# Patient Record
Sex: Female | Born: 1985 | Race: Black or African American | Hispanic: No | Smoking: Never smoker
Health system: Southern US, Community
[De-identification: ages and names within clinical notes are randomized; demographics above are authoritative.]

## PROBLEM LIST (undated history)

## (undated) ENCOUNTER — Ambulatory Visit: Admission: EM

## (undated) ENCOUNTER — Inpatient Hospital Stay (HOSPITAL_COMMUNITY): Payer: Self-pay

## (undated) DIAGNOSIS — K567 Ileus, unspecified: Secondary | ICD-10-CM

## (undated) DIAGNOSIS — N390 Urinary tract infection, site not specified: Secondary | ICD-10-CM

## (undated) DIAGNOSIS — A749 Chlamydial infection, unspecified: Secondary | ICD-10-CM

## (undated) DIAGNOSIS — Z3490 Encounter for supervision of normal pregnancy, unspecified, unspecified trimester: Secondary | ICD-10-CM

## (undated) HISTORY — PX: ABDOMINAL SURGERY: SHX537

---

## 2006-07-19 ENCOUNTER — Emergency Department (HOSPITAL_COMMUNITY): Admission: EM | Admit: 2006-07-19 | Discharge: 2006-07-19 | Payer: Self-pay | Admitting: Family Medicine

## 2008-07-02 ENCOUNTER — Emergency Department (HOSPITAL_COMMUNITY): Admission: EM | Admit: 2008-07-02 | Discharge: 2008-07-02 | Payer: Self-pay | Admitting: Family Medicine

## 2009-01-12 ENCOUNTER — Emergency Department (HOSPITAL_COMMUNITY): Admission: EM | Admit: 2009-01-12 | Discharge: 2009-01-12 | Payer: Self-pay | Admitting: Family Medicine

## 2009-04-02 ENCOUNTER — Inpatient Hospital Stay (HOSPITAL_COMMUNITY): Admission: AD | Admit: 2009-04-02 | Discharge: 2009-04-04 | Payer: Self-pay | Admitting: Obstetrics

## 2009-04-02 ENCOUNTER — Encounter (INDEPENDENT_AMBULATORY_CARE_PROVIDER_SITE_OTHER): Payer: Self-pay | Admitting: Obstetrics

## 2010-08-16 ENCOUNTER — Emergency Department (HOSPITAL_COMMUNITY): Admission: EM | Admit: 2010-08-16 | Discharge: 2010-08-16 | Payer: Self-pay | Admitting: Emergency Medicine

## 2010-12-28 LAB — CBC
HCT: 31.9 % — ABNORMAL LOW (ref 36.0–46.0)
HCT: 36 % (ref 36.0–46.0)
Hemoglobin: 12.4 g/dL (ref 12.0–15.0)
MCHC: 33.8 g/dL (ref 30.0–36.0)
MCHC: 34.6 g/dL (ref 30.0–36.0)
MCV: 96.2 fL (ref 78.0–100.0)
MCV: 97.4 fL (ref 78.0–100.0)
Platelets: 203 10*3/uL (ref 150–400)
RDW: 13.4 % (ref 11.5–15.5)
RDW: 13.6 % (ref 11.5–15.5)
WBC: 12.6 10*3/uL — ABNORMAL HIGH (ref 4.0–10.5)

## 2011-06-22 LAB — GC/CHLAMYDIA PROBE AMP, GENITAL
Chlamydia, DNA Probe: NEGATIVE
GC Probe Amp, Genital: NEGATIVE

## 2011-06-22 LAB — POCT URINALYSIS DIP (DEVICE)
Glucose, UA: NEGATIVE
Glucose, UA: NEGATIVE
Hgb urine dipstick: NEGATIVE
Specific Gravity, Urine: 1.015
Specific Gravity, Urine: 1.015
Urobilinogen, UA: 1
Urobilinogen, UA: 1

## 2011-06-22 LAB — WET PREP, GENITAL: Trich, Wet Prep: NONE SEEN

## 2011-10-12 ENCOUNTER — Emergency Department (INDEPENDENT_AMBULATORY_CARE_PROVIDER_SITE_OTHER): Payer: Self-pay

## 2011-10-12 ENCOUNTER — Encounter (HOSPITAL_COMMUNITY): Payer: Self-pay | Admitting: *Deleted

## 2011-10-12 ENCOUNTER — Emergency Department (INDEPENDENT_AMBULATORY_CARE_PROVIDER_SITE_OTHER)
Admission: EM | Admit: 2011-10-12 | Discharge: 2011-10-12 | Disposition: A | Discharge status: Home / Self Care | Payer: Self-pay | Source: Home / Self Care | Attending: Emergency Medicine | Admitting: Emergency Medicine

## 2011-10-12 DIAGNOSIS — K5900 Constipation, unspecified: Secondary | ICD-10-CM

## 2011-10-12 DIAGNOSIS — R109 Unspecified abdominal pain: Secondary | ICD-10-CM

## 2011-10-12 HISTORY — DX: Ileus, unspecified: K56.7

## 2011-10-12 LAB — POCT URINALYSIS DIP (DEVICE)
Nitrite: NEGATIVE
Protein, ur: NEGATIVE mg/dL
Urobilinogen, UA: 1 mg/dL (ref 0.0–1.0)

## 2011-10-12 LAB — POCT PREGNANCY, URINE: Preg Test, Ur: NEGATIVE

## 2011-10-12 MED ORDER — GI COCKTAIL ~~LOC~~
30.0000 mL | Freq: Once | ORAL | Status: AC
  Administered 2011-10-12: 30 mL via ORAL

## 2011-10-12 MED ORDER — LACTULOSE 10 GM/15ML PO SOLN: 10.0000 g | Freq: Three times a day (TID) | ORAL | Status: AC

## 2011-10-12 MED ORDER — GI COCKTAIL ~~LOC~~
ORAL | Status: AC
  Filled 2011-10-12: qty 30

## 2011-10-12 NOTE — ED Provider Notes (Signed)
History     CSN: [id number]  Arrival date & time 10/12/11  1131   First MD Initiated Contact with Patient 10/12/11 1158      Chief Complaint  Patient presents with  . Chest Pain    (Consider location/radiation/quality/duration/timing/severity/associated sxs/prior treatment) HPI Comments: For 3 days feel "pressure and dullness " (poinst to LUQ area and L rib cage) NO COUGH AND NO sob, no fevers, no vomiting".   Have felt constipated, TRIED LAXATIVES YESTErDAY DID A BIT, STILL FEELS LIKE I NEED TO GO"  Patient is a 26 y.o. female presenting with chest pain. The history is provided by the patient.  Chest Pain The chest pain began 2 days ago. Chest pain occurs constantly. The chest pain is resolved. The pain is associated with coughing and lifting. At its most intense, the pain is at 7/10. The severity of the pain is moderate. The quality of the pain is described as aching and dull. The pain does not radiate. Chest pain is worsened by certain positions and deep breathing. Primary symptoms include abdominal pain. Pertinent negatives for primary symptoms include no fever, no fatigue, no shortness of breath, no cough, no nausea, no vomiting and no dizziness. Treatments tried: laxative.     Past Medical History  Diagnosis Date  . Ileus     Past Surgical History  Procedure Date  . Abdominal surgery     History reviewed. No pertinent family history.  History  Substance Use Topics  . Smoking status: Never Smoker   . Smokeless tobacco: Not on file  . Alcohol Use: No    OB History    Grav Para Term Preterm Abortions TAB SAB Ect Mult Living                  Review of Systems  Constitutional: Negative for fever and fatigue.  Respiratory: Negative for cough and shortness of breath.   Cardiovascular: Positive for chest pain.  Gastrointestinal: Positive for abdominal pain. Negative for nausea and vomiting.  Neurological: Negative for dizziness.    Allergies  Review of  patient's allergies indicates no known allergies.  Home Medications   Current Outpatient Rx  Name Route Sig Dispense Refill  . LACTULOSE 10 GM/15ML PO SOLN Oral Take 15 mLs (10 g total) by mouth 3 (three) times daily. 120 mL 0    BP 107/67  Pulse 86  Temp(Src) 98.3 F (36.8 C) (Oral)  Resp 16  SpO2 100%  LMP 09/22/2011  Physical Exam  Nursing note and vitals reviewed. Constitutional: She appears well-developed and well-nourished. No distress. She is not intubated.  HENT:  Head: Atraumatic.  Eyes: Conjunctivae are normal. No scleral icterus.  Pulmonary/Chest: Breath sounds normal. Accessory muscle usage present. Not tachypneic and not bradypneic. She is not intubated. No respiratory distress. She has no decreased breath sounds. She has no wheezes. She has no rhonchi. She has no rales.  Abdominal: Soft. Bowel sounds are normal. She exhibits no distension and no mass. There is tenderness. There is no rebound and no guarding.  Skin: No erythema.    ED Course  Procedures (including critical care time)  Labs Reviewed  POCT URINALYSIS DIP (DEVICE) - Abnormal; Notable for the following:    Hgb urine dipstick TRACE (*)    Leukocytes, UA LARGE (*) Biochemical Testing Only. Please order routine urinalysis from main lab if confirmatory testing is needed.   All other components within normal limits  POCT PREGNANCY, URINE  POCT PREGNANCY, URINE  POCT URINALYSIS DIPSTICK  URINE CULTURE   Dg Chest 2 View  10/12/2011  *RADIOLOGY REPORT*  Clinical Data: Pain  CHEST - 2 VIEW  Comparison: None  Findings: The heart size and mediastinal contours are within normal limits.  Both lungs are clear.  The visualized skeletal structures are unremarkable.  IMPRESSION: Negative exam.  Original Report Authenticated By: Rosealee Albee, M.D.   Dg Abd 1 View  10/12/2011  *RADIOLOGY REPORT*  Clinical Data: Abdominal pain  ABDOMEN - 1 VIEW  Comparison: None.  Findings: Moderate stool in the ascending colon.   No disproportionate dilatation of small bowel.  Phleboliths project over the left side of the pelvis.  Mild levoscoliosis with the apex at L3-4.  No obvious free intraperitoneal gas. 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  IMPRESSION: Nonobstructive bowel gas pattern.  Original Report Authenticated By: Donavan Burnet, M.D.     1. Abdominal pain   2. Constipation       MDM  Left sided abdominal discomfort- Denies urinary symptoms admits to feeling constipated for a few days tried over the counter laxative (can't remember ?) afebrile- soft abdomen, radiologically moderate stool burden-        Jimmie Molly, MD 10/12/11 1515

## 2011-10-12 NOTE — ED Notes (Signed)
Pt states no improvement from GI cocktail

## 2011-10-12 NOTE — ED Notes (Signed)
Pt reports 3 days  Of constant "soreness" of the lower sternum and lower anterior ribs.  She denies recent injury or  URI/coughing.   Massage helps the pain

## 2011-10-15 ENCOUNTER — Emergency Department (INDEPENDENT_AMBULATORY_CARE_PROVIDER_SITE_OTHER)
Admission: EM | Admit: 2011-10-15 | Discharge: 2011-10-15 | Disposition: A | Discharge status: Home / Self Care | Payer: Self-pay | Source: Home / Self Care | Attending: Emergency Medicine | Admitting: Emergency Medicine

## 2011-10-15 ENCOUNTER — Emergency Department (INDEPENDENT_AMBULATORY_CARE_PROVIDER_SITE_OTHER): Payer: Self-pay

## 2011-10-15 ENCOUNTER — Encounter (HOSPITAL_COMMUNITY): Payer: Self-pay

## 2011-10-15 DIAGNOSIS — R071 Chest pain on breathing: Secondary | ICD-10-CM

## 2011-10-15 DIAGNOSIS — R0789 Other chest pain: Secondary | ICD-10-CM

## 2011-10-15 MED ORDER — NAPROXEN 500 MG PO TABS: 500.0000 mg | ORAL_TABLET | Freq: Two times a day (BID) | ORAL | Status: DC

## 2011-10-15 NOTE — ED Notes (Signed)
Patient states she had a hard time sleeping last night, was tossing and turning. One time when she woke, she noted she was having pain in her chest w breathing. Denies any type of trauma to her chest, denies any pain or injury to her legs or calves. C/o only of pain in her chest w breathing; pain not increased w direct pressure to chest or  ROM upper extremities; NAD at present

## 2011-10-15 NOTE — ED Provider Notes (Signed)
History     CSN: [id number]  Arrival date & time 10/15/11  1516   First MD Initiated Contact with Patient 10/15/11 1532      Chief Complaint  Patient presents with  . Muscle Pain    (Consider location/radiation/quality/duration/timing/severity/associated sxs/prior treatment) HPI Comments: Patient with sharp, nonradiating, upper right-sided chest pain worse with deep inspiration. States this started last night at rest. Pain does not get worse with exertion, or position. No shortness of breath with exertion. No coughing, wheezing, nausea, vomiting, recent or remote history of trauma to the chest, rash. No recent change in physical activity. Patient said she took 400 mg ibuprofen last night with moderate relief. Patient is a right-handed female. Patient's recently seen in urgent care Center for chest/abdominal pain, however patient states that that has resolved. No recent h/o URI. No history of CAD, pneumothorax, PE, DVT.  Patient is a 26 y.o. female presenting with chest pain. The history is provided by the patient. No language interpreter was used.  Chest Pain The chest pain began 12 - 24 hours ago. The pain is associated with breathing. The quality of the pain is described as sharp. The pain does not radiate. Chest pain is worsened by certain positions and deep breathing. Pertinent negatives for primary symptoms include no fever, no fatigue, no syncope, no cough, no wheezing, no abdominal pain, no nausea, no vomiting and no dizziness.  Pertinent negatives for associated symptoms include no near-syncope. She tried NSAIDs for the symptoms.     Past Medical History  Diagnosis Date  . Ileus     Past Surgical History  Procedure Date  . Abdominal surgery     History reviewed. No pertinent family history.  History  Substance Use Topics  . Smoking status: Never Smoker   . Smokeless tobacco: Not on file  . Alcohol Use: No    OB History    Grav Para Term Preterm Abortions TAB SAB  Ect Mult Living                  Review of Systems  Constitutional: Negative for fever and fatigue.  Respiratory: Negative for cough and wheezing.   Cardiovascular: Positive for chest pain. Negative for syncope and near-syncope.  Gastrointestinal: Negative for nausea, vomiting and abdominal pain.  Musculoskeletal: Negative for back pain.  Skin: Negative for rash.  Neurological: Negative for dizziness.    Allergies  Review of patient's allergies indicates no known allergies.  Home Medications   Current Outpatient Rx  Name Route Sig Dispense Refill  . LACTULOSE 10 GM/15ML PO SOLN Oral Take 15 mLs (10 g total) by mouth 3 (three) times daily. 120 mL 0  . NAPROXEN 500 MG PO TABS Oral Take 1 tablet (500 mg total) by mouth 2 (two) times daily. 20 tablet 0    BP 115/73  Pulse 83  Temp(Src) 97.8 F (36.6 C) (Oral)  Resp 18  SpO2 100%  LMP 09/22/2011  Physical Exam  Nursing note and vitals reviewed. Constitutional: She is oriented to person, place, and time. She appears well-developed and well-nourished.  HENT:  Head: Normocephalic and atraumatic.  Eyes: Conjunctivae and EOM are normal.  Neck: Normal range of motion.  Cardiovascular: Normal rate, regular rhythm, normal heart sounds and intact distal pulses.   No murmur heard. Pulmonary/Chest: Effort normal and breath sounds normal. No respiratory distress. She has no wheezes. She has no rales. She exhibits no tenderness.       No chest wall tenderness. Pain with torso  rotation, lateral bending.  Abdominal: Soft. Bowel sounds are normal. She exhibits no distension and no mass. There is no tenderness. There is no rebound and no guarding.       Poor inspiratory effort secondary to pain  Musculoskeletal: Normal range of motion. She exhibits no edema and no tenderness.  Neurological: She is alert and oriented to person, place, and time.  Skin: Skin is warm and dry. No rash noted.  Psychiatric: She has a normal mood and affect. Her  behavior is normal. Judgment and thought content normal.    ED Course  Procedures (including critical care time)  Labs Reviewed - No data to display Dg Chest 2 View  10/15/2011  *RADIOLOGY REPORT*  Clinical Data: Right-sided chest pain for 24 hours.  Smokes and uses  drugs daily for 11 years.  CHEST - 2 VIEW  Comparison: 10/12/2011  Findings: A minimal pectus excavatum deformity. Midline trachea. Normal heart size and mediastinal contours. No pleural effusion or pneumothorax.  Clear lungs.  IMPRESSION: Normal chest.  Original Report Authenticated By: Consuello Bossier, M.D.     1. Right-sided chest wall pain      MDM  Previous chart reviewed.  Patient seen in urgent care 3 days ago for chest pain. Pain as described as achy and dull worse with certain positions and deep breathing. Patient also noted to have abdominal pain. Patient had abdominal x-ray which showed moderate stool burden. Patient thought to have constipation and sent home with stool softeners.  Luiz Blare, MD 10/15/11 [phone]

## 2011-10-20 ENCOUNTER — Emergency Department (HOSPITAL_COMMUNITY)
Admission: EM | Admit: 2011-10-20 | Discharge: 2011-10-20 | Disposition: A | Discharge status: Home / Self Care | Payer: Self-pay | Attending: Emergency Medicine | Admitting: Emergency Medicine

## 2011-10-20 ENCOUNTER — Other Ambulatory Visit: Payer: Self-pay

## 2011-10-20 ENCOUNTER — Emergency Department (HOSPITAL_COMMUNITY): Payer: Self-pay

## 2011-10-20 ENCOUNTER — Encounter (HOSPITAL_COMMUNITY): Payer: Self-pay | Admitting: *Deleted

## 2011-10-20 DIAGNOSIS — A599 Trichomoniasis, unspecified: Secondary | ICD-10-CM | POA: Insufficient documentation

## 2011-10-20 DIAGNOSIS — R0789 Other chest pain: Secondary | ICD-10-CM | POA: Insufficient documentation

## 2011-10-20 DIAGNOSIS — N939 Abnormal uterine and vaginal bleeding, unspecified: Secondary | ICD-10-CM

## 2011-10-20 DIAGNOSIS — N898 Other specified noninflammatory disorders of vagina: Secondary | ICD-10-CM | POA: Insufficient documentation

## 2011-10-20 LAB — POCT I-STAT, CHEM 8
BUN: 11 mg/dL (ref 6–23)
Calcium, Ion: 1.23 mmol/L (ref 1.12–1.32)
Chloride: 103 mEq/L (ref 96–112)
HCT: 36 % (ref 36.0–46.0)
Potassium: 3.7 mEq/L (ref 3.5–5.1)

## 2011-10-20 LAB — URINALYSIS, ROUTINE W REFLEX MICROSCOPIC
Bilirubin Urine: NEGATIVE
Glucose, UA: NEGATIVE mg/dL
Ketones, ur: NEGATIVE mg/dL
Leukocytes, UA: NEGATIVE
Specific Gravity, Urine: 1.027 (ref 1.005–1.030)
pH: 8 (ref 5.0–8.0)

## 2011-10-20 LAB — WET PREP, GENITAL

## 2011-10-20 LAB — POCT PREGNANCY, URINE: Preg Test, Ur: NEGATIVE

## 2011-10-20 MED ORDER — METRONIDAZOLE 500 MG PO TABS
2000.0000 mg | ORAL_TABLET | Freq: Once | ORAL | Status: AC
  Administered 2011-10-20: 2000 mg via ORAL
  Filled 2011-10-20: qty 4

## 2011-10-20 MED ORDER — IOHEXOL 300 MG/ML  SOLN
100.0000 mL | Freq: Once | INTRAMUSCULAR | Status: AC | PRN
  Administered 2011-10-20: 100 mL via INTRAVENOUS

## 2011-10-20 NOTE — ED Notes (Signed)
Multiple complaints. Reports having vaginal bleeding x 9 days with cramping, having constipation, no relief with otc meds. Having mild sob, no resp distress noted at triage, airway intact.

## 2011-10-20 NOTE — ED Provider Notes (Cosign Needed Addendum)
I saw and evaluated the patient, reviewed the resident's note and I agree with the findings and plan. Vag bleeding for 9 d.  Thinks menses does not think pregnant. Also constant left sided cp which is worse with deep breath for approx. 1 week. No cough, fever, + sob. No leg pain or swelling. No long trip, no ocps. No recent surgery. Smokes "weed" not cigarettes. No pmh.  Will check pregnancy test, do pelvic ( resident ) and check ddimer.  I doubt pe.    Nicholes Stairs, MD 10/20/11 1833  ED ECG REPORT   Date: 10/20/2011  EKG Time: 9:55 PM  Rate: 78  Rhythm: normal sinus rhythm,   Axis: nl  Intervals:none  ST&T Change: none  Narrative Interpretation: nsr            Nicholes Stairs, MD 10/20/11 2156

## 2011-10-20 NOTE — ED Provider Notes (Signed)
History     CSN: [id number]  Arrival date & time 10/20/11  1446   First MD Initiated Contact with Patient 10/20/11 1745      Chief Complaint  Patient presents with  . Vaginal Bleeding    (Consider location/radiation/quality/duration/timing/severity/associated sxs/prior treatment) Patient is a 26 y.o. female presenting with vaginal bleeding and chest pain. The history is provided by the patient.  Vaginal Bleeding This is a new problem. Episode onset: about 9 days ago. The problem occurs constantly. The problem has been unchanged. Associated symptoms include chest pain. Pertinent negatives include no abdominal pain, chills, congestion, coughing, diaphoresis, fatigue, fever, headaches, myalgias, nausea, neck pain, numbness, rash, sore throat, vertigo, vomiting or weakness. The symptoms are aggravated by nothing. She has tried nothing for the symptoms.  Chest Pain Episode onset: about 7 days ago. Chest pain occurs intermittently. The chest pain is unchanged. The pain is associated with breathing. The severity of the pain is mild. The quality of the pain is described as sharp. The pain does not radiate. Chest pain is worsened by deep breathing (not exertional). Primary symptoms include shortness of breath (mild with exertion). Pertinent negatives for primary symptoms include no fever, no fatigue, no cough, no wheezing, no palpitations, no abdominal pain, no nausea, no vomiting and no dizziness.  Pertinent negatives for associated symptoms include no claudication, no diaphoresis, no lower extremity edema, no numbness and no weakness.  Pertinent negatives for past medical history include no DVT, no PE and no seizures.     Past Medical History  Diagnosis Date  . Ileus     Past Surgical History  Procedure Date  . Abdominal surgery     History reviewed. No pertinent family history.  History  Substance Use Topics  . Smoking status: Never Smoker   . Smokeless tobacco: Not on file  .  Alcohol Use: No    OB History    Grav Para Term Preterm Abortions TAB SAB Ect Mult Living                  Review of Systems  Constitutional: Negative for fever, chills, diaphoresis, activity change, appetite change and fatigue.  HENT: Negative for congestion, sore throat, rhinorrhea, neck pain and neck stiffness.   Eyes: Negative for photophobia, redness and visual disturbance.  Respiratory: Positive for shortness of breath (mild with exertion). Negative for cough and wheezing.   Cardiovascular: Positive for chest pain. Negative for palpitations, claudication and leg swelling.  Gastrointestinal: Negative for nausea, vomiting, abdominal pain, diarrhea, constipation and blood in stool.  Genitourinary: Positive for vaginal bleeding. Negative for dysuria, urgency, frequency, hematuria, flank pain, vaginal discharge, difficulty urinating, genital sores, vaginal pain and pelvic pain.  Musculoskeletal: Negative for myalgias and back pain.  Skin: Negative for rash and wound.  Neurological: Negative for dizziness, vertigo, seizures, facial asymmetry, speech difficulty, weakness, light-headedness, numbness and headaches.  Psychiatric/Behavioral: Negative for confusion.  All other systems reviewed and are negative.    Allergies  Review of patient's allergies indicates no known allergies.  Home Medications   Current Outpatient Rx  Name Route Sig Dispense Refill  . NAPROXEN 500 MG PO TABS Oral Take 1 tablet (500 mg total) by mouth 2 (two) times daily. 20 tablet 0    BP 107/75  Pulse 77  Temp(Src) 98.1 F (36.7 C) (Oral)  Resp 16  SpO2 100%  LMP 10/11/2011  Physical Exam  Nursing note and vitals reviewed. Constitutional: She is oriented to person, place, and time. She appears  well-developed and well-nourished.  Non-toxic appearance. No distress.  HENT:  Head: Normocephalic and atraumatic.  Mouth/Throat: Oropharynx is clear and moist.  Eyes: Conjunctivae and EOM are normal. Pupils  are equal, round, and reactive to light. No scleral icterus.  Neck: Normal range of motion. Neck supple. No JVD present.  Cardiovascular: Normal rate, regular rhythm, normal heart sounds and intact distal pulses.   No murmur heard. Pulmonary/Chest: Effort normal and breath sounds normal. No respiratory distress. She has no wheezes. She has no rales.  Abdominal: Soft. Bowel sounds are normal. She exhibits no distension. There is no tenderness. There is no rebound and no guarding.  Genitourinary: Uterus normal. Cervix exhibits no motion tenderness and no friability. Right adnexum displays no mass, no tenderness and no fullness. Left adnexum displays no mass, no tenderness and no fullness.       Small amount of bleeding from cervical os. Otherwise normal pelvic exam.   Exam performed with female RN chaperone present.   Musculoskeletal: Normal range of motion.  Neurological: She is alert and oriented to person, place, and time. She has normal strength. No cranial nerve deficit. GCS eye subscore is 4. GCS verbal subscore is 5. GCS motor subscore is 6.  Skin: Skin is warm and dry. No rash noted. She is not diaphoretic.  Psychiatric: She has a normal mood and affect.    ED Course  Procedures (including critical care time)  Labs Reviewed  D-DIMER, QUANTITATIVE - Abnormal; Notable for the following:    D-Dimer, Quant 3.15 (*)    All other components within normal limits  POCT I-STAT, CHEM 8  POCT PREGNANCY, URINE  URINALYSIS, ROUTINE W REFLEX MICROSCOPIC  WET PREP, GENITAL  GC/CHLAMYDIA PROBE AMP, GENITAL   Ct Angio Chest W/cm &/or Wo Cm  10/20/2011  *RADIOLOGY REPORT*  Clinical Data: The shortness of breath.  Chest pain.  Elevated D- dimer.  CT ANGIOGRAPHY CHEST 10/20/2011:  Technique:  Multidetector CT imaging of the chest using the standard protocol during bolus administration of intravenous contrast. Multiplanar reconstructed images including MIPs were obtained and reviewed to evaluate the  vascular anatomy.  Contrast: OMNIPAQUE IOHEXOL 300 MG/ML IV.  Comparison: None.  Findings: Contrast opacification of the pulmonary arteries is moderate.  Respiratory motion blurred many of the images of the bases.  Overall, the study is of moderate to good diagnostic quality.  No filling defects within either main pulmonary artery or their central branches in either lung to suggest pulmonary embolism. Heart size normal.  No pericardial effusion.  No visible coronary artery calcification.  No visible atherosclerosis involving the thoracic or upper abdominal aorta.  Pulmonary parenchyma clear without evidence of airspace consolidation, interstitial disease, or parenchymal nodules or masses.  No pleural effusions.  Central airways patent without significant bronchial wall thickening.  No significant mediastinal, hilar, or axillary lymphadenopathy. Visualized thyroid gland unremarkable.  Visualized upper abdomen unremarkable for the early arterial phase of enhancement.  Bone window images unremarkable.  IMPRESSION:  1.  No evidence of pulmonary embolism. 2.  No acute cardiopulmonary disease.  Original Report Authenticated By: Arnell Sieving, M.D.     1. Vaginal bleeding   2. Trichomonas infection   3. Atypical chest pain       MDM  25yo AAF with PMH significant for intestinal obstruction as an infant but no other medical problems. She presents to the ED for two reasons. Mainly, she has had vaginal bleeding for the last 9 days up to 8 pads/day max but  has not used more than that. Secondly, she has had some URI symptoms about a week and a half ago and now has intermittent sharp chest pain with mild dyspnea. Patient is satting 100% on RA and has normal HR in 70s-80s on monitor during exam. No resp distress or increased WOB. EKG with normal sinus rhythm with slight diffuse ST elevation. Getting UA, UPT, hemoglobin, and d-dimer. Doubt PE but will r/o with dimer. Possible pericarditis.   Dimer  elevated. Pt notified. Ordering CT chest to r/o PE.   Wet prep pos for trich. Given 2g flagyl.   CTA chest neg for PE.   Vaginal bleeding mild. Will d/c with obgyn f/u. Discussed need for HIV and syphilis testing and tx of partner.        Verne Carrow, MD 10/20/11 2337

## 2011-10-20 NOTE — ED Notes (Signed)
Pt to ED for mult complaints; pt reports that for the past week she has been battling constipation, and came to ED; pt reports that they did an xray and was told that she has "stool up to chest" pt was given laxatives and was able to have BM, but was sent home with prescription and did not work; pt reports she went to pharmacy and got dulcolax and miralaxx and has had some relief but has been having "small hard pellets"; pt also reports that she has started to have chest tightness and SOB; pt reports that she is SOB all of the time; pt appears in NAD, and is able to have conversation with full sentences with no difficulties; pt also reports for the past 9 days she has had her period and is having heavy bleeding, dark blood; reports her periods normally lasts 5 days and has heavy bleeding for 2-3 days normally

## 2011-10-21 NOTE — ED Provider Notes (Signed)
I saw and evaluated the patient, reviewed the resident's note and I agree with the findings and plan.  Nicholes Stairs, MD 10/21/11 1517

## 2011-10-22 NOTE — ED Notes (Signed)
+   Gonorrhea + Chlamydia Chart sent to EDP office for review. 

## 2011-10-23 NOTE — ED Notes (Signed)
Pt notified . Rx called in to [facility] [phone]

## 2011-10-23 NOTE — ED Notes (Signed)
Azithromax 1g po x1 Suprax 400mg  po x1

## 2012-02-14 ENCOUNTER — Encounter (HOSPITAL_COMMUNITY): Payer: Self-pay | Admitting: Emergency Medicine

## 2012-02-14 ENCOUNTER — Emergency Department (HOSPITAL_COMMUNITY)
Admission: EM | Admit: 2012-02-14 | Discharge: 2012-02-14 | Disposition: A | Discharge status: Home Health | Payer: Self-pay | Attending: Emergency Medicine | Admitting: Emergency Medicine

## 2012-02-14 DIAGNOSIS — R109 Unspecified abdominal pain: Secondary | ICD-10-CM | POA: Insufficient documentation

## 2012-02-14 DIAGNOSIS — K5900 Constipation, unspecified: Secondary | ICD-10-CM | POA: Insufficient documentation

## 2012-02-14 DIAGNOSIS — R300 Dysuria: Secondary | ICD-10-CM | POA: Insufficient documentation

## 2012-02-14 DIAGNOSIS — M549 Dorsalgia, unspecified: Secondary | ICD-10-CM | POA: Insufficient documentation

## 2012-02-14 LAB — URINALYSIS, ROUTINE W REFLEX MICROSCOPIC
Bilirubin Urine: NEGATIVE
Glucose, UA: NEGATIVE mg/dL
Hgb urine dipstick: NEGATIVE
Ketones, ur: NEGATIVE mg/dL
Leukocytes, UA: NEGATIVE
Protein, ur: NEGATIVE mg/dL
pH: 6.5 (ref 5.0–8.0)

## 2012-02-14 LAB — WET PREP, GENITAL
Trich, Wet Prep: NONE SEEN
Yeast Wet Prep HPF POC: NONE SEEN

## 2012-02-14 MED ORDER — DICYCLOMINE HCL 10 MG PO CAPS
20.0000 mg | ORAL_CAPSULE | Freq: Once | ORAL | Status: AC
  Administered 2012-02-14: 20 mg via ORAL
  Filled 2012-02-14: qty 2

## 2012-02-14 MED ORDER — POLYETHYLENE GLYCOL 3350 17 GM/SCOOP PO POWD: 17.0000 g | Freq: Every day | ORAL | Status: AC

## 2012-02-14 MED ORDER — DICYCLOMINE HCL 20 MG PO TABS: 20.0000 mg | ORAL_TABLET | Freq: Four times a day (QID) | ORAL | Status: DC | PRN

## 2012-02-14 MED ORDER — DOCUSATE SODIUM 100 MG PO CAPS: 100.0000 mg | ORAL_CAPSULE | Freq: Two times a day (BID) | ORAL | Status: AC

## 2012-02-14 NOTE — Discharge Instructions (Signed)

## 2012-02-14 NOTE — ED Provider Notes (Signed)
History     CSN: [id number]  Arrival date & time 02/14/12  1441   First MD Initiated Contact with Patient 02/14/12 1600      Chief Complaint  Patient presents with  . Abdominal Pain  . Back Pain    (Consider location/radiation/quality/duration/timing/severity/associated sxs/prior treatment) HPI Pt reports she has had several hours of intermittent diffuse cramping lower abdominal pain. 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 She reports some recent constipation, but no recent laxative use. She denies any vomiting, no diarrhea, no vaginal bleeding or discharge. She has had some mild dysuria, but no hematuria, pyuria. No fevers. She was seen in the ED about 4 months ago and found to have trichomonas. Treated with PO flagyl in the ED.   Past Medical History  Diagnosis Date  . Ileus     Past Surgical History  Procedure Date  . Abdominal surgery     History reviewed. No pertinent family history.  History  Substance Use Topics  . Smoking status: Never Smoker   . Smokeless tobacco: Not on file  . Alcohol Use: No    OB History    Grav Para Term Preterm Abortions TAB SAB Ect Mult Living                  Review of Systems All other systems reviewed and are negative except as noted in HPI.   Allergies  Review of patient's allergies indicates no known allergies.  Home Medications   Current Outpatient Rx  Name Route Sig Dispense Refill  . ALLEGRA ALLERGY PO Oral Take 1 tablet by mouth daily.    Marland Kitchen NAPHAZOLINE HCL 0.012 % OP SOLN Both Eyes Place 1 drop into both eyes 2 (two) times daily as needed. For dry eyes      BP 119/71  Pulse 84  Temp(Src) 98.4 F (36.9 C) (Oral)  Resp 18  SpO2 100%  Physical Exam  Nursing note and vitals reviewed. Constitutional: She is oriented to person, place, and time. She appears well-developed and well-nourished.  HENT:  Head: Normocephalic and atraumatic.  Eyes: EOM are  normal. Pupils are equal, round, and reactive to light.  Neck: Normal range of motion. Neck supple.  Cardiovascular: Normal rate, normal heart sounds and intact distal pulses.   Pulmonary/Chest: Effort normal and breath sounds normal.  Abdominal: Soft. She exhibits no distension. There is no tenderness. There is no rebound and no guarding.       Bowel sounds hypoactive  Genitourinary: Vagina normal and uterus normal. No vaginal discharge found.       No bleeding, no adnexal tenderness, chaperone present  Musculoskeletal: Normal range of motion. She exhibits no edema and no tenderness.  Neurological: She is alert and oriented to person, place, and time. She has normal strength. No cranial nerve deficit or sensory deficit.  Skin: Skin is warm and dry. No rash noted.  Psychiatric: She has a normal mood and affect.    ED Course  Procedures (including critical care time)  Labs Reviewed  WET PREP, GENITAL - Abnormal; Notable for the following:    Clue Cells Wet Prep HPF POC RARE (*)    WBC, Wet Prep HPF POC RARE (*)    All other components within normal limits  URINALYSIS, ROUTINE W REFLEX MICROSCOPIC  POCT PREGNANCY, URINE  GC/CHLAMYDIA PROBE AMP, GENITAL   No results found.   No diagnosis found.    MDM  PT with nonspecific abdominal pain, abdomen is benign, no recurrence of Trich. Will d/c with miralax, colace and bentyl.         Lyanne Kates B. Bernette Mayers, MD 02/14/12 1712

## 2012-02-14 NOTE — ED Notes (Signed)
Pt c/o lower abd and lower back pain; pt denies UTI sx or abnormal vaginal discharge; pt sts some diarrhea since yesterday

## 2012-02-14 NOTE — ED Notes (Signed)
Pt called requesting work excuse.  Pt states she was seen here today, however records indicate no visit since January, 2013.  Pt made aware.

## 2012-02-15 LAB — GC/CHLAMYDIA PROBE AMP, GENITAL: Chlamydia, DNA Probe: NEGATIVE

## 2012-10-29 ENCOUNTER — Emergency Department (HOSPITAL_COMMUNITY)
Admission: EM | Admit: 2012-10-29 | Discharge: 2012-10-29 | Disposition: A | Discharge status: Home Health | Payer: Self-pay | Attending: Emergency Medicine | Admitting: Emergency Medicine

## 2012-10-29 ENCOUNTER — Encounter (HOSPITAL_COMMUNITY): Payer: Self-pay | Admitting: *Deleted

## 2012-10-29 ENCOUNTER — Emergency Department (HOSPITAL_COMMUNITY): Payer: Self-pay

## 2012-10-29 DIAGNOSIS — Z8719 Personal history of other diseases of the digestive system: Secondary | ICD-10-CM | POA: Insufficient documentation

## 2012-10-29 DIAGNOSIS — O039 Complete or unspecified spontaneous abortion without complication: Secondary | ICD-10-CM | POA: Insufficient documentation

## 2012-10-29 LAB — CBC
MCV: 91.7 fL (ref 78.0–100.0)
Platelets: 283 10*3/uL (ref 150–400)
RDW: 13.1 % (ref 11.5–15.5)
WBC: 5.5 10*3/uL (ref 4.0–10.5)

## 2012-10-29 LAB — WET PREP, GENITAL
Trich, Wet Prep: NONE SEEN
Yeast Wet Prep HPF POC: NONE SEEN

## 2012-10-29 LAB — URINE MICROSCOPIC-ADD ON

## 2012-10-29 LAB — URINALYSIS, ROUTINE W REFLEX MICROSCOPIC
Ketones, ur: 15 mg/dL — AB
Nitrite: NEGATIVE
Protein, ur: 100 mg/dL — AB
Urobilinogen, UA: 1 mg/dL (ref 0.0–1.0)
pH: 6 (ref 5.0–8.0)

## 2012-10-29 MED ORDER — HYDROCODONE-ACETAMINOPHEN 5-325 MG PO TABS: ORAL_TABLET | ORAL | Status: DC

## 2012-10-29 MED ORDER — IBUPROFEN 600 MG PO TABS: 600.0000 mg | ORAL_TABLET | Freq: Four times a day (QID) | ORAL | Status: DC | PRN

## 2012-10-29 NOTE — ED Notes (Signed)
Lab called.  Beta quant machine is down for daily maintenance.  Lab staff states it will be approximately 45 minutes before machine is back up.

## 2012-10-29 NOTE — ED Notes (Signed)
UA obtained and sent. Pt amb to BR without problem. Family member at bedside

## 2012-10-29 NOTE — ED Notes (Signed)
Patient transported to Ultrasound 

## 2012-10-29 NOTE — ED Provider Notes (Signed)
History     CSN: [id number]  Arrival date & time 10/29/12  0458   First MD Initiated Contact with Patient 10/29/12 0602      CC: vaginal bleeding  (Consider location/radiation/quality/duration/timing/severity/associated sxs/prior treatment) HPI Comments: Patient presents with onset of vaginal bleeding, including the passage of clots, approximately one hour prior to arrival. Patient states that she has been changing her pad every 20 minutes. Patient's last menstrual period was in December. She had a positive pregnancy test this morning. She was not aware that she was pregnant. Patient denies abdominal pain or cramping. She has not had any fevers, nausea or vomiting. She's had no diarrhea. No urinary symptoms. Patient had a "surgery on my intestines" before age 89. No other surgeries. No treatments prior to arrival. Onset of symptoms acute. Course is constant. Nothing makes symptoms better worse.  The history is provided by the patient.    Past Medical History  Diagnosis Date  . Ileus     Past Surgical History  Procedure Laterality Date  . Abdominal surgery      No family history on file.  History  Substance Use Topics  . Smoking status: Never Smoker   . Smokeless tobacco: Not on file  . Alcohol Use: No    OB History   Grav Para Term Preterm Abortions TAB SAB Ect Mult Living                  Review of Systems  Constitutional: Negative for fever.  HENT: Negative for sore throat and rhinorrhea.   Eyes: Negative for redness.  Respiratory: Negative for cough.   Cardiovascular: Negative for chest pain.  Gastrointestinal: Negative for nausea, vomiting, abdominal pain and diarrhea.  Genitourinary: Positive for vaginal bleeding. Negative for dysuria and hematuria.  Musculoskeletal: Negative for myalgias.  Skin: Negative for rash.  Neurological: Negative for dizziness, syncope and headaches.    Allergies  Review of patient's allergies indicates no known allergies.  Home  Medications   Current Outpatient Rx  Name  Route  Sig  Dispense  Refill  . HYDROcodone-acetaminophen (NORCO/VICODIN) 5-325 MG per tablet      Take 1-2 tablets every 6 hours as needed for severe pain   8 tablet   0   . ibuprofen (ADVIL,MOTRIN) 600 MG tablet   Oral   Take 1 tablet (600 mg total) by mouth every 6 (six) hours as needed for pain.   20 tablet   0     BP 111/60  Pulse 85  Temp(Src) 98.4 F (36.9 C) (Oral)  Resp 16  SpO2 100%  LMP 09/03/2012  Physical Exam  Nursing note and vitals reviewed. Constitutional: She appears well-developed and well-nourished.  HENT:  Head: Normocephalic and atraumatic.  Eyes: Conjunctivae are normal. Right eye exhibits no discharge. Left eye exhibits no discharge.  Neck: Normal range of motion. Neck supple.  Cardiovascular: Normal rate, regular rhythm and normal heart sounds.   Pulmonary/Chest: Effort normal and breath sounds normal.  Abdominal: Soft. Bowel sounds are normal. There is no tenderness. There is no rebound and no guarding.  Genitourinary: Cervix exhibits discharge (blood). Cervix exhibits no motion tenderness and no friability. Right adnexum displays no mass, no tenderness and no fullness. Left adnexum displays no mass, no tenderness and no fullness. There is bleeding around the vagina. No erythema around the vagina.  Neurological: She is alert.  Skin: Skin is warm and dry.  Psychiatric: She has a normal mood and affect.    ED Course  Procedures (including critical care time)  Labs Reviewed  WET PREP, GENITAL - Abnormal; Notable for the following:    Clue Cells Wet Prep HPF POC FEW (*)    WBC, Wet Prep HPF POC FEW (*)    All other components within normal limits  URINALYSIS, ROUTINE W REFLEX MICROSCOPIC - Abnormal; Notable for the following:    Color, Urine RED (*)    APPearance TURBID (*)    Specific Gravity, Urine 1.031 (*)    Hgb urine dipstick LARGE (*)    Bilirubin Urine SMALL (*)    Ketones, ur 15 (*)     Protein, ur 100 (*)    Leukocytes, UA MODERATE (*)    All other components within normal limits  URINE MICROSCOPIC-ADD ON - Abnormal; Notable for the following:    Squamous Epithelial / LPF FEW (*)    Bacteria, UA MANY (*)    All other components within normal limits  HCG, QUANTITATIVE, PREGNANCY - Abnormal; Notable for the following:    hCG, Beta Chain, Quant, S 2109 (*)    All other components within normal limits  POCT PREGNANCY, URINE - Abnormal; Notable for the following:    Preg Test, Ur POSITIVE (*)    All other components within normal limits  URINE CULTURE  GC/CHLAMYDIA PROBE AMP  CBC  ABO/RH   US Ob Transvaginal  10/29/2012  *RADIOLOGY REPORT*  Clinical Data: Vaginal bleeding  TRANSVAGINAL OB ULTRASOUND  Technique:  Transvaginal ultrasound was performed for evaluation of the gestation as well as the maternal uterus and adnexal regions.  Comparison: None  Findings: There does appear to be an intrauterine gestational sac present containing an embryo within the lower uterine segment.  No cardiac activity is demonstrated.  In view of the location and lack of fetal cardiac activity, this finding is most consistent with impending spontaneous abortion.  By measurements of the crown rump length, the estimated gestational age is 7 weeks 4 days.  A small subchorionic hemorrhage is present. A small cyst is noted on the left ovary of 1.7 cm.  The right ovary is unremarkable.  No free fluid is seen.  IMPRESSION: Intrauterine gestational sac with fetal pole located within the lower uterine segment with no fetal cardiac activity.  Probable impending spontaneous AB.   Original Report Authenticated By: Dwyane Dee, M.D.      1. Miscarriage     6:09 AM Patient seen and examined. Will perform pelvic exam. She is not clinically anemic.    Vital signs reviewed and are as follows: Filed Vitals:   10/29/12 0502  BP: 111/60  Pulse: 85  Temp: 98.4 F (36.9 C)  Resp: 16   Patient d/w Dr. Manus Gunning.    10:31 AM [place] reviewed by myself. C/w impending spontaneous miscarriage. Pt informed.   She will f/u with [facility] clinic. Referral given. Questions answered.   Patient told to return with worsening bleeding, shortness of breath, if he passes out, has severe pain, or she has any other concerns. Will send home with pain medication.  Patient counseled on use of narcotic pain medications. Counseled not to combine these medications with others containing tylenol. Urged not to drink alcohol, drive, or perform any other activities that requires focus while taking these medications. The patient verbalizes understanding and agrees with the plan.    MDM  Impending spontaneous miscarriage confirmed on ultrasound. Patient has moderate bleeding, no signs of anemia. Referrals given. Patient appears well at time of discharge. Patient is Rh+.  Renne Crigler, [place] 10/29/12 1034

## 2012-10-29 NOTE — ED Notes (Signed)
Pelvic cart set up at bedside  

## 2012-10-29 NOTE — ED Provider Notes (Signed)
Medical screening examination/treatment/procedure(s) were performed by non-physician practitioner and as supervising physician I was immediately available for consultation/collaboration.   Glynn Octave, MD 10/29/12 1655

## 2012-10-29 NOTE — ED Notes (Signed)
Pt discharged to home with friend. NAD.  

## 2012-10-29 NOTE — ED Notes (Signed)
Passing blood clots from vaginal area. Started an ~ 1hr. ago

## 2012-10-29 NOTE — ED Notes (Signed)
Family at bedside. Pt resting quietly. Denies pain, states that she noticed the blood clots when she got up to go to bathroom in the middle of the night

## 2012-10-29 NOTE — ED Notes (Signed)
Pelvic exam completed by MD and ED Tech

## 2012-10-29 NOTE — ED Notes (Signed)
Pt returned from ultrasound

## 2012-10-29 NOTE — ED Notes (Signed)
Pt resting quietly. MD at bedside

## 2012-10-30 LAB — URINE CULTURE: Colony Count: NO GROWTH

## 2012-10-31 LAB — GC/CHLAMYDIA PROBE AMP: GC Probe RNA: POSITIVE — AB

## 2012-11-02 ENCOUNTER — Telehealth (HOSPITAL_COMMUNITY): Payer: Self-pay | Admitting: Emergency Medicine

## 2012-11-02 NOTE — ED Notes (Signed)
Positive for gonorrhea and chlamydia- no treatment- chart sent to edp office for review.

## 2012-11-05 ENCOUNTER — Telehealth (HOSPITAL_COMMUNITY): Payer: Self-pay | Admitting: Emergency Medicine

## 2012-12-10 ENCOUNTER — Telehealth (HOSPITAL_COMMUNITY): Payer: Self-pay | Admitting: Emergency Medicine

## 2012-12-10 NOTE — ED Notes (Signed)
No response to letter sent after 30 days. Chart sent to Medical Records. °

## 2013-02-01 ENCOUNTER — Emergency Department (HOSPITAL_COMMUNITY)
Admission: EM | Admit: 2013-02-01 | Discharge: 2013-02-01 | Disposition: A | Discharge status: Home / Self Care | Payer: Self-pay | Attending: Emergency Medicine | Admitting: Emergency Medicine

## 2013-02-01 ENCOUNTER — Emergency Department (HOSPITAL_COMMUNITY): Payer: Self-pay

## 2013-02-01 ENCOUNTER — Encounter (HOSPITAL_COMMUNITY): Payer: Self-pay | Admitting: Emergency Medicine

## 2013-02-01 DIAGNOSIS — X500XXA Overexertion from strenuous movement or load, initial encounter: Secondary | ICD-10-CM | POA: Insufficient documentation

## 2013-02-01 DIAGNOSIS — Y9389 Activity, other specified: Secondary | ICD-10-CM | POA: Insufficient documentation

## 2013-02-01 DIAGNOSIS — S93401A Sprain of unspecified ligament of right ankle, initial encounter: Secondary | ICD-10-CM

## 2013-02-01 DIAGNOSIS — W108XXA Fall (on) (from) other stairs and steps, initial encounter: Secondary | ICD-10-CM | POA: Insufficient documentation

## 2013-02-01 DIAGNOSIS — S93409A Sprain of unspecified ligament of unspecified ankle, initial encounter: Secondary | ICD-10-CM | POA: Insufficient documentation

## 2013-02-01 DIAGNOSIS — Y929 Unspecified place or not applicable: Secondary | ICD-10-CM | POA: Insufficient documentation

## 2013-02-01 MED ORDER — HYDROCODONE-ACETAMINOPHEN 5-325 MG PO TABS
1.0000 | ORAL_TABLET | Freq: Once | ORAL | Status: AC
  Administered 2013-02-01: 1 via ORAL
  Filled 2013-02-01: qty 1

## 2013-02-01 MED ORDER — HYDROCODONE-ACETAMINOPHEN 5-325 MG PO TABS: 1.0000 | ORAL_TABLET | ORAL | Status: DC | PRN

## 2013-02-01 NOTE — ED Provider Notes (Signed)
History     CSN: [id number]  Arrival date & time 02/01/13  4098   First MD Initiated Contact with Patient 02/01/13 [phone]      No chief complaint on file.   (Consider location/radiation/quality/duration/timing/severity/associated sxs/prior treatment) Patient is a 27 y.o. female presenting with ankle pain. The history is provided by the patient.  Ankle Pain Location:  Ankle Time since incident:  1 hour Injury: yes   Mechanism of injury: fall   Fall:    Fall occurred:  Down stairs (She missed a step, fell, twisting her right ankle.)   Impact surface:  [employer] of impact:  Feet   Entrapped after fall: no   Ankle location:  R ankle Pain details:    Quality:  Aching   Radiates to:  Does not radiate   Severity:  Mild   Onset quality:  Sudden   Duration:  1 hour   Progression:  Unchanged Chronicity:  New Dislocation: no   Foreign body present:  No foreign bodies Prior injury to area:  No Relieved by:  Nothing Worsened by:  Bearing weight Ineffective treatments:  None tried Associated symptoms comment:  None    Past Medical History  Diagnosis Date  . Ileus     Past Surgical History  Procedure Laterality Date  . Abdominal surgery      No family history on file.  History  Substance Use Topics  . Smoking status: Never Smoker   . Smokeless tobacco: Not on file  . Alcohol Use: No    OB History   Grav Para Term Preterm Abortions TAB SAB Ect Mult Living                  Review of Systems  All other systems reviewed and are negative.    Allergies  Review of patient's allergies indicates no known allergies.  Home Medications   Current Outpatient Rx  Name  Route  Sig  Dispense  Refill  . HYDROcodone-acetaminophen (NORCO/VICODIN) 5-325 MG per tablet      Take 1-2 tablets every 6 hours as needed for severe pain   8 tablet   0   . ibuprofen (ADVIL,MOTRIN) 600 MG tablet   Oral   Take 1 tablet (600 mg total) by mouth every 6 (six) hours as needed  for pain.   20 tablet   0     There were no vitals taken for this visit.  Physical Exam  Nursing note and vitals reviewed. Constitutional: She is oriented to person, place, and time. She appears well-developed and well-nourished.  In mild distress with pain in right ankle.  HENT:  Head: Normocephalic and atraumatic.  Right Ear: External ear normal.  Left Ear: External ear normal.  Poor dentition.  Eyes: Conjunctivae and EOM are normal. Pupils are equal, round, and reactive to light.  Neck: Normal range of motion. Neck supple.  Cardiovascular: Normal rate, regular rhythm and normal heart sounds.   Pulmonary/Chest: Effort normal and breath sounds normal. She exhibits no tenderness.  Abdominal: Soft. Bowel sounds are normal.  Musculoskeletal:  She has mild swelling over the right lateral malleolus.  There is no palpable deformity.  The skin is intact.  She has intact pulses, sensation and tendon function in the right foot.  Neurological: She is alert and oriented to person, place, and time.  No sensory or motor deficit.  Skin: Skin is warm and dry.  Psychiatric: She has a normal mood and affect. Her behavior is normal.  ED Course  Procedures (including critical care time)  7:47 AM Pt was seen and had physical examination. X-ray of right ankle ordered.  Pt did not want pain medication at present.  8:48 AM Pt has soft tissue swelling but no fracture on x-ray.  Rx hydrocodone and acetaminophen for pain, Crutches and ASO.  1. Sprain of right ankle, initial encounter           Carleene Cooper III, MD 02/01/13 [phone]

## 2013-02-01 NOTE — ED Notes (Signed)
Patient is alert and orientedx4.  Patient was explained discharge instructions and they understood them with no questions.  Patient is being taken home by her friend, Ferd Glassing.

## 2013-02-01 NOTE — ED Notes (Signed)
Patient said she was going down a set of stairs and she tripped and fell down the stairs.  She said she fell down 16 carpeted steps.  Kristen Santos is complaining of ankle pain and pain on her lip.  Her pain is a 10/10 in her ankle.  She advises she can feel her lip swelling but it does not hurt.  She decided to come in to the ED to be seen.

## 2013-08-17 ENCOUNTER — Encounter (HOSPITAL_COMMUNITY): Payer: Self-pay | Admitting: Emergency Medicine

## 2013-08-17 ENCOUNTER — Emergency Department (HOSPITAL_COMMUNITY): Payer: Self-pay

## 2013-08-17 ENCOUNTER — Emergency Department (HOSPITAL_COMMUNITY)
Admission: EM | Admit: 2013-08-17 | Discharge: 2013-08-17 | Disposition: A | Discharge status: Home / Self Care | Payer: Self-pay | Attending: Emergency Medicine | Admitting: Emergency Medicine

## 2013-08-17 DIAGNOSIS — Z8719 Personal history of other diseases of the digestive system: Secondary | ICD-10-CM | POA: Insufficient documentation

## 2013-08-17 DIAGNOSIS — J020 Streptococcal pharyngitis: Secondary | ICD-10-CM

## 2013-08-17 DIAGNOSIS — Z9889 Other specified postprocedural states: Secondary | ICD-10-CM | POA: Insufficient documentation

## 2013-08-17 DIAGNOSIS — Z3202 Encounter for pregnancy test, result negative: Secondary | ICD-10-CM | POA: Insufficient documentation

## 2013-08-17 DIAGNOSIS — J351 Hypertrophy of tonsils: Secondary | ICD-10-CM

## 2013-08-17 DIAGNOSIS — Z792 Long term (current) use of antibiotics: Secondary | ICD-10-CM | POA: Insufficient documentation

## 2013-08-17 LAB — BASIC METABOLIC PANEL
CO2: 28 mEq/L (ref 19–32)
Chloride: 100 mEq/L (ref 96–112)
Creatinine, Ser: 0.73 mg/dL (ref 0.50–1.10)
Potassium: 4.2 mEq/L (ref 3.5–5.1)

## 2013-08-17 LAB — RAPID STREP SCREEN (MED CTR MEBANE ONLY): Streptococcus, Group A Screen (Direct): POSITIVE — AB

## 2013-08-17 LAB — CBC
MCV: 89.8 fL (ref 78.0–100.0)
Platelets: 380 10*3/uL (ref 150–400)
RBC: 4.42 MIL/uL (ref 3.87–5.11)
WBC: 8.8 10*3/uL (ref 4.0–10.5)

## 2013-08-17 LAB — POCT PREGNANCY, URINE: Preg Test, Ur: NEGATIVE

## 2013-08-17 MED ORDER — CLINDAMYCIN HCL 300 MG PO CAPS
450.0000 mg | ORAL_CAPSULE | Freq: Once | ORAL | Status: AC
  Administered 2013-08-17: 13:00:00 450 mg via ORAL
  Filled 2013-08-17 (×2): qty 1

## 2013-08-17 MED ORDER — SODIUM CHLORIDE 0.9 % IV BOLUS (SEPSIS)
1000.0000 mL | Freq: Once | INTRAVENOUS | Status: AC
  Administered 2013-08-17: 1000 mL via INTRAVENOUS

## 2013-08-17 MED ORDER — CLINDAMYCIN HCL 300 MG PO CAPS: 450.0000 mg | ORAL_CAPSULE | Freq: Once | ORAL | Status: DC

## 2013-08-17 MED ORDER — HYDROCODONE-ACETAMINOPHEN 5-325 MG PO TABS: 1.0000 | ORAL_TABLET | Freq: Four times a day (QID) | ORAL | Status: DC | PRN

## 2013-08-17 MED ORDER — ONDANSETRON HCL 4 MG/2ML IJ SOLN
4.0000 mg | Freq: Once | INTRAMUSCULAR | Status: AC
  Administered 2013-08-17: 4 mg via INTRAVENOUS
  Filled 2013-08-17: qty 2

## 2013-08-17 MED ORDER — IOHEXOL 300 MG/ML  SOLN
100.0000 mL | Freq: Once | INTRAMUSCULAR | Status: AC | PRN
  Administered 2013-08-17: 75 mL via INTRAVENOUS

## 2013-08-17 MED ORDER — MORPHINE SULFATE 4 MG/ML IJ SOLN
4.0000 mg | Freq: Once | INTRAMUSCULAR | Status: AC
  Administered 2013-08-17: 4 mg via INTRAVENOUS
  Filled 2013-08-17: qty 1

## 2013-08-17 MED ORDER — CLINDAMYCIN HCL 150 MG PO CAPS: 450.0000 mg | ORAL_CAPSULE | Freq: Three times a day (TID) | ORAL | Status: DC

## 2013-08-17 NOTE — Discharge Instructions (Signed)
Please call and set up an appointment with Dr. Annalee Genta, ENT physician regarding infection and lymphadenopathy-enlarged tonsils Please take medication as prescribed While on pain medications there is to be no drinking alcohol, driving, operating any heavy machinery - if extra please dispose in a proper manner. While on the pain medications please do not take extra Tylenol for this can lead to overdose Strep is highly contagious  Please rest and stay hydrated Please apply warm compressions to the site to aid in reduction of swelling May take a couple of weeks for the swelling to go down, if in a couple of weeks swelling does not go down please report back to the ENT Please continue to monitor symptoms and if symptoms are to worsen or change (fever > 101, chills, neck pain, neck stiffness, worsening pain, chest pain, shortness of breath, difficulty breathing, worsening swelling, inability to open and close the mouth, inability to rotate the head, difficulty swallowing) please report back to the ED    Strep Throat Strep throat is an infection of the throat caused by a bacteria named Streptococcus pyogenes. Your caregiver may call the infection streptococcal "tonsillitis" or "pharyngitis" depending on whether there are signs of inflammation in the tonsils or back of the throat. Strep throat is most common in children aged 5 15 years during the cold months of the year, but it can occur in people of any age during any season. This infection is spread from person to person (contagious) through coughing, sneezing, or other close contact. SYMPTOMS   Fever or chills.  Painful, swollen, red tonsils or throat.  Pain or difficulty when swallowing.  White or yellow spots on the tonsils or throat.  Swollen, tender lymph nodes or "glands" of the neck or under the jaw.  Red rash all over the body (rare). DIAGNOSIS  Many different infections can cause the same symptoms. A test must be done to confirm the  diagnosis so the right treatment can be given. A "rapid strep test" can help your caregiver make the diagnosis in a few minutes. If this test is not available, a light swab of the infected area can be used for a throat culture test. If a throat culture test is done, results are usually available in a day or two. TREATMENT  Strep throat is treated with antibiotic medicine. HOME CARE INSTRUCTIONS   Gargle with 1 tsp of salt in 1 cup of warm water, 3 4 times per day or as needed for comfort.  Family members who also have a sore throat or fever should be tested for strep throat and treated with antibiotics if they have the strep infection.  Make sure everyone in your household washes their hands well.  Do not share food, drinking cups, or personal items that could cause the infection to spread to others.  You may need to eat a soft food diet until your sore throat gets better.  Drink enough water and fluids to keep your urine clear or pale yellow. This will help prevent dehydration.  Get plenty of rest.  Stay home from school, daycare, or work until you have been on antibiotics for 24 hours.  Only take over-the-counter or prescription medicines for pain, discomfort, or fever as directed by your caregiver.  If antibiotics are prescribed, take them as directed. Finish them even if you start to feel better. SEEK MEDICAL CARE IF:   The glands in your neck continue to enlarge.  You develop a rash, cough, or earache.  You cough  up green, yellow-brown, or bloody sputum.  You have pain or discomfort not controlled by medicines.  Your problems seem to be getting worse rather than better. SEEK IMMEDIATE MEDICAL CARE IF:   You develop any new symptoms such as vomiting, severe headache, stiff or painful neck, chest pain, shortness of breath, or trouble swallowing.  You develop severe throat pain, drooling, or changes in your voice.  You develop swelling of the neck, or the skin on the neck  becomes red and tender.  You have a fever.  You develop signs of dehydration, such as fatigue, dry mouth, and decreased urination.  You become increasingly sleepy, or you cannot wake up completely. Document Released: 09/03/2000 Document Revised: 08/23/2012 Document Reviewed: 11/05/2010 [facility] Patient Information 2014 [place], [place]. Viral and Bacterial Pharyngitis Pharyngitis is a sore throat. It is an infection of the back of the throat (pharynx). HOME CARE   Only take medicine as told by your doctor. You may get sick again if you do not take medicine as told.  Drink enough fluids to keep your pee (urine) clear or pale yellow.  Rest.  Rinse your mouth (gargle) with salt water ( teaspoon of salt in 8 ounces of water) every 1 to 2 hours. This will help the pain.  For children over the age of 7, suck on hard candy or sore throat lozenges. GET HELP RIGHT AWAY IF:   There are large, tender lumps in your neck.  You have a rash.  You cough up green, yellow-brown, or bloody mucus.  You have a stiff neck.  There is redness, puffiness (swelling), or very bad pain anywhere on the neck.  You drool or are unable to swallow liquids.  You throw up (vomit) or are not able to keep medicine or liquids down.  You have very bad pain that will not stop with medicine.  You have problems breathing (not from a stuffy nose).  You cannot open your mouth completely.  You or your child has a temperature by mouth above 102 F (38.9 C), not controlled by medicine.  Your baby is older than 3 months with a rectal temperature of 102 F (38.9 C) or higher.  Your baby is 57 months old or younger with a rectal temperature of 100.4 F (38 C) or higher. MAKE SURE YOU:   Understand these instructions.  Will watch this condition.  Will get help right away if you or your child is not doing well or gets worse. Document Released: 02/23/2008 Document Revised: 11/29/2011 Document Reviewed:  10/06/2009 [facility] Patient Information 2014 [place], [place].  RESOURCE GUIDE  Chronic Pain Problems: Contact [facility]  [phone] Patients need to be referred by their primary care doctor.  Insufficient Money for Medicine: Contact United Way:  call [phone]  No Primary Care Doctor: - Call Health Connect  [phone] - can help you locate a primary care doctor that  accepts your insurance, provides certain services, etc. - Physician Referral [phone]  Agencies that provide inexpensive medical care: - [facility]  [phone] - [facility]  [phone] - [facility]  [phone] - Women's Clinic  [phone] - Planned Parenthood  [phone] [facility]  [phone]  Medicaid-accepting [facility] Providers: - [facility]- [address]  [phone], Mon-Fri 9am-7pm, Sat 9am-1pm - [facility]- [address] [place], [address] [place]  [phone] - [facility]- [address], [phone] [facility]- [address]  [phone] - Renaye Rakers- [address] [place], [address], [phone]  Only accepts [place] patients after they have their name  applied to their card  Self Pay (no insurance) in [facility]: - Sickle Cell Patients - [facility]  [address] [place], [phone] - [facility]- [address] [place]  [phone]       [facility] [place]- [address]       -     [facility]- see information above (Speak to [employer] if you do not have insurance)       -  [facility]- [address],  [phone]       -  [facility]- [address], [phone]       -  Dr Julio Sicks-  [address], [place], [phone]       -  Urgent Medical and [facility] - [address], [phone]        -  [facility]- [address] [phone], also [address] [phone]       -     [facility]- [address] [place], [phone], 1st & 3rd Saturday         every month, 10am-1pm  -     [facility] and [facility]   [address] [place], [place].   Phone:  [phone], Fax:  [phone]. Hours of Operation:  9 am - 6 pm, M-F.  -     [facility]   [address], [place]   Phone: [phone], Fax: [phone]. Hours of Operation:  8:30 am - 5:30 pm, M-F.  [facility] [address] [place], [place] [address] [phone]  [facility] [address] [place], [place] [address] [phone]  1) Find a Doctor and Pay Out of Pocket Although you won't have to find out who is covered by your insurance plan, it is a good idea to ask around and get recommendations. You will then need to call the office and see if the doctor you have chosen will accept you as a new patient and what types of options they offer for patients who are self-pay. Some doctors offer discounts or will set up payment plans for their patients who do not have insurance, but you will need to ask so you aren't surprised when you get to your appointment.  2) Contact Your Local Health Department Not all health departments have doctors that can see patients for sick visits, but many do, so it is worth a call to see if yours does. If you don't know where your local health department is, you can check in your phone book. The CDC also has a tool to help you locate your state's health department, and many state websites also have listings of all of their local health departments.  3) Find a Walk-in Clinic If your illness is not likely to be very severe or complicated, you may want to try a walk in clinic. These are popping up all over the country in pharmacies, drugstores, and shopping centers. They're usually staffed by  nurse practitioners or physician assistants that have been trained to treat common illnesses and complaints. They're usually fairly  quick and inexpensive. However, if you have serious medical issues or chronic medical problems, these are probably not your best option  STD Testing - [facility] Department of [facility] [place], STD Clinic, [address] [place], phone [phone] or [phone].  Monday - Friday, call for an appointment. [facility] Department of [employer], STD Clinic, [address], [place], phone [phone] or [phone].  Monday - Friday, call for an appointment.  Abuse/Neglect: [facility] Child Abuse Hotline (479)017-5667 [facility] Child Abuse Hotline [phone] (After Hours)  Emergency Shelter:  [facility] 6410084588  Maternity Homes: - Room at the [place] [facility] [phone] - [facility] 3067241513  MRSA Hotline #:   601 491 8316  Dental Assistance If unable to pay or uninsured, contact:  [facility] to become qualified for the adult dental clinic.  Patients with Medicaid: [facility] [phone] [address], [phone] [address] [phone]  If unable to pay, or uninsured, contact [facility] [phone] in [place], [phone] in [facility]) to become qualified for the adult dental clinic  [facility] [address] [place], [place] [address] [phone] [email or web address]  Other Proofreader Services: - [facility]- [address] [place], [place], [place], [address], [phone], Ext. [phone], 2nd and 4th Thursday of the month at 6:30am.  10 clients each day by appointment, can sometimes see walk-in patients if someone does not show for an appointment. [facility]- [address] [place], [place], [address], [phone] - [facility] [address] [place], [place], [address], [phone] - [facility]- [phone] [facility]- [phone] [facility] Health [phone]       Behavioral Health Resources in the [facility]  Intensive Outpatient Programs: [facility]      [address] [place], [place] [phone] Both a day and evening program       [facility] Out[facility]     [address]        [place], [place] [address] [phone]         ADS: [facility] [address] [place] [phone]  [facility] Mental Health ACCESS LINE: 202-880-4859 or [phone] [address] [place], [place] [address] [email or web address]   Substance Abuse Resources: - [facility] Drug [facility]  (913)085-9523 - [facility] [phone] - [facility] [phone] Floydene Flock ([phone] - Residential & Outpatient Substance Abuse Program  [phone]  Psychological Services: [facility]  [phone] [facility] Services  ([phone] - [facility], [phone] [place]. [address] [place], ACCESS LINE: 9590171623 or [phone], [email or web address]  Mobile Crisis Teams:                                        Therapeutic Alternatives         Mobile Crisis Care Unit [phone]             [facility] [address] Ginette Otto [phone]                                         Interventionist  [facility] DeEsch [address] [place] [phone]  Self-Help/Support Groups: Mental Health Assoc. of [employer] of support groups (619)869-1695 (call for more info)  Narcotics Anonymous (NA) Caring Services [address] [place] - 2 meetings at this location  Residential Treatment Programs:  [facility]      [address]        [place]       [phone]         [facility] [address] [place] [address]78 [place], [place]  [address] [phone]  [facility]  [address] [place], [place] [address] [phone] Admissions: 8am-3pm M-F  [facility]     [address]        [place], [place] [address]       [phone]         [facility] [address] [place], [place] [phone]  [facility] [address] [place], [place] [phone]  [facility] - Intensive Outpatient      [address]     [place], [place]       [phone]         [facility] (Addiction Recovery Care [facility]oc.)     [address] [place], [place] [phone] or [phone]  Residential Treatment Services (RTS), Medicaid [address] [place], [place] [phone]  Fellowship [address]                                               [address] [place] [phone]  [facility] Resources: [phone]               General Therapy                                                Angie Fava, PhD        [address] [place], [place] [address]         [phone]   Insurance  [facility]   [address] [place], [place] [address] [phone]  [facility] [address] [place], [place] [address] [phone] Insurance/Medicaid/sponsorship through [facility] and Families                                              [address]                                        [place], [place] [address]    Therapy/tele-psych/case         [phone]          [facility] [address], [place]  [address]  Adolescent/group home/case management  [phone]                                           Creola Corn PhD       General  therapy       Insurance   [phone]         Dr. Lolly Mustache, Insurance, M-F [phone]  [facility] [place]  United [employer] [facility] Dept. [address]                 [address]         [address] [place] [address]  Blondell Reveal Phone:  [phone]                                  Phone:  [phone]                   Phone:  [phone]  [facility] Mental Health, [phone] - [facility] - [facility]- [phone]       -     [facility] in [place], [address],             [phone], Insurance  [place] Child Abuse Hotline 724-656-6673 or 517-204-1808 (After Hours)

## 2013-08-17 NOTE — ED Notes (Signed)
Pt comfortable with d/c and f/u instructions. Prescriptions x2. 

## 2013-08-17 NOTE — ED Notes (Signed)
Patient drank some ginger ale.  Stated she was fine.

## 2013-08-17 NOTE — ED Notes (Signed)
Pt reports 2 days ago she started to feel a sore the left of her neck, tried taking advil but not much relief. sts it sore progressively got bigger and more painful. Pt think she has a swollen gland. Speaking in full sentences. Denies sob. Nad, skin warm and dry, resp e/u.

## 2013-08-17 NOTE — ED Notes (Signed)
Pt returned from radiology.

## 2013-08-17 NOTE — ED Notes (Signed)
Contacted pharmacy about Clindamycin

## 2013-08-17 NOTE — ED Provider Notes (Signed)
CSN: [id number]     Arrival date & time 08/17/13  0831 History   First MD Initiated Contact with Patient 08/17/13 [phone]     Chief Complaint  Patient presents with  . Abscess   (Consider location/radiation/quality/duration/timing/severity/associated sxs/prior Treatment) The history is provided by the patient. No language interpreter was used.  Kristen Santos is a 27 y/o F with PMHx of ileus with abdominal surgery presenting to the ED with left sided neck pain that has been ongoing for the past 2 days. Patient reported that she noticed swelling to the left side of the neck that has not changed in size over the past couple of days. Patient reported that she has been experiencing a soreness sensation to the site. Reported that there is a soreness with palpation to the lesion on the left side of the neck, reported that the pain worsens with motion and swallowing. Patient reported that the pain stays on the left side of her neck without radiation. Patient reported that she has been using Advil and warm compressions with negative relief. Patient denied difficulty swallowing, chest pain, shortness of breath, difficulty breathing, nausea, vomiting, numbness, tingling, ear pain, drainage, dental pain, jaw pain, fevers, chills.  PCP none  Past Medical History  Diagnosis Date  . Ileus    Past Surgical History  Procedure Laterality Date  . Abdominal surgery     No family history on file. History  Substance Use Topics  . Smoking status: Never Smoker   . Smokeless tobacco: Not on file  . Alcohol Use: No   OB History   Grav Para Term Preterm Abortions TAB SAB Ect Mult Living                 Review of Systems  Constitutional: Negative for fever and chills.  Respiratory: Negative for chest tightness and shortness of breath.   Cardiovascular: Negative for chest pain.  Gastrointestinal: Negative for nausea, vomiting and diarrhea.  Musculoskeletal: Positive for neck pain (left sided neck pain).    Neurological: Negative for dizziness and weakness.  All other systems reviewed and are negative.    Allergies  Review of patient's allergies indicates no known allergies.  Home Medications   Current Outpatient Rx  Name  Route  Sig  Dispense  Refill  . ibuprofen (ADVIL,MOTRIN) 200 MG tablet   Oral   Take 600 mg by mouth once.         . clindamycin (CLEOCIN) 150 MG capsule   Oral   Take 3 capsules (450 mg total) by mouth 3 (three) times daily.   90 capsule   0   . HYDROcodone-acetaminophen (NORCO/VICODIN) 5-325 MG per tablet   Oral   Take 1 tablet by mouth every 6 (six) hours as needed.   5 tablet   0    BP 119/65  Pulse 81  Temp(Src) 97.9 F (36.6 C) (Oral)  Resp 14  SpO2 100%  LMP 08/03/2013 Physical Exam  Nursing note and vitals reviewed. Constitutional: She is oriented to person, place, and time. She appears well-developed and well-nourished. No distress.  HENT:  Head: Normocephalic and atraumatic.  Right Ear: External ear normal.  Left Ear: External ear normal.  Mouth/Throat: Oropharynx is clear and moist. No oropharyngeal exudate.  Negative facial swelling noted Poor dentition noted with numerous teeth missing and remaining teeth in decaying process. Negative gumline swelling, erythema, inflammation, sores, lesions, active drainage or bleeding noted. Negative trismus. Uvula midline, symmetrical elevation.  Negative swelling, erythema, inflammation, exudate, petechiae noted  to the posterior oropharynx and tonsils bilaterally.   Eyes: Conjunctivae and EOM are normal. Pupils are equal, round, and reactive to light. Right eye exhibits no discharge. Left eye exhibits no discharge.  Neck: Neck supple.    Decreased ROM with rotation of the head to left secondary to pain. Swelling localized to the left side of the neck - with pain upon palpation. Negative area of erythema, inflammation, warmth upon palpation. Negative active drainage noted.   Cardiovascular:  Normal rate, regular rhythm and normal heart sounds.  Exam reveals no friction rub.   No murmur heard. Pulses:      Radial pulses are 2+ on the right side, and 2+ on the left side.  Pulmonary/Chest: Effort normal and breath sounds normal. No respiratory distress. She has no wheezes. She has no rales. She exhibits no tenderness.  Musculoskeletal: Normal range of motion.  Lymphadenopathy:    She has cervical adenopathy.  Neurological: She is alert and oriented to person, place, and time. No cranial nerve deficit. She exhibits normal muscle tone. Coordination normal.  Cranial nerves III-XII grossly intact   Skin: Skin is warm and dry. No rash noted. She is not diaphoretic. No erythema.  Psychiatric: She has a normal mood and affect. Her behavior is normal. Thought content normal.    ED Course  Procedures (including critical care time)  11:39 AM This provider discussed labs and imaging results with patient in great detail. Discussed with patient possibility of lymphoma and that she will be referred to ENT for outpatient to follow-up patient understood and agreed. Clindamycin PO given to patient while in ED setting.   1:45 PM This provider re-assessed patient. Patient reported that she is doing well and feels better. Patient is non-toxic appearing. Patient has been able to tolerate fluids PO without difficulty. Discussed plan for discharge with patient. Patient understood and agreed to plan.  Results for orders placed during the hospital encounter of 08/17/13  RAPID STREP SCREEN      Result Value Range   Streptococcus, Group A Screen (Direct) POSITIVE (*) NEGATIVE  CBC      Result Value Range   WBC 8.8  4.0 - 10.5 K/uL   RBC 4.42  3.87 - 5.11 MIL/uL   Hemoglobin 13.7  12.0 - 15.0 g/dL   HCT 91.4  78.2 - 95.6 %   MCV 89.8  78.0 - 100.0 fL   MCH 31.0  26.0 - 34.0 pg   MCHC 34.5  30.0 - 36.0 g/dL   RDW 21.3  08.6 - 57.8 %   Platelets 380  150 - 400 K/uL  BASIC METABOLIC PANEL      Result  Value Range   Sodium 137  135 - 145 mEq/L   Potassium 4.2  3.5 - 5.1 mEq/L   Chloride 100  96 - 112 mEq/L   CO2 28  19 - 32 mEq/L   Glucose, Bld 91  70 - 99 mg/dL   BUN 11  6 - 23 mg/dL   Creatinine, Ser 4.69  0.50 - 1.10 mg/dL   Calcium 9.2  8.4 - 62.9 mg/dL   GFR calc non Af Amer >90  >90 mL/min   GFR calc Af Amer >90  >90 mL/min  POCT PREGNANCY, URINE      Result Value Range   Preg Test, Ur NEGATIVE  NEGATIVE   Ct Soft Tissue Neck W Contrast  08/17/2013   CLINICAL DATA:  Neck pain  EXAM: CT NECK WITH CONTRAST  TECHNIQUE: Multidetector  CT imaging of the neck was performed using the standard protocol following the bolus administration of intravenous contrast.  CONTRAST:  75mL OMNIPAQUE IOHEXOL 300 MG/ML  SOLN  COMPARISON:  None.  FINDINGS: Enlargement of the palatine tonsils bilaterally which are homogeneous in density and without abscess. Significant enlargement of the adenoids tissue. There is hypertrophy of the lingual tonsil.  Bilateral cervical adenopathy. Enlarged jugular nodes and posterior cervical lymph nodes bilaterally. Left level 2 node measures 21 mm. Right level 2 node measures 17 mm. Right upper cervical node measures 18 mm. Multiple additional enlarged nodes are seen bilaterally.  Parotid and submandibular glands are normal bilaterally. Paranasal sinuses are clear. Thyroid is normal. Lung apices are clear.  Extensive dental infection.  IMPRESSION: Marked enlargement of the adenoid and tonsillar lymphoid tissue bilaterally. This is most likely due to infection. No abscess is seen.  Bilateral cervical adenopathy.  Lymphoma is in the differential and biopsy is suggested if these do not resolve with antibiotic treatment.  Extensive dental infection.  .   Electronically Signed   By: Marlan Palau M.D.   On: 08/17/2013 10:47    Labs Review Labs Reviewed  RAPID STREP SCREEN - Abnormal; Notable for the following:    Streptococcus, Group A Screen (Direct) POSITIVE (*)    All other  components within normal limits  CBC  BASIC METABOLIC PANEL  POCT PREGNANCY, URINE   Imaging Review Ct Soft Tissue Neck W Contrast  08/17/2013   CLINICAL DATA:  Neck pain  EXAM: CT NECK WITH CONTRAST  TECHNIQUE: Multidetector CT imaging of the neck was performed using the standard protocol following the bolus administration of intravenous contrast.  CONTRAST:  75mL OMNIPAQUE IOHEXOL 300 MG/ML  SOLN  COMPARISON:  None.  FINDINGS: Enlargement of the palatine tonsils bilaterally which are homogeneous in density and without abscess. Significant enlargement of the adenoids tissue. There is hypertrophy of the lingual tonsil.  Bilateral cervical adenopathy. Enlarged jugular nodes and posterior cervical lymph nodes bilaterally. Left level 2 node measures 21 mm. Right level 2 node measures 17 mm. Right upper cervical node measures 18 mm. Multiple additional enlarged nodes are seen bilaterally.  Parotid and submandibular glands are normal bilaterally. Paranasal sinuses are clear. Thyroid is normal. Lung apices are clear.  Extensive dental infection.  IMPRESSION: Marked enlargement of the adenoid and tonsillar lymphoid tissue bilaterally. This is most likely due to infection. No abscess is seen.  Bilateral cervical adenopathy.  Lymphoma is in the differential and biopsy is suggested if these do not resolve with antibiotic treatment.  Extensive dental infection.  .   Electronically Signed   By: Marlan Palau M.D.   On: 08/17/2013 10:47    EKG Interpretation   None       MDM   1. Strep pharyngitis   2. Tonsillar enlargement     Medications  sodium chloride 0.9 % bolus 1,000 mL (0 mLs Intravenous Stopped 08/17/13 1137)  iohexol (OMNIPAQUE) 300 MG/ML solution 100 mL (75 mLs Intravenous Contrast Given 08/17/13 1027)  morphine 4 MG/ML injection 4 mg (4 mg Intravenous Given 08/17/13 1102)  ondansetron (ZOFRAN) injection 4 mg (4 mg Intravenous Given 08/17/13 1101)  clindamycin (CLEOCIN) capsule 450 mg (450  mg Oral Given 08/17/13 1255)   Filed Vitals:   08/17/13 1130 08/17/13 1221 08/17/13 1230 08/17/13 1328  BP: 112/68 117/72 117/72 119/65  Pulse: 76 70 71 81  Temp:  97.9 F (36.6 C)  97.9 F (36.6 C)  TempSrc:  Oral  Oral  Resp:  14  14  SpO2: 100% 100% 100% 100%    Patient presenting to the ED with left-sided neck swelling that has been ongoing for the past 2 days. Patient reported that the swelling has remained the same, reported no change in size. Reported that she was experiencing a mild sore throat a couple of days ago, which has improved. Alert and oriented. Left sided neck swelling noted - localized to just below the left ear and post-auricular region. Negative erythema, warmth upon palpation, inflammation. Negative trismus. Poor dentition noted - no sign of acute dental infection noted. Discomfort with rotation of the head to the left. Negative meningeal signs. Uvula midline with symmetrical elevation. Full ROM to upper and lower extremities bilaterally. Unremarkable oral exam.  Urine pregnancy negative. CBC negative elevation in WBC. BMP negative findings noted. Rapid strep test positive. CT soft tissue noted enlarged adenoid and tonsillar lymphoid tissue bilaterally, most likely due to infection - negative findings of abscess. Bilateral cervical adenopathy noted.  Doubt peritonsillar abscess. Doubt Ludwig's angina. Doubt abscess to the neck. Doubt mastoiditis. Suspicion to be strep pharyngitis with tonsillar adenopathy. Pain controlled in ED setting and antibiotics started in ED setting. Patient tolerated fluids PO well. Patient stable, afebrile. Negative meningeal signs noted. Patient non-toxic appearing. Discharged patient with antibiotics and pain medications - discussed with patient course, precautions, disposal technique. Discussed with patient to rest and stay hydrated. Referred patient to ENT. Discussed with patient to closely monitor symptoms and if symptoms are to worsen or change  - educated patient on what to watch out for - to report back to the ED - strict return instructions given.  Patient agreed to plan of care, understood, all questions answered.    Raymon Mutton, PA-C 08/18/13 1826

## 2013-08-23 NOTE — ED Provider Notes (Signed)
Medical screening examination/treatment/procedure(s) were performed by non-physician practitioner and as supervising physician I was immediately available for consultation/collaboration.  Judithann Villamar L Caetano Oberhaus, MD 08/23/13 0736 

## 2013-09-10 ENCOUNTER — Inpatient Hospital Stay: Payer: Self-pay | Admitting: Internal Medicine

## 2013-11-05 ENCOUNTER — Encounter (HOSPITAL_COMMUNITY): Payer: Self-pay | Admitting: Emergency Medicine

## 2013-11-05 ENCOUNTER — Emergency Department (HOSPITAL_COMMUNITY)
Admission: EM | Admit: 2013-11-05 | Discharge: 2013-11-05 | Disposition: A | Discharge status: Home / Self Care | Payer: Self-pay | Attending: Emergency Medicine | Admitting: Emergency Medicine

## 2013-11-05 DIAGNOSIS — R221 Localized swelling, mass and lump, neck: Secondary | ICD-10-CM

## 2013-11-05 DIAGNOSIS — Z792 Long term (current) use of antibiotics: Secondary | ICD-10-CM | POA: Insufficient documentation

## 2013-11-05 DIAGNOSIS — K029 Dental caries, unspecified: Secondary | ICD-10-CM | POA: Insufficient documentation

## 2013-11-05 DIAGNOSIS — K089 Disorder of teeth and supporting structures, unspecified: Secondary | ICD-10-CM | POA: Insufficient documentation

## 2013-11-05 DIAGNOSIS — K0889 Other specified disorders of teeth and supporting structures: Secondary | ICD-10-CM

## 2013-11-05 DIAGNOSIS — R220 Localized swelling, mass and lump, head: Secondary | ICD-10-CM | POA: Insufficient documentation

## 2013-11-05 MED ORDER — PENICILLIN V POTASSIUM 500 MG PO TABS: 500.0000 mg | ORAL_TABLET | Freq: Four times a day (QID) | ORAL | Status: DC

## 2013-11-05 MED ORDER — TRAMADOL HCL 50 MG PO TABS
50.0000 mg | ORAL_TABLET | Freq: Once | ORAL | Status: AC
  Administered 2013-11-05: 50 mg via ORAL
  Filled 2013-11-05: qty 1

## 2013-11-05 MED ORDER — PENICILLIN V POTASSIUM 250 MG PO TABS
500.0000 mg | ORAL_TABLET | Freq: Once | ORAL | Status: AC
Start: 2013-11-05 — End: 2013-11-05
  Administered 2013-11-05: 500 mg via ORAL
  Filled 2013-11-05: qty 2

## 2013-11-05 MED ORDER — TRAMADOL HCL 50 MG PO TABS: 50.0000 mg | ORAL_TABLET | Freq: Four times a day (QID) | ORAL | Status: DC | PRN

## 2013-11-05 NOTE — ED Provider Notes (Signed)
CSN: [id number]     Arrival date & time 11/05/13  1033 History  This chart was scribed for non-physician practitioner, Emilia Beck, PA-C working with Gavin Pound. Oletta Lamas, MD by Greggory Stallion, ED scribe. This patient was seen in room TR06C/TR06C and the patient's care was started at 11:31 AM.   Chief Complaint  Patient presents with  . Abscess   The history is provided by the patient. No language interpreter was used.   HPI Comments: Kristen Santos is a 28 y.o. female who presents to the Emergency Department complaining of sudden onset, constant left upper dental pain with associated swelling that started yesterday. She states she has a broken tooth and thinks there is now a dental abscess. Pt has taken Advil with no relief.   Past Medical History  Diagnosis Date  . Ileus    Past Surgical History  Procedure Laterality Date  . Abdominal surgery     No family history on file. History  Substance Use Topics  . Smoking status: Never Smoker   . Smokeless tobacco: Not on file  . Alcohol Use: No   OB History   Grav Para Term Preterm Abortions TAB SAB Ect Mult Living                 Review of Systems  Constitutional: Negative for fever, chills and fatigue.  HENT: Positive for dental problem and facial swelling. Negative for trouble swallowing.   Eyes: Negative for visual disturbance.  Respiratory: Negative for shortness of breath.   Cardiovascular: Negative for chest pain and palpitations.  Gastrointestinal: Negative for nausea, vomiting, abdominal pain and diarrhea.  Genitourinary: Negative for dysuria and difficulty urinating.  Musculoskeletal: Negative for arthralgias and neck pain.  Skin: Negative for color change.  Neurological: Negative for dizziness and weakness.  Psychiatric/Behavioral: Negative for dysphoric mood.  All other systems reviewed and are negative.   Allergies  Review of patient's allergies indicates no known allergies.  Home Medications   Current  Outpatient Rx  Name  Route  Sig  Dispense  Refill  . clindamycin (CLEOCIN) 150 MG capsule   Oral   Take 3 capsules (450 mg total) by mouth 3 (three) times daily.   90 capsule   0   . HYDROcodone-acetaminophen (NORCO/VICODIN) 5-325 MG per tablet   Oral   Take 1 tablet by mouth every 6 (six) hours as needed.   5 tablet   0   . ibuprofen (ADVIL,MOTRIN) 200 MG tablet   Oral   Take 600 mg by mouth once.          BP 110/74  Pulse 80  Temp(Src) 97.8 F (36.6 C) (Oral)  Resp 18  Wt 180 lb (81.647 kg)  SpO2 100%  Physical Exam  Nursing note and vitals reviewed. Constitutional: She is oriented to person, place, and time. She appears well-developed and well-nourished. No distress.  HENT:  Head: Normocephalic and atraumatic.  Mouth/Throat: Oropharynx is clear and moist. No oropharyngeal exudate.  Poor dentition. Multiple decayed and missing teeth. Left maxillary and cheek tenderness to palpation with associated swelling. No drainable abscess identified.   Eyes: Conjunctivae and EOM are normal.  Neck: Neck supple. No tracheal deviation present.  Cardiovascular: Normal rate.   Pulmonary/Chest: Effort normal. No respiratory distress.  Musculoskeletal: Normal range of motion.  Neurological: She is alert and oriented to person, place, and time.  Skin: Skin is warm and dry.  Psychiatric: She has a normal mood and affect. Her behavior is normal.  ED Course  Procedures (including critical care time)  DIAGNOSTIC STUDIES: Oxygen Saturation is 100% on RA, normal by my interpretation.    COORDINATION OF CARE: 11:31 AM-Discussed treatment plan which includes an antibiotic and pain medication with pt at bedside and pt agreed to plan. Advised pt to follow up with a dentist.   Labs Review Labs Reviewed - No data to display Imaging Review No results found.  EKG Interpretation   None       MDM   Final diagnoses:  Pain, dental    11:39 AM Patient likely has dental  infection. Patient will have Veetid and Tramadol for symptoms. Patient will have a resource guide for dentist follow up. Vitals stable and patient afebrile.   I personally performed the services described in this documentation, which was scribed in my presence. The recorded information has been reviewed and is accurate.   Emilia BeckKaitlyn Lauri Till, PA-C 11/05/13 1141

## 2013-11-05 NOTE — ED Notes (Signed)
Left facial swelling and left upper dental pain x  2 days.

## 2013-11-05 NOTE — ED Notes (Signed)
Pt has dental abscess to left upper face

## 2013-11-05 NOTE — Discharge Instructions (Signed)
Take Veetid as directed until gone. Take Tramadol as needed for pain. Follow up with a dentist from the list below. Refer to attached documents for more information.

## 2013-11-14 NOTE — ED Provider Notes (Signed)
Medical screening examination/treatment/procedure(s) were performed by non-physician practitioner and as supervising physician I was immediately available for consultation/collaboration.      Gavin PoundMichael Y. Oletta LamasGhim, MD 11/14/13 [phone]

## 2015-09-16 ENCOUNTER — Encounter (HOSPITAL_COMMUNITY): Payer: Self-pay | Admitting: *Deleted

## 2015-09-16 ENCOUNTER — Emergency Department (HOSPITAL_COMMUNITY)
Admission: EM | Admit: 2015-09-16 | Discharge: 2015-09-16 | Disposition: A | Discharge status: Home / Self Care | Payer: Self-pay | Attending: Emergency Medicine | Admitting: Emergency Medicine

## 2015-09-16 DIAGNOSIS — Z792 Long term (current) use of antibiotics: Secondary | ICD-10-CM | POA: Insufficient documentation

## 2015-09-16 DIAGNOSIS — K002 Abnormalities of size and form of teeth: Secondary | ICD-10-CM | POA: Insufficient documentation

## 2015-09-16 DIAGNOSIS — K0889 Other specified disorders of teeth and supporting structures: Secondary | ICD-10-CM | POA: Insufficient documentation

## 2015-09-16 DIAGNOSIS — K029 Dental caries, unspecified: Secondary | ICD-10-CM | POA: Insufficient documentation

## 2015-09-16 MED ORDER — IBUPROFEN 800 MG PO TABS: 800.0000 mg | ORAL_TABLET | Freq: Three times a day (TID) | ORAL | Status: DC | PRN

## 2015-09-16 MED ORDER — ACETAMINOPHEN-CODEINE #3 300-30 MG PO TABS: 1.0000 | ORAL_TABLET | Freq: Four times a day (QID) | ORAL | Status: DC | PRN

## 2015-09-16 MED ORDER — PENICILLIN V POTASSIUM 500 MG PO TABS: 500.0000 mg | ORAL_TABLET | Freq: Four times a day (QID) | ORAL | Status: DC

## 2015-09-16 NOTE — Discharge Instructions (Signed)
Read the information below.  Use the prescribed medication as directed.  Please discuss all new medications with your pharmacist.  You may return to the Emergency Department at any time for worsening condition or any new symptoms that concern you.  Please call the dentist within 48 hours to schedule a close follow up appointment.  If you develop fevers, swelling in your face, difficulty swallowing or breathing, return to the ER immediately for a recheck.     Dental Caries Dental caries (also called tooth decay) is the most common oral disease. It can occur at any age but is more common in children and young adults.  HOW DENTAL CARIES DEVELOPS  The process of decay begins when bacteria and foods (particularly sugars and starches) combine in your mouth to produce plaque. Plaque is a substance that sticks to the hard, outer surface of a tooth (enamel). The bacteria in plaque produce acids that attack enamel. These acids may also attack the root surface of a tooth (cementum) if it is exposed. Repeated attacks dissolve these surfaces and create holes in the tooth (cavities). If left untreated, the acids destroy the other layers of the tooth.  RISK FACTORS  Frequent sipping of sugary beverages.   Frequent snacking on sugary and starchy foods, especially those that easily get stuck in the teeth.   Poor oral hygiene.   Dry mouth.   Substance abuse such as methamphetamine abuse.   Broken or poor-fitting dental restorations.   Eating disorders.   Gastroesophageal reflux disease (GERD).   Certain radiation treatments to the head and neck. SYMPTOMS In the early stages of dental caries, symptoms are seldom present. Sometimes white, chalky areas may be seen on the enamel or other tooth layers. In later stages, symptoms may include:  Pits and holes on the enamel.  Toothache after sweet, hot, or cold foods or drinks are consumed.  Pain around the tooth.  Swelling around the  tooth. DIAGNOSIS  Most of the time, dental caries is detected during a regular dental checkup. A diagnosis is made after a thorough medical and dental history is taken and the surfaces of your teeth are checked for signs of dental caries. Sometimes special instruments, such as lasers, are used to check for dental caries. Dental X-ray exams may be taken so that areas not visible to the eye (such as between the contact areas of the teeth) can be checked for cavities.  TREATMENT  If dental caries is in its early stages, it may be reversed with a fluoride treatment or an application of a remineralizing agent at the dental office. Thorough brushing and flossing at home is needed to aid these treatments. If it is in its later stages, treatment depends on the location and extent of tooth destruction:   If a small area of the tooth has been destroyed, the destroyed area will be removed and cavities will be filled with a material such as gold, silver amalgam, or composite resin.   If a large area of the tooth has been destroyed, the destroyed area will be removed and a cap (crown) will be fitted over the remaining tooth structure.   If the center part of the tooth (pulp) is affected, a procedure called a root canal will be needed before a filling or crown can be placed.   If most of the tooth has been destroyed, the tooth may need to be pulled (extracted). HOME CARE INSTRUCTIONS You can prevent, stop, or reverse dental caries at home by practicing  good oral hygiene. Good oral hygiene includes:  Thoroughly cleaning your teeth at least twice a day with a toothbrush and dental floss.   Using a fluoride toothpaste. A fluoride mouth rinse may also be used if recommended by your dentist or health care provider.   Restricting the amount of sugary and starchy foods and sugary liquids you consume.   Avoiding frequent snacking on these foods and sipping of these liquids.   Keeping regular visits with a  dentist for checkups and cleanings. PREVENTION   Practice good oral hygiene.  Consider a dental sealant. A dental sealant is a coating material that is applied by your dentist to the pits and grooves of teeth. The sealant prevents food from being trapped in them. It may protect the teeth for several years.  Ask about fluoride supplements if you live in a community without fluorinated water or with water that has a low fluoride content. Use fluoride supplements as directed by your dentist or health care provider.  Allow fluoride varnish applications to teeth if directed by your dentist or health care provider.   This information is not intended to replace advice given to you by your health care provider. Make sure you discuss any questions you have with your health care provider.   Document Released: 05/29/2002 Document Revised: 09/27/2014 Document Reviewed: 09/08/2012 Elsevier Interactive Patient Education 2016 Elsevier Inc.  Dental Pain Dental pain may be caused by many things, including:  Tooth decay (cavities or caries). Cavities expose the nerve of your tooth to air and hot or cold temperatures. This can cause pain or discomfort.  Abscess or infection. A dental abscess is a collection of infected pus from a bacterial infection in the inner part of the tooth (pulp). It usually occurs at the end of the tooth's root.  Injury.  An unknown reason (idiopathic). Your pain may be mild or severe. It may only occur when:  You are chewing.  You are exposed to hot or cold temperature.  You are eating or drinking sugary foods or beverages, such as soda or candy. Your pain may also be constant. HOME CARE INSTRUCTIONS Watch your dental pain for any changes. The following actions may help to lessen any discomfort that you are feeling:  Take medicines only as directed by your dentist.  If you were prescribed an antibiotic medicine, finish all of it even if you start to feel better.  Keep  all follow-up visits as directed by your dentist. This is important.  Do not apply heat to the outside of your face.  Rinse your mouth or gargle with salt water if directed by your dentist. This helps with pain and swelling.  You can make salt water by adding  tsp of salt to 1 cup of warm water.  Apply ice to the painful area of your face:  Put ice in a plastic bag.  Place a towel between your skin and the bag.  Leave the ice on for 20 minutes, 2-3 times per day.  Avoid foods or drinks that cause you pain, such as:  Very hot or very cold foods or drinks.  Sweet or sugary foods or drinks. SEEK MEDICAL CARE IF:  Your pain is not controlled with medicines.  Your symptoms are worse.  You have new symptoms. SEEK IMMEDIATE MEDICAL CARE IF:  You are unable to open your mouth.  You are having trouble breathing or swallowing.  You have a fever.  Your face, neck, or jaw is swollen.  This information is not intended to replace advice given to you by your health care provider. Make sure you discuss any questions you have with your health care provider.   Document Released: 09/06/2005 Document Revised: 01/21/2015 Document Reviewed: 09/02/2014 Elsevier Interactive Patient Education 2016 [employer]    Emergency Department Resource Guide 1) Find a Doctor and Pay Out of Pocket Although you won't have to find out who is covered by your insurance plan, it is a good idea to ask around and get recommendations. You will then need to call the office and see if the doctor you have chosen will accept you as a new patient and what types of options they offer for patients who are self-pay. Some doctors offer discounts or will set up payment plans for their patients who do not have insurance, but you will need to ask so you aren't surprised when you get to your appointment.  2) Contact Your Local Health Department Not all health departments have doctors that can see patients for sick  visits, but many do, so it is worth a call to see if yours does. If you don't know where your local health department is, you can check in your phone book. The CDC also has a tool to help you locate your state's health department, and many state websites also have listings of all of their local health departments.  3) Find a Walk-in Clinic If your illness is not likely to be very severe or complicated, you may want to try a walk in clinic. These are popping up all over the country in pharmacies, drugstores, and shopping centers. They're usually staffed by nurse practitioners or physician assistants that have been trained to treat common illnesses and complaints. They're usually fairly quick and inexpensive. However, if you have serious medical issues or chronic medical problems, these are probably not your best option.  No Primary Care Doctor: - Call Health Connect at  [phone] - they can help you locate a primary care doctor that  accepts your insurance, provides certain services, etc. - Physician Referral Service- [phone]  Chronic Pain Problems: Organization         Address  Phone   Notes  [facility]  [phone] Patients need to be referred by their primary care doctor.   Medication Assistance: Organization         Address  Phone   Notes  [facility] [address] [place], [place] [address] [phone] --Must be a resident of [place] -- Must have NO insurance coverage whatsoever (no Medicaid/ Medicare, etc.) -- The pt. MUST have a primary care doctor that directs their care regularly and follows them in the community   [facility]ist  [phone]   [employer]  [phone]    Agencies that provide inexpensive medical care: Organization         Address  Phone   Notes  [facility]  [phone]   [facility]    [phone]   [facility] [address] [place], [place] [address] [phone]   [facility] [address], [place] [phone]   [facility]    [phone]) [phone]   [facility]    [phone]   [facility]  [address], [place] Phone:  [phone], Fax:  [phone] Hours of Operation:  9 am -  6 pm, M-F.  Also accepts Medicaid/Medicare and self-pay.  [facility]  [address], [place] Phone: [phone], Fax: [phone]. Hours of Operation:  8:30 am - 5:30 pm, M-F.  Also accepts Medicaid and self-pay.  [facility] [place] [address] [place] Phone: [phone]   [facility] [address] Natasha Bence [place], [place] [phone], Ext. [phone] Mondays & Thursdays: 7-9 AM.  First 15 patients are seen on a first come, first serve basis.    Medicaid-accepting [facility] Providers:  Organization         Address  Phone   Notes  [facility] [address], [place] [phone] Also accepts self-pay patients.  [facility] [address] Laurell Josephs [place], [place]  [phone]   [facility] [address], [place] [phone]   [facility] [address] [place] [phone]   Renaye Rakers [address], [place]   [phone] Only accepts [place] patients after they have their name applied to their card.   Self-Pay (no insurance) in [facility]:  Organization         Address  Phone   Notes  Sickle Cell Patients, [facility] [address] [place], [place] [phone]   [facility] [address] [place], [place] [phone]   [facility]  [address],  [phone]   [facility]/Dr. Osei-Bonsu  [address]  [place] or [address], [place] [phone] Phone number for both [place] and [place] locations is the same.  [facility] [address] [place] [phone]   [facility] [address] [place] or [address] [phone]   [facility] [address] [place] [phone], phone; [phone], fax Sees patients 1st and 3rd Saturday of every month.  Must not qualify for public or private insurance (i.e. Medicaid, Medicare, [place] Health Choice, Veterans' Benefits)  Household income should be no more than 200% of the poverty level The clinic cannot treat you if you are pregnant or think you are pregnant  Sexually transmitted diseases are not treated at the clinic.    Dental Care: Organization         Address  Phone  Notes  [facility] of [facility] [address] [place], [place] [phone] Accepts children up to age 74 who are enrolled in [place] or [place] Health Choice; pregnant women with a Medicaid card; and children who have applied for Medicaid or [place] Health Choice, but were declined, whose parents can pay a reduced fee at time of service.  [facility]  [address], [place] [phone] Accepts children up to age 71 who are enrolled in [place] or [place] Health Choice; pregnant women with a Medicaid card; and children who have applied for Medicaid or [place] Health Choice, but were declined, whose parents can pay a reduced fee at time of service.  [facility]  [address] [place], [place] [phone] Patients are seen by appointment only. Walk-ins are not accepted. [facility] will see patients 84 years of age and older.  Monday - Tuesday (8am-5pm) Most Wednesdays (8:30-5pm) $30 per visit, cash only  [facility]  [address]  Dr, [facility] [phone] Patients are seen by appointment only. Walk-ins are not accepted. [facility] will see patients 62 years of age and older. One Wednesday Evening (Monthly: Volunteer Based).  $30 per visit, cash only  [employer]  [phone] for adults; Children under age 81, call Graduate Pediatric Dentistry at [phone]. Children aged 46-14, please call [phone] to request a pediatric application.  Dental services are provided in all areas of dental care including fillings, crowns and bridges, complete and partial dentures, implants, gum treatment, root canals, and extractions. Preventive care is also provided. Treatment is provided to both adults and children. Patients are selected via a lottery and there is often a waiting list.   [facility] [address], [place]  [phone] [email or web address]   [facility] [address] [place], [place] [phone], Ext. [phone] Second and Fourth Thursday of each month, opens at 6:30 AM; Clinic ends at 9 AM.  Patients are seen on a first-come first-served basis, and a limited number are seen during each clinic.   [facility]  [address] Ether Griffins [place], [place] [phone]   Eligibility Requirements You must have lived in [place], [place], or [place] counties for at least the last three months.   You cannot be eligible for state or federal sponsored [place], including [employer], [place], or [employer].   You generally cannot be eligible for healthcare insurance through your employer.    How to apply: Eligibility screenings are held every Tuesday and Wednesday afternoon from 1:00 pm until 4:00 pm. You do not need an appointment for the interview!  [facility] [address], [place], [place] [phone]   [facility] Department  [phone]   [facility] Health Department  [phone]   [facility] Department  [phone]    Behavioral Health Resources in the Community: Intensive Outpatient Programs Organization         Address  Phone  Notes  [facility] [address], [place], [place] [phone]   [facility] [address], [place], [place] [phone]   [facility] [address], [place], [place]  [phone]   [facility] [address],  [place], [place] [phone] or [phone]   Substance Abuse Resources Organization         Address  Phone  Notes  [facility]  [phone]   [facility]  [phone]   [facility]  [phone]   Floydene Flock  [phone]   Residential & Outpatient Substance Abuse Program  [phone]   Psychological Services Organization         Address  Phone  Notes  [facility]  [phone]   [facility]  [phone]   [facility] [address], [place] [phone] or [phone]    Mobile Crisis Teams Organization         Address  Phone  Notes  [facility], Mobile Crisis Care Unit  [phone]   [facility]  [address] [place], [place] [phone]   Doristine Locks [address] [place] [phone]    Self-Help/Support Groups Organization         Address  Phone  Notes  [facility] [place] - variety of support groups  [phone]- I7437963 Call for more information  Narcotics Anonymous (NA), [facility] [address], [employer] [place]  2 meetings at this location   Statistician         Address  Phone  Notes  [facility] [address],    [place]  [phone]   [facility]  [address] [place] [address], [place], [place] [phone]   [facility] [address] [place], [place]  [phone] Admissions: 8am-3pm M-F  [facility] [address],    [place], [place] [phone]   [facility] [address] [place], [place], [place] [phone]   [facility] [address],  [place], [place] [phone]   [facility] - Intensive Outpatient [address] Laurell Josephs [address], [place], [place] [phone]   [facility] ([facility] [address]  [place], [place] [phone] or [phone]   [facility] ([facility]) [address], [place], [place] [phone] Accepts Medicaid  [facility] [address],  [place] [phone] Substance Abuse/Addiction Treatment   [facility] Organization         Address  Phone  Notes  [facility]  [phone]   Angie Fava, PhD [address] Ervin Knack [place], [place]   [phone] or [phone]   [facility]   [address] [place], [place] [phone]   [facility] [address], [place], [place] [phone] Insurance/Medicaid/sponsorship through [facility] and Families [address]                                    [place], [place] [phone] Therapy/tele-psych/case  [facility] [address], [place] [phone]    Dr. Lolly Mustache  [phone]   [facility] [place]  United Way [facility] Dept. 1) [address], [place] 2) [address], [place] 3)  [address], [place] [phone]  [phone]   [facility] Child Abuse Hotline 506-366-0275 or [phone] (After Hours)

## 2015-09-16 NOTE — ED Notes (Signed)
The pt is c/o pain and swelling on the lt side of her face.  She has had a toothache for the past 2-3 days  lmp last month

## 2015-09-16 NOTE — ED Provider Notes (Signed)
CSN: [id number]     Arrival date & time 09/16/15  2129 History  By signing my name below, I, Jarvis Morgan, attest that this documentation has been prepared under the direction and in the presence of Trixie Dredge, PA-C  Electronically Signed: Jarvis Morgan, ED Scribe. 09/16/2015. 10:38 PM.    Chief Complaint  Patient presents with  . Dental Problem    The history is provided by the patient. No language interpreter was used.    HPI Comments: Kristen Santos is a 29 y.o. female with no PMHx who presents to the Emergency Department complaining of gradual onset, constant, 10/10, moderate, left upper dental pain onset 2 days. She reports associated swelling to the left side of her face and drainage from the gum. She states she has a h/o dental abscesses in the past and notes this feels similar. Pt notes she no longer has a tooth to that area due to poor dental hygiene. She has been taking Advil prior to arrival with no significant relief. Pt endorses the pain is exacerbated with applied pressure to the area. She states she does not have access to dental care due to inability to pay for it. Pt's LNMP was 1 month ago. She denies any fever, chills, sore throat, difficulty swallowing, difficulty breathing, trismus or other associated symptoms.  Past Medical History  Diagnosis Date  . Ileus [facility])    Past Surgical History  Procedure Laterality Date  . Abdominal surgery     No family history on file. Social History  Substance Use Topics  . Smoking status: Never Smoker   . Smokeless tobacco: None  . Alcohol Use: No   OB History    No data available     Review of Systems  Constitutional: Negative for fever and chills.  HENT: Positive for dental problem and facial swelling. Negative for sore throat and trouble swallowing.   Respiratory: Negative for shortness of breath.   Musculoskeletal: Negative for myalgias, neck pain and neck stiffness.  Allergic/Immunologic: Negative for  immunocompromised state.  Hematological: Does not bruise/bleed easily.  Psychiatric/Behavioral: Negative for self-injury.      Allergies  Review of patient's allergies indicates no known allergies.  Home Medications   Prior to Admission medications   Medication Sig Start Date End Date Taking? Authorizing Provider  ibuprofen (ADVIL,MOTRIN) 200 MG tablet Take 600 mg by mouth every 8 (eight) hours as needed for moderate pain.     Historical Provider, MD  penicillin v potassium (VEETID) 500 MG tablet Take 1 tablet (500 mg total) by mouth 4 (four) times daily. 11/05/13   Kaitlyn Szekalski, PA-C  traMADol (ULTRAM) 50 MG tablet Take 1 tablet (50 mg total) by mouth every 6 (six) hours as needed. 11/05/13   Emilia Beck, PA-C   Triage Vitals: BP 117/70 mmHg  Pulse 100  Temp(Src) 98.2 F (36.8 C) (Oral)  Resp 18  Ht  (1.676 m)  Wt 198 lb (89.812 kg)  BMI 31.97 kg/m2  SpO2 100%  LMP 08/17/2015  Physical Exam  Constitutional: She appears well-developed and well-nourished. No distress.  HENT:  Head: Normocephalic and atraumatic.  Mouth/Throat: Uvula is midline and oropharynx is clear and moist. Mucous membranes are not dry. No trismus in the jaw. Abnormal dentition. Dental caries present. No uvula swelling. No oropharyngeal exudate, posterior oropharyngeal edema, posterior oropharyngeal erythema or tonsillar abscesses.  Widespread dental decay  Neck: Trachea normal, normal range of motion and phonation normal. Neck supple. No tracheal tenderness present. No rigidity. No tracheal  deviation, no edema, no erythema and normal range of motion present.  Cardiovascular: Normal rate.   Pulmonary/Chest: Effort normal and breath sounds normal. No stridor.  Lymphadenopathy:    She has no cervical adenopathy.  Neurological: She is alert.  Skin: She is not diaphoretic.  Nursing note and vitals reviewed.   ED Course  Procedures (including critical care time)  DIAGNOSTIC STUDIES: Oxygen  Saturation is 100% on RA, normal by my interpretation.    COORDINATION OF CARE: 10:40 PM- Will d/c pt home with abx, pain medication and dental referral.  Pt advised of plan for treatment and pt agrees.      Labs Review Labs Reviewed - No data to display  Imaging Review No results found.    EKG Interpretation None      MDM   Final diagnoses:  Dental decay  Pain, dental    Afebrile, nontoxic patient with new dental pain with severe widespread dental decay.  No obvious abscess.  No concerning findings on exam.  Doubt deep space head or neck infection.  Doubt Ludwig's angina.  D/C home with antibiotic, pain medication and dental follow up.  Resources given.  There is no dentist or oral surgeon on call. Discussed findings, treatment, and follow up  with patient.  Pt given return precautions.  Pt verbalizes understanding and agrees with plan.        I personally performed the services described in this documentation, which was scribed in my presence. The recorded information has been reviewed and is accurate.     Trixie Dredgemily Morayo Leven, PA-C 09/16/15 2304  Loren Raceravid Yelverton, MD 09/20/15 [phone]

## 2015-09-22 ENCOUNTER — Encounter (HOSPITAL_COMMUNITY): Payer: Self-pay

## 2015-09-22 ENCOUNTER — Inpatient Hospital Stay (HOSPITAL_COMMUNITY): Payer: Self-pay

## 2015-09-22 ENCOUNTER — Inpatient Hospital Stay (HOSPITAL_COMMUNITY)
Admission: AD | Admit: 2015-09-22 | Discharge: 2015-09-22 | Disposition: A | Discharge status: Home / Self Care | Payer: Self-pay | Source: Ambulatory Visit | Attending: Family Medicine | Admitting: Family Medicine

## 2015-09-22 DIAGNOSIS — O209 Hemorrhage in early pregnancy, unspecified: Secondary | ICD-10-CM | POA: Insufficient documentation

## 2015-09-22 DIAGNOSIS — O3680X0 Pregnancy with inconclusive fetal viability, not applicable or unspecified: Secondary | ICD-10-CM

## 2015-09-22 DIAGNOSIS — O4691 Antepartum hemorrhage, unspecified, first trimester: Secondary | ICD-10-CM

## 2015-09-22 LAB — URINALYSIS, ROUTINE W REFLEX MICROSCOPIC
BILIRUBIN URINE: NEGATIVE
GLUCOSE, UA: NEGATIVE mg/dL
KETONES UR: NEGATIVE mg/dL
Leukocytes, UA: NEGATIVE
Nitrite: NEGATIVE
PH: 6 (ref 5.0–8.0)
Protein, ur: 30 mg/dL — AB
Specific Gravity, Urine: 1.025 (ref 1.005–1.030)

## 2015-09-22 LAB — CBC
HCT: 37.3 % (ref 36.0–46.0)
Hemoglobin: 12.7 g/dL (ref 12.0–15.0)
MCH: 31.9 pg (ref 26.0–34.0)
MCHC: 34 g/dL (ref 30.0–36.0)
MCV: 93.7 fL (ref 78.0–100.0)
Platelets: 269 10*3/uL (ref 150–400)
RBC: 3.98 MIL/uL (ref 3.87–5.11)
RDW: 13.4 % (ref 11.5–15.5)
WBC: 4.3 10*3/uL (ref 4.0–10.5)

## 2015-09-22 LAB — HCG, QUANTITATIVE, PREGNANCY: hCG, Beta Chain, Quant, S: 126 m[IU]/mL — ABNORMAL HIGH (ref <5)

## 2015-09-22 LAB — POCT PREGNANCY, URINE: Preg Test, Ur: POSITIVE — AB

## 2015-09-22 LAB — URINE MICROSCOPIC-ADD ON

## 2015-09-22 LAB — WET PREP, GENITAL
CLUE CELLS WET PREP: NONE SEEN
Sperm: NONE SEEN
Trich, Wet Prep: NONE SEEN
Yeast Wet Prep HPF POC: NONE SEEN

## 2015-09-22 NOTE — Discharge Instructions (Signed)

## 2015-09-22 NOTE — MAU Note (Addendum)
Patient had two positive UPTs at home last night, recent intercourse, started having vaginal bleeding this morning. [facility] was [facility] for tooth pain and given antibiotics and pain meds.  Patient is concerned about the fact that she has found out she is pregnant and taking these medications.

## 2015-09-22 NOTE — MAU Provider Note (Signed)
History     CSN: [id number]  Arrival date and time: 09/22/15 [id number]   First Provider Initiated Contact with Patient 09/22/15 [phone]      Chief Complaint  Patient presents with  . Vaginal Bleeding   Vaginal Bleeding The patient's primary symptoms include vaginal bleeding. This is a new problem. The current episode started today. The problem occurs intermittently. The problem has been unchanged. The patient is experiencing no pain. She is pregnant. Pertinent negatives include no abdominal pain, chills, constipation, diarrhea, dysuria, fever, frequency, nausea, urgency or vomiting. The vaginal bleeding is typical of menses. She has not been passing clots. She has not been passing tissue. Nothing aggravates the symptoms. She has tried nothing for the symptoms. She is sexually active. It is unknown whether or not her partner has an STD. She uses nothing for contraception. Her menstrual history has been regular (LMP: 08/14/15).    Past Medical History  Diagnosis Date  . Ileus [facility])     Past Surgical History  Procedure Laterality Date  . Abdominal surgery      History reviewed. No pertinent family history.  Social History  Substance Use Topics  . Smoking status: Never Smoker   . Smokeless tobacco: None  . Alcohol Use: No    Allergies: No Known Allergies  Prescriptions prior to admission  Medication Sig Dispense Refill Last Dose  . acetaminophen-codeine (TYLENOL #3) 300-30 MG tablet Take 1-2 tablets by mouth every 6 (six) hours as needed for moderate pain. 15 tablet 0   . ibuprofen (ADVIL,MOTRIN) 800 MG tablet Take 1 tablet (800 mg total) by mouth every 8 (eight) hours as needed for mild pain or moderate pain. 20 tablet 0   . penicillin v potassium (VEETID) 500 MG tablet Take 1 tablet (500 mg total) by mouth 4 (four) times daily. 40 tablet 0   . traMADol (ULTRAM) 50 MG tablet Take 1 tablet (50 mg total) by mouth every 6 (six) hours as needed. 15 tablet 0    Results for orders placed  or performed during the hospital encounter of 09/22/15 (from the past 48 hour(s))  Urinalysis, Routine w reflex microscopic (not at [facility])     Status: Abnormal   Collection Time: 09/22/15  7:25 AM  Result Value Ref Range   Color, Urine YELLOW YELLOW   APPearance CLEAR CLEAR   Specific Gravity, Urine 1.025 1.005 - 1.030   pH 6.0 5.0 - 8.0   Glucose, UA NEGATIVE NEGATIVE mg/dL   Hgb urine dipstick LARGE (A) NEGATIVE   Bilirubin Urine NEGATIVE NEGATIVE   Ketones, ur NEGATIVE NEGATIVE mg/dL   Protein, ur 30 (A) NEGATIVE mg/dL   Nitrite NEGATIVE NEGATIVE   Leukocytes, UA NEGATIVE NEGATIVE  Urine microscopic-add on     Status: Abnormal   Collection Time: 09/22/15  7:25 AM  Result Value Ref Range   Squamous Epithelial / LPF 0-5 (A) NONE SEEN   WBC, UA 0-5 0 - 5 WBC/hpf   RBC / HPF 6-30 0 - 5 RBC/hpf   Bacteria, UA MANY (A) NONE SEEN   Urine-Other MUCOUS PRESENT   Pregnancy, urine POC     Status: Abnormal   Collection Time: 09/22/15  7:32 AM  Result Value Ref Range   Preg Test, Ur POSITIVE (A) NEGATIVE    Comment:        THE SENSITIVITY OF THIS METHODOLOGY IS >24 mIU/mL   hCG, quantitative, pregnancy     Status: Abnormal   Collection Time: 09/22/15  8:05 AM  Result  Value Ref Range   hCG, Beta Chain, Quant, S 126 (H) <5 mIU/mL    Comment:          GEST. AGE      CONC.  (mIU/mL)   <=1 WEEK        5 - 50     2 WEEKS       50 - 500     3 WEEKS       100 - 10,000     4 WEEKS     1,000 - 30,000     5 WEEKS     3,500 - 115,000   6-8 WEEKS     12,000 - 270,000    12 WEEKS     15,000 - 220,000        FEMALE AND NON-PREGNANT FEMALE:     LESS THAN 5 mIU/mL   Wet prep, genital     Status: Abnormal   Collection Time: 09/22/15  8:07 AM  Result Value Ref Range   Yeast Wet Prep HPF POC NONE SEEN NONE SEEN   Trich, Wet Prep NONE SEEN NONE SEEN   Clue Cells Wet Prep HPF POC NONE SEEN NONE SEEN   WBC, Wet Prep HPF POC MANY (A) NONE SEEN    Comment: MANY BACTERIA SEEN   Sperm NONE SEEN    CBC     Status: None   Collection Time: 09/22/15  8:08 AM  Result Value Ref Range   WBC 4.3 4.0 - 10.5 K/uL   RBC 3.98 3.87 - 5.11 MIL/uL   Hemoglobin 12.7 12.0 - 15.0 g/dL   HCT 88.4 16.6 - 06.3 %   MCV 93.7 78.0 - 100.0 fL   MCH 31.9 26.0 - 34.0 pg   MCHC 34.0 30.0 - 36.0 g/dL   RDW 01.6 01.0 - 93.2 %   Platelets 269 150 - 400 K/uL     Review of Systems  Constitutional: Negative for fever and chills.  Gastrointestinal: Negative for nausea, vomiting, abdominal pain, diarrhea and constipation.  Genitourinary: Positive for vaginal bleeding. Negative for dysuria, urgency and frequency.   Physical Exam   Blood pressure 104/77, pulse 70, temperature 98 F (36.7 C), temperature source Oral, last menstrual period 08/14/2015.  Physical Exam  Nursing note and vitals reviewed. Constitutional: She is oriented to person, place, and time. She appears well-developed and well-nourished. No distress.  HENT:  Head: Normocephalic.  Cardiovascular: Normal rate.   Respiratory: Effort normal.  GI: Soft. There is no tenderness.  Genitourinary:  External: no lesion Vagina: small amount of blood seen  Cervix: pink, smooth, no CMT Uterus: NSSC Adnexa: NT   Neurological: She is alert and oriented to person, place, and time.  Skin: Skin is warm and dry.  Psychiatric: She has a normal mood and affect.    MAU Course  Procedures  MDM 0800 Care turned over to Blanche East, NP [place] and labwork pending  O positive blood type   Tawnya Crook 8:00 AM 09/22/2015   Assessment and Plan   A:  1. Pregnancy of unknown anatomic location   2. Vaginal bleeding in pregnancy, first trimester     P:  Discharge home in stable condition Return to the [facility] in 48 hours for quant Return to MAU with worsening pain/bleeding Pelvic rest Ectopic precautions  Bleeding precautions  Duane Lope, NP 09/22/2015 2:47 PM

## 2015-09-23 LAB — HIV ANTIBODY (ROUTINE TESTING W REFLEX): HIV Screen 4th Generation wRfx: NONREACTIVE

## 2015-09-23 LAB — GC/CHLAMYDIA PROBE AMP (~~LOC~~) NOT AT ARMC
CHLAMYDIA, DNA PROBE: NEGATIVE
NEISSERIA GONORRHEA: NEGATIVE

## 2015-09-23 LAB — RPR: RPR: NONREACTIVE

## 2015-09-24 ENCOUNTER — Encounter: Payer: Self-pay | Admitting: Advanced Practice Midwife

## 2015-09-24 ENCOUNTER — Other Ambulatory Visit (INDEPENDENT_AMBULATORY_CARE_PROVIDER_SITE_OTHER): Payer: Self-pay | Admitting: Advanced Practice Midwife

## 2015-09-24 VITALS — BP 121/66 | HR 58 | Temp 98.2°F

## 2015-09-24 DIAGNOSIS — Z30011 Encounter for initial prescription of contraceptive pills: Secondary | ICD-10-CM

## 2015-09-24 DIAGNOSIS — Z3490 Encounter for supervision of normal pregnancy, unspecified, unspecified trimester: Secondary | ICD-10-CM

## 2015-09-24 DIAGNOSIS — O4691 Antepartum hemorrhage, unspecified, first trimester: Secondary | ICD-10-CM

## 2015-09-24 LAB — HCG, QUANTITATIVE, PREGNANCY: HCG, BETA CHAIN, QUANT, S: 17 m[IU]/mL — AB (ref <5)

## 2015-09-24 MED ORDER — NORGESTIMATE-ETH ESTRADIOL 0.25-35 MG-MCG PO TABS: 1.0000 | ORAL_TABLET | Freq: Every day | ORAL | Status: DC

## 2015-09-24 NOTE — Progress Notes (Signed)
History   Chief Complaint:  F/U HCG  Kristen Santos is  30 y.o. G3P1011 Patient's last menstrual period was 08/14/2015.Marland Kitchen Patient is here for follow up of quantitative HCG and ongoing surveillance of pregnancy status. Was seen in MAU 09/21/14 for vaginal bleeding. She is [redacted]w[redacted]d weeks gestation  by LMP.    Since her last visit, the patient is without new complaint.     ROS Abdomin Pain: none Vaginal bleeding: light.   Passage of clots or tissue: None Dizziness: None  Her previous Quantitative HCG values are:  Results for Kristen Santos, Kristen Santos (MRN [id number]) as of 09/24/2015 14:10  Ref. Range 09/22/2015 08:05  HCG, Beta Chain, Quant, S Latest Ref Range: <5 mIU/mL 126 (H)    Physical Exam  BP 121/66 mmHg  Pulse 58  Temp(Src) 98.2 F (36.8 C) (Oral)  LMP 08/14/2015  LMP 08/14/2015 Constitutional: Well-nourished female in no apparent distress. No pallor Neuro: Alert and oriented 4 Cardiovascular: Normal rate Respiratory: Normal effort and rate Abdomen: Soft, nontender Gynecological Exam: examination not indicated  Labs: Results for orders placed or performed in visit on 09/24/15 (from the past 24 hour(s))  hCG, quantitative, pregnancy   Collection Time: 09/24/15  9:10 AM  Result Value Ref Range   hCG, Beta Chain, Quant, S 17 (H) <5 mIU/mL    Ultrasound Studies:   US Ob Comp Less 14 Wks  09/22/2015  CLINICAL DATA:  Positive pregnancy test with vaginal bleeding. EXAM: OBSTETRIC <14 WK [place] AND TRANSVAGINAL OB US TECHNIQUE: Both transabdominal and transvaginal ultrasound examinations were performed for complete evaluation of the gestation as well as the maternal uterus, adnexal regions, and pelvic cul-de-sac. Transvaginal technique was performed to assess early pregnancy. COMPARISON:  None. FINDINGS: Intrauterine gestational sac: Tiny cystic focus identified in the fundal endometrial canal cannot be definitely confirmed to represent gestational sac. Subchorionic hemorrhage:  None  visualized. Maternal uterus/adnexae: 3.8 cm complex cyst in the parenchyma of the left ovary shows no hypervascularity on Doppler evaluation. IMPRESSION: Tiny cystic focus in the fundal endometrial canal may be an early gestational sac but is not a definite finding. Complex cystic focus in the left ovary shows no hypervascularity and may be a corpus luteum cyst or hemorrhagic follicle. No definite ectopic pregnancy visualized at this time. Given the history of a positive pregnancy test, differential considerations include intrauterine gestation too early to visualize, completed abortion, or nonvisualized ectopic pregnancy. Close clinical correlation is recommended with serial beta-hCG and followup ultrasound as warranted. Electronically Signed   By: Kennith Center M.D.   On: 09/22/2015 09:06   US Ob Transvaginal  09/22/2015  CLINICAL DATA:  Positive pregnancy test with vaginal bleeding. EXAM: OBSTETRIC <14 WK [place] AND TRANSVAGINAL OB US TECHNIQUE: Both transabdominal and transvaginal ultrasound examinations were performed for complete evaluation of the gestation as well as the maternal uterus, adnexal regions, and pelvic cul-de-sac. Transvaginal technique was performed to assess early pregnancy. COMPARISON:  None. FINDINGS: Intrauterine gestational sac: Tiny cystic focus identified in the fundal endometrial canal cannot be definitely confirmed to represent gestational sac. Subchorionic hemorrhage:  None visualized. Maternal uterus/adnexae: 3.8 cm complex cyst in the parenchyma of the left ovary shows no hypervascularity on Doppler evaluation. IMPRESSION: Tiny cystic focus in the fundal endometrial canal may be an early gestational sac but is not a definite finding. Complex cystic focus in the left ovary shows no hypervascularity and may be a corpus luteum cyst or hemorrhagic follicle. No definite ectopic pregnancy visualized at this time. Given the history of  a positive pregnancy test, differential considerations  include intrauterine gestation too early to visualize, completed abortion, or nonvisualized ectopic pregnancy. Close clinical correlation is recommended with serial beta-hCG and followup ultrasound as warranted. Electronically Signed   By: Kennith CenterEric  Mansell M.D.   On: 09/22/2015 09:06    MAU course/MDM: Quantitative hCG ordered  Pain and bleeding in early pregnancy with significant drop in Quant, but hemodynamically stable. C/W SAB.   Assessment: 5538w6d weeks gestation w/ drop in Quant C/W SAB.   Plan: Support given. No further quants per Dr. Marice Potterove.  SAB precautions OCPs x 3 cycles, then plans to TTC.   Cont PNVs  [place] Diany [place], [place] 09/24/2015, 2:12 PM  2/3

## 2015-09-24 NOTE — Patient Instructions (Signed)
Miscarriage  A miscarriage is the sudden loss of an unborn baby (fetus) before the 20th week of pregnancy. Most miscarriages happen in the first 3 months of pregnancy. Sometimes, it happens before a woman even knows she is pregnant. A miscarriage is also called a "spontaneous miscarriage" or "early pregnancy loss." Having a miscarriage can be an emotional experience. Talk with your caregiver about any questions you may have about miscarrying, the grieving process, and your future pregnancy plans.  CAUSES    Problems with the fetal chromosomes that make it impossible for the baby to develop normally. Problems with the baby's genes or chromosomes are most often the result of errors that occur, by chance, as the embryo divides and grows. The problems are not inherited from the parents.   Infection of the cervix or uterus.    Hormone problems.    Problems with the cervix, such as having an incompetent cervix. This is when the tissue in the cervix is not strong enough to hold the pregnancy.    Problems with the uterus, such as an abnormally shaped uterus, uterine fibroids, or congenital abnormalities.    Certain medical conditions.    Smoking, drinking alcohol, or taking illegal drugs.    Trauma.   Often, the cause of a miscarriage is unknown.   SYMPTOMS    Vaginal bleeding or spotting, with or without cramps or pain.   Pain or cramping in the abdomen or lower back.   Passing fluid, tissue, or blood clots from the vagina.  DIAGNOSIS   Your caregiver will perform a physical exam. You may also have an ultrasound to confirm the miscarriage. Blood or urine tests may also be ordered.  TREATMENT    Sometimes, treatment is not necessary if you naturally pass all the fetal tissue that was in the uterus. If some of the fetus or placenta remains in the body (incomplete miscarriage), tissue left behind may become infected and must be removed. Usually, a dilation and curettage (D and C) procedure is performed.  During a D and C procedure, the cervix is widened (dilated) and any remaining fetal or placental tissue is gently removed from the uterus.   Antibiotic medicines are prescribed if there is an infection. Other medicines may be given to reduce the size of the uterus (contract) if there is a lot of bleeding.   If you have Rh negative blood and your baby was Rh positive, you will need a Rh immunoglobulin shot. This shot will protect any future baby from having Rh blood problems in future pregnancies.  HOME CARE INSTRUCTIONS    Your caregiver may order bed rest or may allow you to continue light activity. Resume activity as directed by your caregiver.   Have someone help with home and family responsibilities during this time.    Keep track of the number of sanitary pads you use each day and how soaked (saturated) they are. Write down this information.    Do not use tampons. Do not douche or have sexual intercourse until approved by your caregiver.    Only take over-the-counter or prescription medicines for pain or discomfort as directed by your caregiver.    Do not take aspirin. Aspirin can cause bleeding.    Keep all follow-up appointments with your caregiver.    If you or your partner have problems with grieving, talk to your caregiver or seek counseling to help cope with the pregnancy loss. Allow enough time to grieve before trying to get pregnant again.     SEEK IMMEDIATE MEDICAL CARE IF:    You have severe cramps or pain in your back or abdomen.   You have a fever.   You pass large blood clots (walnut-sized or larger) ortissue from your vagina. Save any tissue for your caregiver to inspect.    Your bleeding increases.    You have a thick, bad-smelling vaginal discharge.   You become lightheaded, weak, or you faint.    You have chills.   MAKE SURE YOU:   Understand these instructions.   Will watch your condition.   Will get help right away if you are not doing well or get worse.     This  information is not intended to replace advice given to you by your health care provider. Make sure you discuss any questions you have with your health care provider.     Document Released: 03/02/2001 Document Revised: 01/01/2013 Document Reviewed: 10/26/2011  Elsevier Interactive Patient Education 2016 [employer]

## 2016-04-12 ENCOUNTER — Ambulatory Visit (INDEPENDENT_AMBULATORY_CARE_PROVIDER_SITE_OTHER): Payer: Self-pay

## 2016-04-12 ENCOUNTER — Encounter: Payer: Self-pay | Admitting: Family Medicine

## 2016-04-12 DIAGNOSIS — Z3200 Encounter for pregnancy test, result unknown: Secondary | ICD-10-CM

## 2016-04-12 DIAGNOSIS — Z3201 Encounter for pregnancy test, result positive: Secondary | ICD-10-CM

## 2016-04-12 LAB — POCT PREGNANCY, URINE: Preg Test, Ur: POSITIVE — AB

## 2016-04-12 NOTE — Progress Notes (Signed)
Patient presented for pregnancy test today. Test confirmed patient is pregnant around 6 weeks. Patient has been advised to start taking prenatal vitamins, She plans to start care at our office.

## 2016-04-30 ENCOUNTER — Encounter (HOSPITAL_COMMUNITY): Payer: Self-pay | Admitting: Emergency Medicine

## 2016-04-30 ENCOUNTER — Emergency Department (HOSPITAL_COMMUNITY)
Admission: EM | Admit: 2016-04-30 | Discharge: 2016-04-30 | Disposition: A | Discharge status: Home / Self Care | Payer: Medicaid Other | Attending: Emergency Medicine | Admitting: Emergency Medicine

## 2016-04-30 DIAGNOSIS — L03031 Cellulitis of right toe: Secondary | ICD-10-CM

## 2016-04-30 DIAGNOSIS — Z3A08 8 weeks gestation of pregnancy: Secondary | ICD-10-CM | POA: Diagnosis not present

## 2016-04-30 DIAGNOSIS — O99711 Diseases of the skin and subcutaneous tissue complicating pregnancy, first trimester: Secondary | ICD-10-CM | POA: Diagnosis present

## 2016-04-30 MED ORDER — CEPHALEXIN 500 MG PO CAPS: 500.0000 mg | ORAL_CAPSULE | Freq: Four times a day (QID) | ORAL | 0 refills | Status: DC

## 2016-04-30 NOTE — ED Provider Notes (Signed)
[facility]-EMERGENCY DEPT Provider Note   CSN: [id number] Arrival date & time: 04/30/16  [id number]  First Provider Contact:  First MD Initiated Contact with Patient 04/30/16 0946     By signing my name below, I, Freida Busmaniana Omoyeni, attest that this documentation has been prepared under the direction and in the presence of non-physician practitioner, Cheri FowlerKayla Cambri Plourde, PA-C. Electronically Signed: Freida Busmaniana Omoyeni, Scribe. 04/30/2016. 10:40 AM.   History   Chief Complaint Chief Complaint  Patient presents with  . Foot Pain    rt. little toe red and swollen     The history is provided by the patient. No language interpreter was used.    HPI Comments:  Kristen Santos is a 30 y.o. female who presents to the Emergency Department complaining of atraumatic right foot pain which she woke up with yesterday. She reports moderate constant pain at the site with associated swelling. She has iced and elevated the extremity without relief. She has not taken any medications as she is currently ~ [redacted] weeks pregnant. No fever or chills.   Past Medical History:  Diagnosis Date  . Ileus (HCC)     There are no active problems to display for this patient.   Past Surgical History:  Procedure Laterality Date  . ABDOMINAL SURGERY      OB History    Gravida Para Term Preterm AB Living   3 1 1   1 1    SAB TAB Ectopic Multiple Live Births   1               Home Medications    Prior to Admission medications   Medication Sig Start Date End Date Taking? Authorizing Provider  cephALEXin (KEFLEX) 500 MG capsule Take 1 capsule (500 mg total) by mouth 4 (four) times daily. 04/30/16   Cheri FowlerKayla Grayland Daisey, PA-C  norgestimate-ethinyl estradiol (ORTHO-CYCLEN,SPRINTEC,PREVIFEM) 0.25-35 MG-MCG tablet Take 1 tablet by mouth daily. Patient not taking: Reported on 04/12/2016 09/24/15   Dorathy KinsmanVirginia Smith, CNM  penicillin v potassium (VEETID) 500 MG tablet Take 1 tablet (500 mg total) by mouth 4 (four) times daily. Patient not taking: Reported  on 04/12/2016 09/16/15   Trixie DredgeEmily West, PA-C    Family History No family history on file.  Social History Social History  Substance Use Topics  . Smoking status: Never Smoker  . Smokeless tobacco: Never Used  . Alcohol use No     Allergies   Review of patient's allergies indicates no known allergies.   Review of Systems Review of Systems  Constitutional: Negative for fever.  Musculoskeletal: Positive for arthralgias and myalgias.       Right foot  Neurological: Negative for weakness and numbness.     Physical Exam Updated Vital Signs BP 117/74 (BP Location: Left Arm)   Pulse 84   Temp 98.5 F (36.9 C) (Oral)   Resp 18   Wt 89.6 kg   LMP 08/14/2015   SpO2 100%   BMI 31.88 kg/m   Physical Exam  Constitutional: She is oriented to person, place, and time. She appears well-developed and well-nourished.  HENT:  Head: Normocephalic and atraumatic.  Right Ear: External ear normal.  Left Ear: External ear normal.  Eyes: Conjunctivae are normal. No scleral icterus.  Neck: No tracheal deviation present.  Cardiovascular:  Pulses:      Dorsalis pedis pulses are 2+ on the right side, and 2+ on the left side.  Pulmonary/Chest: Effort normal. No respiratory distress.  Abdominal: She exhibits no distension.  Musculoskeletal: Normal range of motion.  Neurological: She is alert and oriented to person, place, and time.  Skin: Skin is warm and dry.  Erythema at the base of the right 5th toe without induration, fluctuance, or drainage   Psychiatric: She has a normal mood and affect. Her behavior is normal.  Nursing note and vitals reviewed.    ED Treatments / Results  DIAGNOSTIC STUDIES:  Oxygen Saturation is 100% on RA, normal by my interpretation.    COORDINATION OF CARE:  10:30 AM Discussed treatment plan with pt at bedside and pt agreed to plan.  Labs (all labs ordered are listed, but only abnormal results are displayed) Labs Reviewed - No data to display  EKG   EKG Interpretation None       Radiology No results found.  Procedures Procedures (including critical care time)  Medications Ordered in ED Medications - No data to display   Initial Impression / Assessment and Plan / ED Course  I have reviewed the triage vital signs and the nursing notes.  Pertinent labs & imaging results that were available during my care of the patient were reviewed by me and considered in my medical decision making (see chart for details).  Clinical Course    Patient presentation consistent with cellulitis. She is afebrile; no tachycardia, hypotension or other symptoms suggestive of severe infection. Pt advised to follow up for wound check in 2-3 days, sooner for worsening systemic symptoms, new lymphangitis, or significant spread of erythema past line of demarcation. Will discharge with Keflex. Tylenol for pain.  Return precautions discussed. Pt appears safe for discharge.    Final Clinical Impressions(s) / ED Diagnoses   Final diagnoses:  Cellulitis of toe of right foot    New Prescriptions New Prescriptions   CEPHALEXIN (KEFLEX) 500 MG CAPSULE    Take 1 capsule (500 mg total) by mouth 4 (four) times daily.   I personally performed the services described in this documentation, which was scribed in my presence. The recorded information has been reviewed and is accurate.     Cheri Fowler, PA-C 04/30/16 1054    Shaune Pollack, MD 05/02/16 [phone]

## 2016-04-30 NOTE — ED Triage Notes (Signed)
Pt. Stated, I woke up and had my right little toe pain and red and swollen.  Im also [redacted] weeks pregnant.  I did not hurt it.

## 2016-04-30 NOTE — ED Notes (Signed)
Pt started yesterday with right 5th toe swollen and red. States had "a crack" in skin between her 4th and 5th toes, "I have a lot of itching on my feet".

## 2016-05-12 ENCOUNTER — Inpatient Hospital Stay (HOSPITAL_COMMUNITY): Payer: Medicaid Other

## 2016-05-12 ENCOUNTER — Inpatient Hospital Stay (HOSPITAL_COMMUNITY)
Admission: AD | Admit: 2016-05-12 | Discharge: 2016-05-12 | Disposition: A | Discharge status: Home / Self Care | Payer: Medicaid Other | Source: Ambulatory Visit | Attending: Family Medicine | Admitting: Family Medicine

## 2016-05-12 ENCOUNTER — Encounter (HOSPITAL_COMMUNITY): Payer: Self-pay

## 2016-05-12 DIAGNOSIS — O2343 Unspecified infection of urinary tract in pregnancy, third trimester: Secondary | ICD-10-CM

## 2016-05-12 DIAGNOSIS — Z3A10 10 weeks gestation of pregnancy: Secondary | ICD-10-CM | POA: Insufficient documentation

## 2016-05-12 DIAGNOSIS — N760 Acute vaginitis: Secondary | ICD-10-CM | POA: Diagnosis not present

## 2016-05-12 DIAGNOSIS — N939 Abnormal uterine and vaginal bleeding, unspecified: Secondary | ICD-10-CM | POA: Diagnosis present

## 2016-05-12 DIAGNOSIS — B9689 Other specified bacterial agents as the cause of diseases classified elsewhere: Secondary | ICD-10-CM | POA: Diagnosis not present

## 2016-05-12 DIAGNOSIS — O2341 Unspecified infection of urinary tract in pregnancy, first trimester: Secondary | ICD-10-CM | POA: Diagnosis not present

## 2016-05-12 DIAGNOSIS — O209 Hemorrhage in early pregnancy, unspecified: Secondary | ICD-10-CM | POA: Insufficient documentation

## 2016-05-12 DIAGNOSIS — Z79899 Other long term (current) drug therapy: Secondary | ICD-10-CM | POA: Insufficient documentation

## 2016-05-12 LAB — CBC
HCT: 34.6 % — ABNORMAL LOW (ref 36.0–46.0)
HEMOGLOBIN: 12.2 g/dL (ref 12.0–15.0)
MCH: 30.7 pg (ref 26.0–34.0)
MCHC: 35.3 g/dL (ref 30.0–36.0)
MCV: 87.2 fL (ref 78.0–100.0)
PLATELETS: 287 10*3/uL (ref 150–400)
RBC: 3.97 MIL/uL (ref 3.87–5.11)
RDW: 13.1 % (ref 11.5–15.5)
WBC: 6.5 10*3/uL (ref 4.0–10.5)

## 2016-05-12 LAB — URINALYSIS, ROUTINE W REFLEX MICROSCOPIC
Glucose, UA: NEGATIVE mg/dL
KETONES UR: NEGATIVE mg/dL
NITRITE: NEGATIVE
PROTEIN: 100 mg/dL — AB
Specific Gravity, Urine: >1.03 — ABNORMAL HIGH (ref 1.005–1.030)
pH: 6 (ref 5.0–8.0)

## 2016-05-12 LAB — WET PREP, GENITAL
Sperm: NONE SEEN
Trich, Wet Prep: NONE SEEN
Yeast Wet Prep HPF POC: NONE SEEN

## 2016-05-12 LAB — URINE MICROSCOPIC-ADD ON

## 2016-05-12 LAB — HCG, QUANTITATIVE, PREGNANCY: HCG, BETA CHAIN, QUANT, S: 78467 m[IU]/mL — AB (ref <5)

## 2016-05-12 MED ORDER — CEPHALEXIN 500 MG PO CAPS: 500.0000 mg | ORAL_CAPSULE | Freq: Four times a day (QID) | ORAL | 0 refills | Status: DC

## 2016-05-12 MED ORDER — METRONIDAZOLE 500 MG PO TABS: 500.0000 mg | ORAL_TABLET | Freq: Two times a day (BID) | ORAL | 0 refills | Status: DC

## 2016-05-12 NOTE — MAU Provider Note (Signed)
History     CSN: [id number]  Arrival date and time: 05/12/16 [id number]   First Provider Initiated Contact with Patient 05/12/16 [phone]      No chief complaint on file.  HPI   Kristen Santos is a 30 y.o. female [phone] @ 1365w4d here with vaginal bleeding. This is the first episode. She noticed bright red vaginal bleeding this morning when she wiped.  She denies abdominal pain. She denies active bleeding at this time.  First appointment with [facility] is on aug 29  OB History    Gravida Para Term Preterm AB Living   4 1 1   2 1    SAB TAB Ectopic Multiple Live Births   2              Past Medical History:  Diagnosis Date  . Ileus [facility](HCC)     Past Surgical History:  Procedure Laterality Date  . ABDOMINAL SURGERY      History reviewed. No pertinent family history.  Social History  Substance Use Topics  . Smoking status: Never Smoker  . Smokeless tobacco: Never Used  . Alcohol use No    Allergies: No Known Allergies  Prescriptions Prior to Admission  Medication Sig Dispense Refill Last Dose  . cephALEXin (KEFLEX) 500 MG capsule Take 1 capsule (500 mg total) by mouth 4 (four) times daily. 28 capsule 0 Past Week at Unknown time  . norgestimate-ethinyl estradiol (ORTHO-CYCLEN,SPRINTEC,PREVIFEM) 0.25-35 MG-MCG tablet Take 1 tablet by mouth daily. (Patient not taking: Reported on 04/12/2016) 1 Package 2 Not Taking  . penicillin v potassium (VEETID) 500 MG tablet Take 1 tablet (500 mg total) by mouth 4 (four) times daily. (Patient not taking: Reported on 04/12/2016) 40 tablet 0 Not Taking   Results for orders placed or performed during the hospital encounter of 05/12/16 (from the past 48 hour(s))  Urinalysis, Routine w reflex microscopic ([facility])     Status: Abnormal   Collection Time: 05/12/16  5:16 AM  Result Value Ref Range   Color, Urine YELLOW YELLOW   APPearance CLOUDY (A) CLEAR   Specific Gravity, Urine >1.030 (H) 1.005 - 1.030   pH 6.0 5.0 - 8.0   Glucose, UA  NEGATIVE NEGATIVE mg/dL   Hgb urine dipstick LARGE (A) NEGATIVE   Bilirubin Urine SMALL (A) NEGATIVE   Ketones, ur NEGATIVE NEGATIVE mg/dL   Protein, ur 132100 (A) NEGATIVE mg/dL   Nitrite NEGATIVE NEGATIVE   Leukocytes, UA SMALL (A) NEGATIVE  Urine microscopic-add on     Status: Abnormal   Collection Time: 05/12/16  5:16 AM  Result Value Ref Range   Squamous Epithelial / LPF 0-5 (A) NONE SEEN   WBC, UA 6-30 0 - 5 WBC/hpf   RBC / HPF TOO NUMEROUS TO COUNT 0 - 5 RBC/hpf   Bacteria, UA FEW (A) NONE SEEN  CBC     Status: Abnormal   Collection Time: 05/12/16  5:59 AM  Result Value Ref Range   WBC 6.5 4.0 - 10.5 K/uL   RBC 3.97 3.87 - 5.11 MIL/uL   Hemoglobin 12.2 12.0 - 15.0 g/dL   HCT 44.034.6 (L) 10.236.0 - 72.546.0 %   MCV 87.2 78.0 - 100.0 fL   MCH 30.7 26.0 - 34.0 pg   MCHC 35.3 30.0 - 36.0 g/dL   RDW 36.613.1 44.011.5 - 34.715.5 %   Platelets 287 150 - 400 K/uL   [place] Ob Comp Less 14 Wks  Result Date: 05/12/2016 CLINICAL DATA:  30 y/o  F; bleeding.  EXAM: OBSTETRIC <14 WK ULTRASOUND TECHNIQUE: Transabdominal ultrasound was performed for evaluation of the gestation as well as the maternal uterus and adnexal regions. COMPARISON:  Pelvis ultrasound 09/22/2015 FINDINGS: Intrauterine gestational sac: Single Yolk sac:  No Embryo:  Yes Cardiac Activity: Yes Heart Rate: 157 bpm MSD:   mm    w     d CRL:   3.75 cm  mm   10 w 2 d                  US EDC: 12/06/2016 Subchorionic hemorrhage:  None visualized. Maternal uterus/adnexae: Ovaries are normal bilaterally. There is a cyst in the right ovary measuring 2.6 x 2.4 x 2.7 cm, likely a corpus luteum. Small left upper anechoic space within the placenta, or probably a small placental [place]. IMPRESSION: Single live intrauterine pregnancy. No acute abnormality identified. Electronically Signed   By: Mitzi HansenLance  Furusawa-Stratton M.D.   On: 05/12/2016 06:35   Review of Systems  Constitutional: Negative for chills and fever.  Gastrointestinal: Negative for abdominal pain.   Genitourinary: Positive for dysuria and urgency.   Physical Exam   Blood pressure 116/72, pulse 73, temperature 97.9 F (36.6 C), temperature source Oral, resp. rate 18, height 5\' 6"  (1.676 m), weight 200 lb (90.7 kg), last menstrual period 02/28/2016, SpO2 100 %, unknown if currently breastfeeding.  Physical Exam  Constitutional: She is oriented to person, place, and time. She appears well-developed and well-nourished. No distress.  HENT:  Head: Normocephalic.  Eyes: Pupils are equal, round, and reactive to light.  Neck: Neck supple.  GI: Soft. She exhibits no distension. There is no tenderness. There is no rebound.  Genitourinary:  Genitourinary Comments: Vagina - Small amount of white vaginal discharge, no odor  Cervix - No contact bleeding, no active bleeding  Bimanual exam: Cervix closed Uterus non tender, enlarged  Adnexa non tender, no masses bilaterally GC/Chlam, wet prep done Chaperone present for exam.   Musculoskeletal: Normal range of motion.  Neurological: She is alert and oriented to person, place, and time.  Skin: Skin is warm. She is not diaphoretic.   MAU Course  Procedures  none  MDM  O positive blood type hcg  US  Assessment and Plan   A:  1. UTI in pregnancy, antepartum, third trimester   2. BV (bacterial vaginosis)     P:  Discharge home in stable condition Urine culture pending Rx: Keflex, flagyl  Return to MAU as needed if symptoms worsen Keep appointment with [facility] Bleeding precautions   Duane LopeJennifer I Natthew Marlatt, NP 05/12/2016 7:18 AM

## 2016-05-12 NOTE — MAU Note (Addendum)
Pt states that she found out she was pregnant a few days ago. Noticed some blood with urination tonight. States that she "feels weird" and has some mild discomfort in abdomen with movement. Has burning with urination.

## 2016-05-12 NOTE — Discharge Instructions (Signed)
Bacterial Vaginosis Bacterial vaginosis is an infection of the vagina. It happens when too many germs (bacteria) grow in the vagina. Having this infection puts you at risk for getting other infections from sex. Treating this infection can help lower your risk for other infections, such as:   Chlamydia.  Gonorrhea.  HIV.  Herpes. HOME CARE  Take your medicine as told by your doctor.  Finish your medicine even if you start to feel better.  Tell your sex partner that you have an infection. They should see their doctor for treatment.  During treatment:  Avoid sex or use condoms correctly.  Do not douche.  Do not drink alcohol unless your doctor tells you it is ok.  Do not breastfeed unless your doctor tells you it is ok. GET HELP IF:  You are not getting better after 3 days of treatment.  You have more grey fluid (discharge) coming from your vagina than before.  You have more pain than before.  You have a fever. MAKE SURE YOU:   Understand these instructions.  Will watch your condition.  Will get help right away if you are not doing well or get worse.   This information is not intended to replace advice given to you by your health care provider. Make sure you discuss any questions you have with your health care provider.   Document Released: 06/15/2008 Document Revised: 09/27/2014 Document Reviewed: 04/18/2013 Elsevier Interactive Patient Education 2016 Elsevier Inc[employer] Pregnancy and Urinary Tract Infection A urinary tract infection (UTI) is a bacterial infection of the urinary tract. Infection of the urinary tract can include the ureters, kidneys (pyelonephritis), bladder (cystitis), and urethra (urethritis). All pregnant women should be screened for bacteria in the urinary tract. Identifying and treating a UTI will decrease the risk of preterm labor and developing more serious infections in both the mother and baby. CAUSES Bacteria germs cause almost all UTIs.  RISK  FACTORS Many factors can increase your chances of getting a UTI during pregnancy. These include:  Having a short urethra.  Poor toilet and hygiene habits.  Sexual intercourse.  Blockage of urine along the urinary tract.  Problems with the pelvic muscles or nerves.  Diabetes.  Obesity.  Bladder problems after having several children.  Previous history of UTI. SIGNS AND SYMPTOMS   Pain, burning, or a stinging feeling when urinating.  Suddenly feeling the need to urinate right away (urgency).  Loss of bladder control (urinary incontinence).  Frequent urination, more than is common with pregnancy.  Lower abdominal or back discomfort.  Cloudy urine.  Blood in the urine (hematuria).  Fever. When the kidneys are infected, the symptoms may be:  Back pain.  Flank pain on the right side more so than the left.  Fever.  Chills.  Nausea.  Vomiting. DIAGNOSIS  A urinary tract infection is usually diagnosed through urine tests. Additional tests and procedures are sometimes done. These may include:  Ultrasound exam of the kidneys, ureters, bladder, and urethra.  Looking in the bladder with a lighted tube (cystoscopy). TREATMENT Typically, UTIs can be treated with antibiotic medicines.  HOME CARE INSTRUCTIONS   Only take over-the-counter or prescription medicines as directed by your health care provider. If you were prescribed antibiotics, take them as directed. Finish them even if you start to feel better.  Drink enough fluids to keep your urine clear or pale yellow.  Do not have sexual intercourse until the infection is gone and your health care provider says it is okay.  Make  sure you are tested for UTIs throughout your pregnancy. These infections often come back.  °Preventing a UTI in the Future °· Practice good toilet habits. Always wipe from front to back. Use the tissue only once. °· Do not hold your urine. Empty your bladder as soon as possible when the urge  comes. °· Do not douche or use deodorant sprays. °· Wash with soap and warm water around the genital area and the anus. °· Empty your bladder before and after sexual intercourse. °· Wear underwear with a cotton crotch. °· Avoid caffeine and carbonated drinks. They can irritate the bladder. °· Drink cranberry juice or take cranberry pills. This may decrease the risk of getting a UTI. °· Do not drink alcohol. °· Keep all your appointments and tests as scheduled.  °SEEK MEDICAL CARE IF:  °· Your symptoms get worse. °· You are still having fevers 2 or more days after treatment begins. °· You have a rash. °· You feel that you are having problems with medicines prescribed. °· You have abnormal vaginal discharge. °SEEK IMMEDIATE MEDICAL CARE IF:  °· You have back or flank pain. °· You have chills. °· You have blood in your urine. °· You have nausea and vomiting. °· You have contractions of your uterus. °· You have a gush of fluid from the vagina. °MAKE SURE YOU: °· Understand these instructions.   °· Will watch your condition.   °· Will get help right away if you are not doing well or get worse.   °  °This information is not intended to replace advice given to you by your health care provider. Make sure you discuss any questions you have with your health care provider. °  °Document Released: 01/01/2011 Document Revised: 06/27/2013 Document Reviewed: 04/05/2013 °Elsevier Interactive Patient Education ©2016 Elsevier Inc. ° °

## 2016-05-13 LAB — GC/CHLAMYDIA PROBE AMP (~~LOC~~) NOT AT ARMC
Chlamydia: POSITIVE — AB
Neisseria Gonorrhea: NEGATIVE

## 2016-05-14 ENCOUNTER — Telehealth (HOSPITAL_COMMUNITY): Payer: Self-pay | Admitting: *Deleted

## 2016-05-14 DIAGNOSIS — A749 Chlamydial infection, unspecified: Secondary | ICD-10-CM

## 2016-05-14 DIAGNOSIS — O98811 Other maternal infectious and parasitic diseases complicating pregnancy, first trimester: Principal | ICD-10-CM

## 2016-05-14 LAB — CULTURE, OB URINE
Culture: 60000 — AB
Special Requests: NORMAL

## 2016-05-14 MED ORDER — AZITHROMYCIN 500 MG PO TABS: ORAL_TABLET | ORAL | 0 refills | Status: DC

## 2016-05-14 NOTE — Telephone Encounter (Signed)
Telephone call to patient regarding positive chlamydia culture, patient notified.  Rx routed to pharmacy per protocol.  Instructed patient to notify her partner for treatment and to abstain from sex for seven days post treatment.  Report faxed to health department.  

## 2016-05-18 ENCOUNTER — Encounter: Payer: Self-pay | Admitting: Obstetrics and Gynecology

## 2016-05-18 ENCOUNTER — Inpatient Hospital Stay (EMERGENCY_DEPARTMENT_HOSPITAL)
Admission: AD | Admit: 2016-05-18 | Discharge: 2016-05-18 | Disposition: A | Discharge status: Home / Self Care | Payer: Medicaid Other | Source: Ambulatory Visit | Attending: Family Medicine | Admitting: Family Medicine

## 2016-05-18 ENCOUNTER — Encounter (HOSPITAL_COMMUNITY): Payer: Self-pay

## 2016-05-18 ENCOUNTER — Inpatient Hospital Stay (HOSPITAL_COMMUNITY)
Admission: AD | Admit: 2016-05-18 | Discharge: 2016-05-18 | Disposition: A | Discharge status: Home / Self Care | Payer: Medicaid Other | Source: Ambulatory Visit | Attending: Obstetrics and Gynecology | Admitting: Obstetrics and Gynecology

## 2016-05-18 DIAGNOSIS — Z3A11 11 weeks gestation of pregnancy: Secondary | ICD-10-CM | POA: Insufficient documentation

## 2016-05-18 DIAGNOSIS — O2341 Unspecified infection of urinary tract in pregnancy, first trimester: Secondary | ICD-10-CM | POA: Diagnosis not present

## 2016-05-18 DIAGNOSIS — A749 Chlamydial infection, unspecified: Secondary | ICD-10-CM

## 2016-05-18 DIAGNOSIS — O98811 Other maternal infectious and parasitic diseases complicating pregnancy, first trimester: Principal | ICD-10-CM

## 2016-05-18 DIAGNOSIS — A568 Sexually transmitted chlamydial infection of other sites: Secondary | ICD-10-CM | POA: Diagnosis present

## 2016-05-18 DIAGNOSIS — O98311 Other infections with a predominantly sexual mode of transmission complicating pregnancy, first trimester: Secondary | ICD-10-CM | POA: Diagnosis not present

## 2016-05-18 DIAGNOSIS — R300 Dysuria: Secondary | ICD-10-CM | POA: Diagnosis not present

## 2016-05-18 DIAGNOSIS — R109 Unspecified abdominal pain: Secondary | ICD-10-CM | POA: Diagnosis present

## 2016-05-18 DIAGNOSIS — O98313 Other infections with a predominantly sexual mode of transmission complicating pregnancy, third trimester: Secondary | ICD-10-CM | POA: Diagnosis present

## 2016-05-18 HISTORY — DX: Other infections with a predominantly sexual mode of transmission complicating pregnancy, third trimester: A56.8

## 2016-05-18 HISTORY — DX: Urinary tract infection, site not specified: N39.0

## 2016-05-18 HISTORY — DX: Chlamydial infection, unspecified: A74.9

## 2016-05-18 LAB — URINE MICROSCOPIC-ADD ON

## 2016-05-18 LAB — URINALYSIS, ROUTINE W REFLEX MICROSCOPIC
BILIRUBIN URINE: NEGATIVE
Glucose, UA: NEGATIVE mg/dL
KETONES UR: 15 mg/dL — AB
NITRITE: NEGATIVE
PROTEIN: 100 mg/dL — AB
Specific Gravity, Urine: >1.03 — ABNORMAL HIGH (ref 1.005–1.030)
pH: 6 (ref 5.0–8.0)

## 2016-05-18 MED ORDER — CEPHALEXIN 500 MG PO CAPS: 500.0000 mg | ORAL_CAPSULE | Freq: Four times a day (QID) | ORAL | 0 refills | Status: DC

## 2016-05-18 MED ORDER — PHENAZOPYRIDINE HCL 200 MG PO TABS: 200.0000 mg | ORAL_TABLET | Freq: Three times a day (TID) | ORAL | 0 refills | Status: DC

## 2016-05-18 MED ORDER — CEPHALEXIN 500 MG PO CAPS
500.0000 mg | ORAL_CAPSULE | Freq: Once | ORAL | Status: AC
  Administered 2016-05-18: 500 mg via ORAL
  Filled 2016-05-18: qty 1

## 2016-05-18 MED ORDER — PHENAZOPYRIDINE HCL 100 MG PO TABS
200.0000 mg | ORAL_TABLET | Freq: Once | ORAL | Status: AC
  Administered 2016-05-18: 200 mg via ORAL
  Filled 2016-05-18: qty 2

## 2016-05-18 NOTE — Discharge Instructions (Signed)
Chlamydia, Female Chlamydia is an infection. It is spread through sexual contact. Chlamydia can be in different areas of the body. These areas include the cervix, urethra, throat, or rectum. You may not know you have chlamydia because many people never develop the symptoms. Chlamydia is not difficult to treat once you know you have it. However, if it is left untreated, chlamydia can lead to more serious health problems.  CAUSES  Chlamydia is caused by bacteria. It is a sexually transmitted disease. It is passed from an infected partner during intimate contact. This contact could be with the genitals, mouth, or rectal area. Chlamydia can also be passed from mothers to babies during birth. SIGNS AND SYMPTOMS  There may not be any symptoms. This is often the case early in the infection. If symptoms develop, they may include:  Mild pain and discomfort when urinating.  Redness, soreness, and swelling (inflammation) of the rectum.  Vaginal discharge.  Painful intercourse.  Abdominal pain.  Bleeding between menstrual periods. DIAGNOSIS  To diagnose this infection, your health care provider will do a pelvic exam. Cultures will be taken of the vagina, cervix, urine, and possibly the rectum to verify the diagnosis.  TREATMENT You will be given antibiotic medicines. If you are pregnant, certain types of antibiotics will need to be avoided. Any sexual partners should also be treated, even if they do not show symptoms. Your health care provider may test you for infection again 3 months after treatment. HOME CARE INSTRUCTIONS   Take your antibiotic medicine as directed by your health care provider. Finish the antibiotic even if you start to feel better.  Take medicines only as directed by your health care provider.  Inform any sexual partners about the infection. They should also be treated.  Do not have sexual contact until your health care provider tells you it is okay.  Get plenty of  rest.  Eat a well-balanced diet.  Drink enough fluids to keep your urine clear or pale yellow.  Keep all follow-up visits as directed by your health care provider. SEEK MEDICAL CARE IF:  You have painful urination.  You have abdominal pain.  You have vaginal discharge.  You have painful sexual intercourse.  You have bleeding between periods and after sex.  You have a fever. SEEK IMMEDIATE MEDICAL CARE IF:   You experience nausea or vomiting.  You experience excessive sweating (diaphoresis).  You have difficulty swallowing.   This information is not intended to replace advice given to you by your health care provider. Make sure you discuss any questions you have with your health care provider.   Document Released: 06/16/2005 Document Revised: 05/28/2015 Document Reviewed: 05/14/2013 Elsevier Interactive Patient Education 2016 Elsevier Inc.  

## 2016-05-18 NOTE — MAU Note (Addendum)
Had an appointment with [facility] but did not have her Medicaid yet so they wouldn't see her.  Was treated her for UTI and chlamydia.  Still has pain when she urinates and some blood when she wipes. Just finished the antibiotics.

## 2016-05-18 NOTE — MAU Provider Note (Signed)
History     CSN: [id number]  Arrival date and time: 05/18/16 1539   None     Chief Complaint  Patient presents with  . Abdominal Pain   HPI Pt is 29 [id number].N.[id number] who was seen on 8/23 with UTI in pregnancy and put on Keflex 500mg  QID for 5 days, which she has completed but still complains of dysuria.  Pt was also seen this morning in MAU and was treated for +Chlamydia with Zithromax. [facility] but [facility] is pending and pt . Pt denies back pain, chills, fever,  Review of pt's chart  And OB urine culture showed 60,000 colonies of proteus mirabilis sensitive  To all antibiotics tested including cefazolin and ceftriaxone RN note: Had an appointment with [facility] but did not have her Medicaid yet so they wouldn't see her.  Was treated her for UTI and chlamydia.  Still has pain when she urinates and some blood when she wipes. Just finished the antibiotics  Past Medical History:  Diagnosis Date  . Chlamydia   . Ileus (HCC)   . UTI (lower urinary tract infection)     Past Surgical History:  Procedure Laterality Date  . ABDOMINAL SURGERY      History reviewed. No pertinent family history.  Social History  Substance Use Topics  . Smoking status: Never Smoker  . Smokeless tobacco: Never Used  . Alcohol use No    Allergies: No Known Allergies  Prescriptions Prior to Admission  Medication Sig Dispense Refill Last Dose  . azithromycin (ZITHROMAX) 500 MG tablet Take two tablets by mouth once. 2 tablet 0   . cephALEXin (KEFLEX) 500 MG capsule Take 1 capsule (500 mg total) by mouth 4 (four) times daily. 20 capsule 0   . metroNIDAZOLE (FLAGYL) 500 MG tablet Take 1 tablet (500 mg total) by mouth 2 (two) times daily. 14 tablet 0     Review of Systems  Constitutional: Negative for chills and fever.  Gastrointestinal: Negative for abdominal pain, constipation, diarrhea, nausea and vomiting.  Genitourinary: Positive for dysuria.   Musculoskeletal: Negative for back pain.   Physical Exam   Blood pressure 114/58, pulse 81, temperature 98 F (36.7 C), temperature source Oral, resp. rate 18, height 5\' 6"  (1.676 m), weight 198 lb (89.8 kg), last menstrual period 02/28/2016, unknown if currently breastfeeding.  Physical Exam  Nursing note and vitals reviewed. Constitutional: She is oriented to person, place, and time. She appears well-developed and well-nourished.  HENT:  Head: Normocephalic.  Eyes: Pupils are equal, round, and reactive to light.  Neck: Normal range of motion.  Cardiovascular: Normal rate.   Respiratory: Effort normal.  No CVA tenderness  GI: Soft.  Musculoskeletal: Normal range of motion.  Neurological: She is alert and oriented to person, place, and time.  Skin: Skin is warm and dry.  Psychiatric: She has a normal mood and affect.    MAU Course  Procedures Results for orders placed or performed during the hospital encounter of 05/18/16 (from the past 24 hour(s))  Urinalysis, Routine w reflex microscopic (not [facility])     Status: Abnormal   Collection Time: 05/18/16  3:53 PM  Result Value Ref Range   Color, Urine YELLOW YELLOW   APPearance CLOUDY (A) CLEAR   Specific Gravity, Urine >1.030 (H) 1.005 - 1.030   pH 6.0 5.0 - 8.0   Glucose, UA NEGATIVE NEGATIVE mg/dL   Hgb urine dipstick LARGE (A) NEGATIVE   Bilirubin  Urine NEGATIVE NEGATIVE   Ketones, ur 15 (A) NEGATIVE mg/dL   Protein, ur 782 (A) NEGATIVE mg/dL   Nitrite NEGATIVE NEGATIVE   Leukocytes, UA TRACE (A) NEGATIVE  Urine microscopic-add on     Status: Abnormal   Collection Time: 05/18/16  3:53 PM  Result Value Ref Range   Squamous Epithelial / LPF 6-30 (A) NONE SEEN   WBC, UA 0-5 0 - 5 WBC/hpf   RBC / HPF 6-30 0 - 5 RBC/hpf   Bacteria, UA FEW (A) NONE SEEN   Urine-Other MUCOUS PRESENT   OB urine culture repeated- pending Pyridium 200mg  and another dose of Keflex 500mg   Assessment and Plan  Dysuria in pregnancy- pyridium  200mg  #5 UTI in pregnancy- another 5 days of Keflex 500mg  QID Pt to return for increase in sx, fever, chills or back pain Urine culture pending F/u with [facility]  Desiree Daise 05/18/2016, 4:32 PM

## 2016-05-18 NOTE — MAU Provider Note (Signed)
S: 30 y.o. [id number] @[redacted]w[redacted]d  presents to MAU with questions about her medications. She was seen in MAU on 8/23 and diagnosed with UTI and BV and prescribed Keflex and Flagyl. Then, she received a call notifying her of a positive chlamydia test also and azithromycin was sent to her pharmacy.  She reports the pharmacy suggested she wait and not take the azithromycin yet.  She came in to make sure she is taking her medications correctly. She completed her 1 week of Keflex today but has not taken the azithromycin or the Flagyl.  She reports light intermittent spotting, unchanged since her previous MAU visit and denies pain, n/v, or fever today.   O: Patient Vitals for the past 24 hrs:  BP Temp Temp src Pulse Resp  05/18/16 0601 95/75 98 F (36.7 C) Oral 77 18   VS reviewed, nursing note reviewed,  Constitutional: well developed, well nourished, no distress HEENT: normocephalic CV: normal rate Pulm/chest wall: normal effort Abdomen: soft Neuro: alert and oriented x 3 Skin: warm, dry Psych: affect normal   MDM:  Discussed medications with pt. Recommend pt take azithromycin today and begin 1 week course of Flagyl.  Take both medications with food.  [facility].  Bleeding and first trimester precautions reviewed. Pt stable at time of discharge.   A: 1. Chlamydia infection complicating pregnancy in first trimester     P: [facility] home [facility] Return to MAU for emergencies  LEFTWICH-KIRBY, Taelyr Jantz, CNM 6:23 AM

## 2016-05-18 NOTE — Discharge Instructions (Signed)
Dysuria Dysuria is pain or discomfort while urinating. The pain or discomfort may be felt in the tube that carries urine out of the bladder (urethra) or in the surrounding tissue of the genitals. The pain may also be felt in the groin area, lower abdomen, and lower back. You may have to urinate frequently or have the sudden feeling that you have to urinate (urgency). Dysuria can affect both men and women, but is more common in women. Dysuria can be caused by many different things, including:  Urinary tract infection in women.  Infection of the kidney or bladder.  Kidney stones or bladder stones.  Certain sexually transmitted infections (STIs), such as chlamydia.  Dehydration.  Inflammation of the vagina.  Use of certain medicines.  Use of certain soaps or scented products that cause irritation. HOME CARE INSTRUCTIONS Watch your dysuria for any changes. The following actions may help to reduce any discomfort you are feeling:  Drink enough fluid to keep your urine clear or pale yellow.  Empty your bladder often. Avoid holding urine for long periods of time.  After a bowel movement or urination, women should cleanse from front to back, using each tissue only once.  Empty your bladder after sexual intercourse.  Take medicines only as directed by your health care provider.  If you were prescribed an antibiotic medicine, finish it all even if you start to feel better.  Avoid caffeine, tea, and alcohol. They can irritate the bladder and make dysuria worse. In men, alcohol may irritate the prostate.  Keep all follow-up visits as directed by your health care provider. This is important.  If you had any tests done to find the cause of dysuria, it is your responsibility to obtain your test results. Ask the lab or department performing the test when and how you will get your results. Talk with your health care provider if you have any questions about your results. SEEK MEDICAL CARE  IF:  You develop pain in your back or sides.  You have a fever.  You have nausea or vomiting.  You have blood in your urine.  You are not urinating as often as you usually do. SEEK IMMEDIATE MEDICAL CARE IF:  You pain is severe and not relieved with medicines.  You are unable to hold down any fluids.  You or someone else notices a change in your mental function.  You have a rapid heartbeat at rest.  You have shaking or chills.  You feel extremely weak.   This information is not intended to replace advice given to you by your health care provider. Make sure you discuss any questions you have with your health care provider.   Document Released: 06/04/2004 Document Revised: 09/27/2014 Document Reviewed: 05/02/2014 Elsevier Interactive Patient Education 2016 [employer] Safe Medications in Pregnancy   Acne: Benzoyl Peroxide Salicylic Acid  Backache/Headache: Tylenol: 2 regular strength every 4 hours OR              2 Extra strength every 6 hours  Colds/Coughs/Allergies: Benadryl (alcohol free) 25 mg every 6 hours as needed Breath right strips Claritin Cepacol throat lozenges Chloraseptic throat spray Cold-Eeze- up to three times per day Cough drops, alcohol free Flonase (by prescription only) Guaifenesin Mucinex Robitussin DM (plain only, alcohol free) Saline nasal spray/drops Sudafed (pseudoephedrine) & Actifed ** use only after [redacted] weeks gestation and if you do not have high blood pressure Tylenol Vicks Vaporub Zinc lozenges Zyrtec   Constipation: Colace Ducolax suppositories Fleet enema Glycerin suppositories  Metamucil Milk of magnesia Miralax Senokot Smooth move tea  Diarrhea: Kaopectate Imodium A-D  *NO pepto Bismol  Hemorrhoids: Anusol Anusol HC Preparation H Tucks  Indigestion: Tums Maalox Mylanta Zantac  Pepcid  Insomnia: Benadryl (alcohol free) 25mg  every 6 hours as needed Tylenol PM Unisom, no Gelcaps  Leg  Cramps: Tums MagGel  Nausea/Vomiting:  Bonine Dramamine Emetrol Ginger extract Sea bands Meclizine  Nausea medication to take during pregnancy:  Unisom (doxylamine succinate 25 mg tablets) Take one tablet daily at bedtime. If symptoms are not adequately controlled, the dose can be increased to a maximum recommended dose of two tablets daily (1/2 tablet in the morning, 1/2 tablet mid-afternoon and one at bedtime). Vitamin B6 100mg  tablets. Take one tablet twice a day (up to 200 mg per day).  Skin Rashes: Aveeno products Benadryl cream or 25mg  every 6 hours as needed Calamine Lotion 1% cortisone cream  Yeast infection: Gyne-lotrimin 7 Monistat 7   **If taking multiple medications, please check labels to avoid duplicating the same active ingredients **take medication as directed on the label ** Do not exceed 4000 mg of tylenol in 24 hours **Do not take medications that contain aspirin or ibuprofen

## 2016-05-18 NOTE — MAU Note (Signed)
Urine sent to lab 

## 2016-05-18 NOTE — MAU Note (Signed)
Pt presents stating she was treated for a UTI last week but is still having bleeding when she wipes. Still having burning with urination. States she was told she has chlamydia and was told to come in and be sure she was prescribed the right medication to treat it so she has not taken the prescription yet. Denies pain

## 2016-05-19 LAB — CULTURE, OB URINE

## 2016-06-06 DIAGNOSIS — Z3490 Encounter for supervision of normal pregnancy, unspecified, unspecified trimester: Secondary | ICD-10-CM | POA: Insufficient documentation

## 2016-06-06 HISTORY — DX: Encounter for supervision of normal pregnancy, unspecified, unspecified trimester: Z34.90

## 2016-06-08 ENCOUNTER — Encounter: Payer: Self-pay | Admitting: Obstetrics and Gynecology

## 2016-06-08 ENCOUNTER — Ambulatory Visit (INDEPENDENT_AMBULATORY_CARE_PROVIDER_SITE_OTHER): Payer: Medicaid Other | Admitting: Obstetrics and Gynecology

## 2016-06-08 VITALS — BP 100/66 | HR 74 | Temp 98.0°F | Wt 194.8 lb

## 2016-06-08 DIAGNOSIS — A749 Chlamydial infection, unspecified: Secondary | ICD-10-CM | POA: Diagnosis not present

## 2016-06-08 DIAGNOSIS — Z3480 Encounter for supervision of other normal pregnancy, unspecified trimester: Secondary | ICD-10-CM | POA: Diagnosis not present

## 2016-06-08 DIAGNOSIS — Z23 Encounter for immunization: Secondary | ICD-10-CM

## 2016-06-08 DIAGNOSIS — O98311 Other infections with a predominantly sexual mode of transmission complicating pregnancy, first trimester: Secondary | ICD-10-CM | POA: Diagnosis not present

## 2016-06-08 DIAGNOSIS — O98811 Other maternal infectious and parasitic diseases complicating pregnancy, first trimester: Principal | ICD-10-CM

## 2016-06-08 NOTE — Patient Instructions (Signed)
Second Trimester of Pregnancy The second trimester is from week 13 through week 28, months 4 through 6. The second trimester is often a time when you feel your best. Your body has also adjusted to being pregnant, and you begin to feel better physically. Usually, morning sickness has lessened or quit completely, you may have more energy, and you may have an increase in appetite. The second trimester is also a time when the fetus is growing rapidly. At the end of the sixth month, the fetus is about 9 inches long and weighs about 1 pounds. You will likely begin to feel the baby move (quickening) between 18 and 20 weeks of the pregnancy. BODY CHANGES Your body goes through many changes during pregnancy. The changes vary from woman to woman.   Your weight will continue to increase. You will notice your lower abdomen bulging out.  You may begin to get stretch marks on your hips, abdomen, and breasts.  You may develop headaches that can be relieved by medicines approved by your health care provider.  You may urinate more often because the fetus is pressing on your bladder.  You may develop or continue to have heartburn as a result of your pregnancy.  You may develop constipation because certain hormones are causing the muscles that push waste through your intestines to slow down.  You may develop hemorrhoids or swollen, bulging veins (varicose veins).  You may have back pain because of the weight gain and pregnancy hormones relaxing your joints between the bones in your pelvis and as a result of a shift in weight and the muscles that support your balance.  Your breasts will continue to grow and be tender.  Your gums may bleed and may be sensitive to brushing and flossing.  Dark spots or blotches (chloasma, mask of pregnancy) may develop on your face. This will likely fade after the baby is born.  A dark line from your belly button to the pubic area (linea nigra) may appear. This will likely  fade after the baby is born.  You may have changes in your hair. These can include thickening of your hair, rapid growth, and changes in texture. Some women also have hair loss during or after pregnancy, or hair that feels dry or thin. Your hair will most likely return to normal after your baby is born. WHAT TO EXPECT AT YOUR PRENATAL VISITS During a routine prenatal visit:  You will be weighed to make sure you and the fetus are growing normally.  Your blood pressure will be taken.  Your abdomen will be measured to track your baby's growth.  The fetal heartbeat will be listened to.  Any test results from the previous visit will be discussed. Your health care provider may ask you:  How you are feeling.  If you are feeling the baby move.  If you have had any abnormal symptoms, such as leaking fluid, bleeding, severe headaches, or abdominal cramping.  If you are using any tobacco products, including cigarettes, chewing tobacco, and electronic cigarettes.  If you have any questions. Other tests that may be performed during your second trimester include:  Blood tests that check for:  Low iron levels (anemia).  Gestational diabetes (between 24 and 28 weeks).  Rh antibodies.  Urine tests to check for infections, diabetes, or protein in the urine.  An ultrasound to confirm the proper growth and development of the baby.  An amniocentesis to check for possible genetic problems.  Fetal screens for spina bifida   and Down syndrome.  HIV (human immunodeficiency virus) testing. Routine prenatal testing includes screening for HIV, unless you choose not to have this test. HOME CARE INSTRUCTIONS   Avoid all smoking, herbs, alcohol, and unprescribed drugs. These chemicals affect the formation and growth of the baby.  Do not use any tobacco products, including cigarettes, chewing tobacco, and electronic cigarettes. If you need help quitting, ask your health care provider. You may receive  counseling support and other resources to help you quit.  Follow your health care provider's instructions regarding medicine use. There are medicines that are either safe or unsafe to take during pregnancy.  Exercise only as directed by your health care provider. Experiencing uterine cramps is a good sign to stop exercising.  Continue to eat regular, healthy meals.  Wear a good support bra for breast tenderness.  Do not use hot tubs, steam rooms, or saunas.  Wear your seat belt at all times when driving.  Avoid raw meat, uncooked cheese, cat litter boxes, and soil used by cats. These carry germs that can cause birth defects in the baby.  Take your prenatal vitamins.  Take 1500-2000 mg of calcium daily starting at the 20th week of pregnancy until you deliver your baby.  Try taking a stool softener (if your health care provider approves) if you develop constipation. Eat more high-fiber foods, such as fresh vegetables or fruit and whole grains. Drink plenty of fluids to keep your urine clear or pale yellow.  Take warm sitz baths to soothe any pain or discomfort caused by hemorrhoids. Use hemorrhoid cream if your health care provider approves.  If you develop varicose veins, wear support hose. Elevate your feet for 15 minutes, 3-4 times a day. Limit salt in your diet.  Avoid heavy lifting, wear low heel shoes, and practice good posture.  Rest with your legs elevated if you have leg cramps or low back pain.  Visit your dentist if you have not gone yet during your pregnancy. Use a soft toothbrush to brush your teeth and be gentle when you floss.  A sexual relationship may be continued unless your health care provider directs you otherwise.  Continue to go to all your prenatal visits as directed by your health care provider. SEEK MEDICAL CARE IF:   You have dizziness.  You have mild pelvic cramps, pelvic pressure, or nagging pain in the abdominal area.  You have persistent nausea,  vomiting, or diarrhea.  You have a bad smelling vaginal discharge.  You have pain with urination. SEEK IMMEDIATE MEDICAL CARE IF:   You have a fever.  You are leaking fluid from your vagina.  You have spotting or bleeding from your vagina.  You have severe abdominal cramping or pain.  You have rapid weight gain or loss.  You have shortness of breath with chest pain.  You notice sudden or extreme swelling of your face, hands, ankles, feet, or legs.  You have not felt your baby move in over an hour.  You have severe headaches that do not go away with medicine.  You have vision changes.   This information is not intended to replace advice given to you by your health care provider. Make sure you discuss any questions you have with your health care provider.   Document Released: 08/31/2001 Document Revised: 09/27/2014 Document Reviewed: 11/07/2012 Elsevier Interactive Patient Education 2016 [employer]  Contraception Choices Contraception (birth control) is the use of any methods or devices to prevent pregnancy. Below are some methods to   help avoid pregnancy. HORMONAL METHODS   Contraceptive implant. This is a thin, plastic tube containing progesterone hormone. It does not contain estrogen hormone. Your health care provider inserts the tube in the inner part of the upper arm. The tube can remain in place for up to 3 years. After 3 years, the implant must be removed. The implant prevents the ovaries from releasing an egg (ovulation), thickens the cervical mucus to prevent sperm from entering the uterus, and thins the lining of the inside of the uterus.  Progesterone-only injections. These injections are given every 3 months by your health care provider to prevent pregnancy. This synthetic progesterone hormone stops the ovaries from releasing eggs. It also thickens cervical mucus and changes the uterine lining. This makes it harder for sperm to survive in the uterus.  Birth  control pills. These pills contain estrogen and progesterone hormone. They work by preventing the ovaries from releasing eggs (ovulation). They also cause the cervical mucus to thicken, preventing the sperm from entering the uterus. Birth control pills are prescribed by a health care provider.Birth control pills can also be used to treat heavy periods.  Minipill. This type of birth control pill contains only the progesterone hormone. They are taken every day of each month and must be prescribed by your health care provider.  Birth control patch. The patch contains hormones similar to those in birth control pills. It must be changed once a week and is prescribed by a health care provider.  Vaginal ring. The ring contains hormones similar to those in birth control pills. It is left in the vagina for 3 weeks, removed for 1 week, and then a new one is put back in place. The patient must be comfortable inserting and removing the ring from the vagina.A health care provider's prescription is necessary.  Emergency contraception. Emergency contraceptives prevent pregnancy after unprotected sexual intercourse. This pill can be taken right after sex or up to 5 days after unprotected sex. It is most effective the sooner you take the pills after having sexual intercourse. Most emergency contraceptive pills are available without a prescription. Check with your pharmacist. Do not use emergency contraception as your only form of birth control. BARRIER METHODS   Female condom. This is a thin sheath (latex or rubber) that is worn over the penis during sexual intercourse. It can be used with spermicide to increase effectiveness.  Female condom. This is a soft, loose-fitting sheath that is put into the vagina before sexual intercourse.  Diaphragm. This is a soft, latex, dome-shaped barrier that must be fitted by a health care provider. It is inserted into the vagina, along with a spermicidal jelly. It is inserted before  intercourse. The diaphragm should be left in the vagina for 6 to 8 hours after intercourse.  Cervical cap. This is a round, soft, latex or plastic cup that fits over the cervix and must be fitted by a health care provider. The cap can be left in place for up to 48 hours after intercourse.  Sponge. This is a soft, circular piece of polyurethane foam. The sponge has spermicide in it. It is inserted into the vagina after wetting it and before sexual intercourse.  Spermicides. These are chemicals that kill or block sperm from entering the cervix and uterus. They come in the form of creams, jellies, suppositories, foam, or tablets. They do not require a prescription. They are inserted into the vagina with an applicator before having sexual intercourse. The process must be repeated every   time you have sexual intercourse. INTRAUTERINE CONTRACEPTION  Intrauterine device (IUD). This is a T-shaped device that is put in a woman's uterus during a menstrual period to prevent pregnancy. There are 2 types:  Copper IUD. This type of IUD is wrapped in copper wire and is placed inside the uterus. Copper makes the uterus and fallopian tubes produce a fluid that kills sperm. It can stay in place for 10 years.  Hormone IUD. This type of IUD contains the hormone progestin (synthetic progesterone). The hormone thickens the cervical mucus and prevents sperm from entering the uterus, and it also thins the uterine lining to prevent implantation of a fertilized egg. The hormone can weaken or kill the sperm that get into the uterus. It can stay in place for 3-5 years, depending on which type of IUD is used. PERMANENT METHODS OF CONTRACEPTION  Female tubal ligation. This is when the woman's fallopian tubes are surgically sealed, tied, or blocked to prevent the egg from traveling to the uterus.  Hysteroscopic sterilization. This involves placing a small coil or insert into each fallopian tube. Your doctor uses a technique  called hysteroscopy to do the procedure. The device causes scar tissue to form. This results in permanent blockage of the fallopian tubes, so the sperm cannot fertilize the egg. It takes about 3 months after the procedure for the tubes to become blocked. You must use another form of birth control for these 3 months.  Female sterilization. This is when the female has the tubes that carry sperm tied off (vasectomy).This blocks sperm from entering the vagina during sexual intercourse. After the procedure, the man can still ejaculate fluid (semen). NATURAL PLANNING METHODS  Natural family planning. This is not having sexual intercourse or using a barrier method (condom, diaphragm, cervical cap) on days the woman could become pregnant.  Calendar method. This is keeping track of the length of each menstrual cycle and identifying when you are fertile.  Ovulation method. This is avoiding sexual intercourse during ovulation.  Symptothermal method. This is avoiding sexual intercourse during ovulation, using a thermometer and ovulation symptoms.  Post-ovulation method. This is timing sexual intercourse after you have ovulated. Regardless of which type or method of contraception you choose, it is important that you use condoms to protect against the transmission of sexually transmitted infections (STIs). Talk with your health care provider about which form of contraception is most appropriate for you.   This information is not intended to replace advice given to you by your health care provider. Make sure you discuss any questions you have with your health care provider.   Document Released: 09/06/2005 Document Revised: 09/11/2013 Document Reviewed: 03/01/2013 Elsevier Interactive Patient Education 2016 [employer]  Breastfeeding Deciding to breastfeed is one of the best choices you can make for you and your baby. A change in hormones during pregnancy causes your breast tissue to grow and increases the  number and size of your milk ducts. These hormones also allow proteins, sugars, and fats from your blood supply to make breast milk in your milk-producing glands. Hormones prevent breast milk from being released before your baby is born as well as prompt milk flow after birth. Once breastfeeding has begun, thoughts of your baby, as well as his or her sucking or crying, can stimulate the release of milk from your milk-producing glands.  BENEFITS OF BREASTFEEDING For Your Baby  Your first milk (colostrum) helps your baby's digestive system function better.  There are antibodies in your milk that help   your baby fight off infections.  Your baby has a lower incidence of asthma, allergies, and sudden infant death syndrome.  The nutrients in breast milk are better for your baby than infant formulas and are designed uniquely for your baby's needs.  Breast milk improves your baby's brain development.  Your baby is less likely to develop other conditions, such as childhood obesity, asthma, or type 2 diabetes mellitus. For You  Breastfeeding helps to create a very special bond between you and your baby.  Breastfeeding is convenient. Breast milk is always available at the correct temperature and costs nothing.  Breastfeeding helps to burn calories and helps you lose the weight gained during pregnancy.  Breastfeeding makes your uterus contract to its prepregnancy size faster and slows bleeding (lochia) after you give birth.   Breastfeeding helps to lower your risk of developing type 2 diabetes mellitus, osteoporosis, and breast or ovarian cancer later in life. SIGNS THAT YOUR BABY IS HUNGRY Early Signs of Hunger  Increased alertness or activity.  Stretching.  Movement of the head from side to side.  Movement of the head and opening of the mouth when the corner of the mouth or cheek is stroked (rooting).  Increased sucking sounds, smacking lips, cooing, sighing, or squeaking.  Hand-to-mouth  movements.  Increased sucking of fingers or hands. Late Signs of Hunger  Fussing.  Intermittent crying. Extreme Signs of Hunger Signs of extreme hunger will require calming and consoling before your baby will be able to breastfeed successfully. Do not wait for the following signs of extreme hunger to occur before you initiate breastfeeding:  Restlessness.  A loud, strong cry.  Screaming. BREASTFEEDING BASICS Breastfeeding Initiation  Find a comfortable place to sit or lie down, with your neck and back well supported.  Place a pillow or rolled up blanket under your baby to bring him or her to the level of your breast (if you are seated). Nursing pillows are specially designed to help support your arms and your baby while you breastfeed.  Make sure that your baby's abdomen is facing your abdomen.  Gently massage your breast. With your fingertips, massage from your chest wall toward your nipple in a circular motion. This encourages milk flow. You may need to continue this action during the feeding if your milk flows slowly.  Support your breast with 4 fingers underneath and your thumb above your nipple. Make sure your fingers are well away from your nipple and your baby's mouth.  Stroke your baby's lips gently with your finger or nipple.  When your baby's mouth is open wide enough, quickly bring your baby to your breast, placing your entire nipple and as much of the colored area around your nipple (areola) as possible into your baby's mouth.  More areola should be visible above your baby's upper lip than below the lower lip.  Your baby's tongue should be between his or her lower gum and your breast.  Ensure that your baby's mouth is correctly positioned around your nipple (latched). Your baby's lips should create a seal on your breast and be turned out (everted).  It is common for your baby to suck about 2-3 minutes in order to start the flow of breast milk. Latching Teaching  your baby how to latch on to your breast properly is very important. An improper latch can cause nipple pain and decreased milk supply for you and poor weight gain in your baby. Also, if your baby is not latched onto your nipple properly, he   or she may swallow some air during feeding. This can make your baby fussy. Burping your baby when you switch breasts during the feeding can help to get rid of the air. However, teaching your baby to latch on properly is still the best way to prevent fussiness from swallowing air while breastfeeding. Signs that your baby has successfully latched on to your nipple:  Silent tugging or silent sucking, without causing you pain.  Swallowing heard between every 3-4 sucks.  Muscle movement above and in front of his or her ears while sucking. Signs that your baby has not successfully latched on to nipple:  Sucking sounds or smacking sounds from your baby while breastfeeding.  Nipple pain. If you think your baby has not latched on correctly, slip your finger into the corner of your baby's mouth to break the suction and place it between your baby's gums. Attempt breastfeeding initiation again. Signs of Successful Breastfeeding Signs from your baby:  A gradual decrease in the number of sucks or complete cessation of sucking.  Falling asleep.  Relaxation of his or her body.  Retention of a small amount of milk in his or her mouth.  Letting go of your breast by himself or herself. Signs from you:  Breasts that have increased in firmness, weight, and size 1-3 hours after feeding.  Breasts that are softer immediately after breastfeeding.  Increased milk volume, as well as a change in milk consistency and color by the fifth day of breastfeeding.  Nipples that are not sore, cracked, or bleeding. Signs That Your Baby is Getting Enough Milk  Wetting at least 3 diapers in a 24-hour period. The urine should be clear and pale yellow by age 5 days.  At least 3  stools in a 24-hour period by age 5 days. The stool should be soft and yellow.  At least 3 stools in a 24-hour period by age 7 days. The stool should be seedy and yellow.  No loss of weight greater than 10% of birth weight during the first 3 days of age.  Average weight gain of 4-7 ounces (113-198 g) per week after age 4 days.  Consistent daily weight gain by age 5 days, without weight loss after the age of 2 weeks. After a feeding, your baby may spit up a small amount. This is common. BREASTFEEDING FREQUENCY AND DURATION Frequent feeding will help you make more milk and can prevent sore nipples and breast engorgement. Breastfeed when you feel the need to reduce the fullness of your breasts or when your baby shows signs of hunger. This is called "breastfeeding on demand." Avoid introducing a pacifier to your baby while you are working to establish breastfeeding (the first 4-6 weeks after your baby is born). After this time you may choose to use a pacifier. Research has shown that pacifier use during the first year of a baby's life decreases the risk of sudden infant death syndrome (SIDS). Allow your baby to feed on each breast as long as he or she wants. Breastfeed until your baby is finished feeding. When your baby unlatches or falls asleep while feeding from the first breast, offer the second breast. Because newborns are often sleepy in the first few weeks of life, you may need to awaken your baby to get him or her to feed. Breastfeeding times will vary from baby to baby. However, the following rules can serve as a guide to help you ensure that your baby is properly fed:  Newborns (babies 4 weeks   of age or younger) may breastfeed every 1-3 hours.  Newborns should not go longer than 3 hours during the day or 5 hours during the night without breastfeeding.  You should breastfeed your baby a minimum of 8 times in a 24-hour period until you begin to introduce solid foods to your baby at around 6  months of age. BREAST MILK PUMPING Pumping and storing breast milk allows you to ensure that your baby is exclusively fed your breast milk, even at times when you are unable to breastfeed. This is especially important if you are going back to work while you are still breastfeeding or when you are not able to be present during feedings. Your lactation consultant can give you guidelines on how long it is safe to store breast milk. A breast pump is a machine that allows you to pump milk from your breast into a sterile bottle. The pumped breast milk can then be stored in a refrigerator or freezer. Some breast pumps are operated by hand, while others use electricity. Ask your lactation consultant which type will work best for you. Breast pumps can be purchased, but some hospitals and breastfeeding support groups lease breast pumps on a monthly basis. A lactation consultant can teach you how to hand express breast milk, if you prefer not to use a pump. CARING FOR YOUR BREASTS WHILE YOU BREASTFEED Nipples can become dry, cracked, and sore while breastfeeding. The following recommendations can help keep your breasts moisturized and healthy:  Avoid using soap on your nipples.  Wear a supportive bra. Although not required, special nursing bras and tank tops are designed to allow access to your breasts for breastfeeding without taking off your entire bra or top. Avoid wearing underwire-style bras or extremely tight bras.  Air dry your nipples for 3-4minutes after each feeding.  Use only cotton bra pads to absorb leaked breast milk. Leaking of breast milk between feedings is normal.  Use lanolin on your nipples after breastfeeding. Lanolin helps to maintain your skin's normal moisture barrier. If you use pure lanolin, you do not need to wash it off before feeding your baby again. Pure lanolin is not toxic to your baby. You may also hand express a few drops of breast milk and gently massage that milk into your  nipples and allow the milk to air dry. In the first few weeks after giving birth, some women experience extremely full breasts (engorgement). Engorgement can make your breasts feel heavy, warm, and tender to the touch. Engorgement peaks within 3-5 days after you give birth. The following recommendations can help ease engorgement:  Completely empty your breasts while breastfeeding or pumping. You may want to start by applying warm, moist heat (in the shower or with warm water-soaked hand towels) just before feeding or pumping. This increases circulation and helps the milk flow. If your baby does not completely empty your breasts while breastfeeding, pump any extra milk after he or she is finished.  Wear a snug bra (nursing or regular) or tank top for 1-2 days to signal your body to slightly decrease milk production.  Apply ice packs to your breasts, unless this is too uncomfortable for you.  Make sure that your baby is latched on and positioned properly while breastfeeding. If engorgement persists after 48 hours of following these recommendations, contact your health care provider or a lactation consultant. OVERALL HEALTH CARE RECOMMENDATIONS WHILE BREASTFEEDING  Eat healthy foods. Alternate between meals and snacks, eating 3 of each per day. Because what   you eat affects your breast milk, some of the foods may make your baby more irritable than usual. Avoid eating these foods if you are sure that they are negatively affecting your baby.  Drink milk, fruit juice, and water to satisfy your thirst (about 10 glasses a day).  Rest often, relax, and continue to take your prenatal vitamins to prevent fatigue, stress, and anemia.  Continue breast self-awareness checks.  Avoid chewing and smoking tobacco. Chemicals from cigarettes that pass into breast milk and exposure to secondhand smoke may harm your baby.  Avoid alcohol and drug use, including marijuana. Some medicines that may be harmful to your  baby can pass through breast milk. It is important to ask your health care provider before taking any medicine, including all over-the-counter and prescription medicine as well as vitamin and herbal supplements. It is possible to become pregnant while breastfeeding. If birth control is desired, ask your health care provider about options that will be safe for your baby. SEEK MEDICAL CARE IF:  You feel like you want to stop breastfeeding or have become frustrated with breastfeeding.  You have painful breasts or nipples.  Your nipples are cracked or bleeding.  Your breasts are red, tender, or warm.  You have a swollen area on either breast.  You have a fever or chills.  You have nausea or vomiting.  You have drainage other than breast milk from your nipples.  Your breasts do not become full before feedings by the fifth day after you give birth.  You feel sad and depressed.  Your baby is too sleepy to eat well.  Your baby is having trouble sleeping.   Your baby is wetting less than 3 diapers in a 24-hour period.  Your baby has less than 3 stools in a 24-hour period.  Your baby's skin or the white part of his or her eyes becomes yellow.   Your baby is not gaining weight by 5 days of age. SEEK IMMEDIATE MEDICAL CARE IF:  Your baby is overly tired (lethargic) and does not want to wake up and feed.  Your baby develops an unexplained fever.   This information is not intended to replace advice given to you by your health care provider. Make sure you discuss any questions you have with your health care provider.   Document Released: 09/06/2005 Document Revised: 05/28/2015 Document Reviewed: 02/28/2013 Elsevier Interactive Patient Education 2016 [employer]vier Inc.  

## 2016-06-08 NOTE — Progress Notes (Signed)
  Subjective:    Kristen Santos is a [id number] 1243w3d being seen today for her first obstetrical visit.  Her obstetrical history is significant for chlamydia infection in the first trimester and obesity. Patient does intend to breast feed. Pregnancy history fully reviewed.  Patient reports decrease in appetite for which she self treated with THC. She reports that she quit  Vitals:   06/08/16 1413  BP: 100/66  Pulse: 74  Temp: 98 F (36.7 C)  Weight: 194 lb 12.8 oz (88.4 kg)    HISTORY: OB History  Gravida Para Term Preterm AB Living  4 1 1   2 1   SAB TAB Ectopic Multiple Live Births  2            # Outcome Date GA Lbr Len/2nd Weight Sex Delivery Anes PTL Lv  4 Current           3 SAB           2 SAB           1 Term      Vag-Spont        Past Medical History:  Diagnosis Date  . Chlamydia   . Ileus (HCC)   . UTI (lower urinary tract infection)    Past Surgical History:  Procedure Laterality Date  . ABDOMINAL SURGERY     Family History  Problem Relation Age of Onset  . Hypertension Mother   . Kidney disease Father      Exam    Uterus:   approximately 14-week in size  Pelvic Exam:    Perineum: No Hemorrhoids, Normal Perineum   Vulva: normal   Vagina:  normal mucosa, normal discharge   pH:    Cervix: multiparous appearance and cervix is closed and long   Adnexa: normal adnexa and no mass, fullness, tenderness   Bony Pelvis: gynecoid  System: Breast:  normal appearance, no masses or tenderness   Skin: normal coloration and turgor, no rashes    Neurologic: oriented, no focal deficits   Extremities: normal strength, tone, and muscle mass   HEENT extra ocular movement intact   Mouth/Teeth mucous membranes moist, pharynx normal without lesions and dental hygiene poor   Neck supple and no masses   Cardiovascular: regular rate and rhythm   Respiratory:  chest clear, no wheezing, crepitations, rhonchi, normal symmetric air entry   Abdomen: soft, non-tender;  bowel sounds normal; no masses,  no organomegaly   Urinary:       Assessment:    Pregnancy: [id number] Patient Active Problem List   Diagnosis Date Noted  . Supervision of normal pregnancy, antepartum 06/06/2016  . Chlamydia infection complicating pregnancy in first trimester 05/18/2016        Plan:     Initial labs drawn. Prenatal vitamins. Problem list reviewed and updated. Genetic Screening discussed Quad Screen: ordered.  Ultrasound discussed; fetal survey: ordered. TOC done today Flu vaccine today  Follow up in 4 weeks. 50% of 30 min visit spent on counseling and coordination of care.     Kristen Santos 06/08/2016

## 2016-06-10 LAB — URINE CULTURE, OB REFLEX

## 2016-06-10 LAB — GC/CHLAMYDIA PROBE AMP
Chlamydia trachomatis, NAA: NEGATIVE
NEISSERIA GONORRHOEAE BY PCR: NEGATIVE

## 2016-06-10 LAB — CULTURE, OB URINE

## 2016-06-11 LAB — PAP IG W/ RFLX HPV ASCU: PAP Smear Comment: 0

## 2016-06-15 LAB — PRENATAL PROFILE I(LABCORP)
ANTIBODY SCREEN: NEGATIVE
BASOS ABS: 0 10*3/uL (ref 0.0–0.2)
Basos: 0 %
EOS (ABSOLUTE): 0.1 10*3/uL (ref 0.0–0.4)
EOS: 1 %
HEP B S AG: NEGATIVE
Hematocrit: 36.7 % (ref 34.0–46.6)
Hemoglobin: 12.6 g/dL (ref 11.1–15.9)
IMMATURE GRANULOCYTES: 0 %
Immature Grans (Abs): 0 10*3/uL (ref 0.0–0.1)
Lymphocytes Absolute: 2.8 10*3/uL (ref 0.7–3.1)
Lymphs: 47 %
MCH: 31.8 pg (ref 26.6–33.0)
MCHC: 34.3 g/dL (ref 31.5–35.7)
MCV: 93 fL (ref 79–97)
MONOCYTES: 7 %
Monocytes Absolute: 0.4 10*3/uL (ref 0.1–0.9)
NEUTROS PCT: 45 %
Neutrophils Absolute: 2.7 10*3/uL (ref 1.4–7.0)
PLATELETS: 315 10*3/uL (ref 150–379)
RBC: 3.96 x10E6/uL (ref 3.77–5.28)
RDW: 14.1 % (ref 12.3–15.4)
RH TYPE: POSITIVE
RPR Ser Ql: NONREACTIVE
RUBELLA: 6.36 {index} (ref >0.99)
WBC: 6.1 10*3/uL (ref 3.4–10.8)

## 2016-06-15 LAB — AFP, QUAD SCREEN
DIA Value (EIA): 220.24 pg/mL
DSR (BY AGE) 1 IN: 749
Gestational Age: 14.4 WEEKS
MSAFP: 28.5 ng/mL
MSHCG: 64427 m[IU]/mL
Maternal Age At EDD: 29.4 YEARS
WEIGHT: 194 [lb_av]
uE3 Value: 0.55 ng/mL

## 2016-06-15 LAB — VARICELLA ZOSTER ANTIBODY, IGG: VARICELLA: 1534 {index} (ref >165)

## 2016-06-15 LAB — TOXASSURE SELECT 13 (MW), URINE

## 2016-06-15 LAB — HIV ANTIBODY (ROUTINE TESTING W REFLEX): HIV Screen 4th Generation wRfx: NONREACTIVE

## 2016-06-24 ENCOUNTER — Emergency Department (HOSPITAL_COMMUNITY)
Admission: EM | Admit: 2016-06-24 | Discharge: 2016-06-24 | Disposition: A | Discharge status: Home / Self Care | Payer: Medicaid Other | Attending: Emergency Medicine | Admitting: Emergency Medicine

## 2016-06-24 ENCOUNTER — Encounter (HOSPITAL_COMMUNITY): Payer: Self-pay

## 2016-06-24 DIAGNOSIS — J069 Acute upper respiratory infection, unspecified: Secondary | ICD-10-CM | POA: Diagnosis not present

## 2016-06-24 DIAGNOSIS — R0981 Nasal congestion: Secondary | ICD-10-CM | POA: Diagnosis present

## 2016-06-24 HISTORY — DX: Encounter for supervision of normal pregnancy, unspecified, unspecified trimester: Z34.90

## 2016-06-24 MED ORDER — FLUTICASONE PROPIONATE 50 MCG/ACT NA SUSP: 2.0000 | Freq: Every day | NASAL | 2 refills | Status: DC

## 2016-06-24 NOTE — ED Triage Notes (Signed)
Patient here with 1 day of sneezing, runny nose, congestion and fatigue with frontal headache, NAD, general body aches with same

## 2016-06-24 NOTE — ED Notes (Signed)
Pt comfortable with discharge and follow up instructions. Pt declines wheelchair, escorted to waiting area by this RN. Rx x1 

## 2016-06-24 NOTE — Discharge Instructions (Signed)
Tylenol only for pain / fever Flonase in the morning Benadryl may help with congestion and cough  See your doctor if symtpoms worsen

## 2016-06-24 NOTE — ED Provider Notes (Signed)
[facility]-EMERGENCY DEPT Provider Note   CSN: [id number] Arrival date & time: 06/24/16  1157   By signing my name below, I, Freida Busmaniana Omoyeni, attest that this documentation has been prepared under the direction and in the presence of Eber HongBrian Labrittany Wechter, MD . Electronically Signed: Freida Busmaniana Omoyeni, Scribe. 06/24/2016. 12:32 PM.   History   Chief Complaint Chief Complaint  Patient presents with  . Fever  . Nasal Congestion    The history is provided by the patient. No language interpreter was used.     HPI Comments:  Kristen Santos is a 30 y.o. female who presents to the Emergency Department complaining of fatigue and sneezing since last night. She reports associated HA today, rhinorrhea, sore throat and mild cough. She denies ear pain. No alleviating factors noted. Pt  reports recent sick contacts at home. Pt also notes she is currently ~ [redacted] weeks pregnant.     Past Medical History:  Diagnosis Date  . Chlamydia   . Ileus (HCC)   . Pregnant   . UTI (lower urinary tract infection)     Patient Active Problem List   Diagnosis Date Noted  . Supervision of normal pregnancy, antepartum 06/06/2016  . Chlamydia infection complicating pregnancy in first trimester 05/18/2016    Past Surgical History:  Procedure Laterality Date  . ABDOMINAL SURGERY      OB History    Gravida Para Term Preterm AB Living   4 1 1   2 1    SAB TAB Ectopic Multiple Live Births   2               Home Medications    Prior to Admission medications   Medication Sig Start Date End Date Taking? Authorizing Provider  cephALEXin (KEFLEX) 500 MG capsule Take 1 capsule (500 mg total) by mouth 4 (four) times daily. Patient not taking: Reported on 05/18/2016 05/12/16   Duane LopeJennifer I Rasch, NP  cephALEXin (KEFLEX) 500 MG capsule Take 1 capsule (500 mg total) by mouth 4 (four) times daily. 05/18/16   Jean RosenthalSusan P Lineberry, NP  fluticasone (FLONASE) 50 MCG/ACT nasal spray Place 2 sprays into both nostrils daily. 06/24/16   Eber HongBrian  Asanti Craigo, MD  metroNIDAZOLE (FLAGYL) 500 MG tablet Take 1 tablet (500 mg total) by mouth 2 (two) times daily. Patient not taking: Reported on 05/18/2016 05/12/16   Duane LopeJennifer I Rasch, NP  Multiple Vitamin (MULTIVITAMIN WITH MINERALS) TABS tablet Take 1 tablet by mouth daily.    Historical Provider, MD  phenazopyridine (PYRIDIUM) 200 MG tablet Take 1 tablet (200 mg total) by mouth 3 (three) times daily. 05/18/16   Jean RosenthalSusan P Lineberry, NP    Family History Family History  Problem Relation Age of Onset  . Hypertension Mother   . Kidney disease Father     Social History Social History  Substance Use Topics  . Smoking status: Never Smoker  . Smokeless tobacco: Never Used  . Alcohol use No     Allergies   Review of patient's allergies indicates no known allergies.   Review of Systems Review of Systems  Constitutional: Positive for fatigue.  HENT: Positive for rhinorrhea, sneezing and sore throat. Negative for ear pain.   Respiratory: Positive for cough.   Neurological: Positive for headaches.     Physical Exam Updated Vital Signs BP 109/67   Pulse 82   Temp 98.5 F (36.9 C)   Resp 18   LMP 02/28/2016   SpO2 99%   Physical Exam  Constitutional: She appears well-developed and well-nourished.  HENT:  Head: Normocephalic and atraumatic.  Mouth/Throat: Oropharynx is clear and moist and mucous membranes are normal. No oropharyngeal exudate. No tonsillar exudate.  Eyes: Conjunctivae are normal. Right eye exhibits no discharge. Left eye exhibits no discharge.  Pulmonary/Chest: Effort normal. No respiratory distress.  Lymphadenopathy:    She has no cervical adenopathy.  Neurological: She is alert. Coordination normal.  Skin: Skin is warm and dry. No rash noted. She is not diaphoretic. No erythema.  Psychiatric: She has a normal mood and affect.  Nursing note and vitals reviewed.    ED Treatments / Results  DIAGNOSTIC STUDIES:  Oxygen Saturation is 99% on RA, normal by my  interpretation.    COORDINATION OF CARE:  12:17 PM Discussed treatment plan with pt at bedside and pt agreed to plan.   Labs (all labs ordered are listed, but only abnormal results are displayed) Labs Reviewed - No data to display   Radiology No results found.  Procedures Procedures (including critical care time)  Medications Ordered in ED Medications - No data to display   Initial Impression / Assessment and Plan / ED Course  I have reviewed the triage vital signs and the nursing notes.  Pertinent labs & imaging results that were available during my care of the patient were reviewed by me and considered in my medical decision making (see chart for details).  Clinical Course    Well appaering, viral process,  Patient with symptoms consistent with URI.  Pt will be discharged with symptomatic treatment. Pt counseled on medication use in pregnancy.  Discussed return precautions.  Pt is hemodynamically stable & in NAD prior to discharge.    Final Clinical Impressions(s) / ED Diagnoses   Final diagnoses:  Viral upper respiratory tract infection    New Prescriptions Discharge Medication List as of 06/24/2016 12:19 PM    START taking these medications   Details  fluticasone (FLONASE) 50 MCG/ACT nasal spray Place 2 sprays into both nostrils daily., Starting Thu 06/24/2016, Print       I personally performed the services described in this documentation, which was scribed in my presence. The recorded information has been reviewed and is accurate.       Eber Hong, MD 06/24/16 1247

## 2016-07-06 ENCOUNTER — Ambulatory Visit (INDEPENDENT_AMBULATORY_CARE_PROVIDER_SITE_OTHER): Payer: Medicaid Other

## 2016-07-06 ENCOUNTER — Ambulatory Visit (INDEPENDENT_AMBULATORY_CARE_PROVIDER_SITE_OTHER): Payer: Medicaid Other | Admitting: Obstetrics and Gynecology

## 2016-07-06 VITALS — BP 113/77 | HR 71 | Wt 205.0 lb

## 2016-07-06 DIAGNOSIS — Z3492 Encounter for supervision of normal pregnancy, unspecified, second trimester: Secondary | ICD-10-CM

## 2016-07-06 DIAGNOSIS — Z3490 Encounter for supervision of normal pregnancy, unspecified, unspecified trimester: Secondary | ICD-10-CM

## 2016-07-06 DIAGNOSIS — Z3480 Encounter for supervision of other normal pregnancy, unspecified trimester: Secondary | ICD-10-CM

## 2016-07-06 DIAGNOSIS — Z3482 Encounter for supervision of other normal pregnancy, second trimester: Secondary | ICD-10-CM

## 2016-07-06 NOTE — Progress Notes (Signed)
   PRENATAL VISIT NOTE  Subjective:  Kristen Santos is a 30 y.o. G4P1021 at 2955w3d being seen today for ongoing prenatal care.  She is currently monitored for the following issues for this low-risk pregnancy and has Chlamydia infection complicating pregnancy in first trimester and Supervision of normal pregnancy, antepartum on her problem list.  Patient reports no complaints.  Contractions: Not present. Vag. Bleeding: None.  Movement: Present. Denies leaking of fluid.   The following portions of the patient's history were reviewed and updated as appropriate: allergies, current medications, past family history, past medical history, past social history, past surgical history and problem list. Problem list updated.  Objective:   Vitals:   07/06/16 1108  BP: 113/77  Pulse: 71  Weight: 205 lb (93 kg)    Fetal Status: Fetal Heart Rate (bpm): 145   Movement: Present     General:  Alert, oriented and cooperative. Patient is in no acute distress.  Skin: Skin is warm and dry. No rash noted.   Cardiovascular: Normal heart rate noted  Respiratory: Normal respiratory effort, no problems with respiration noted  Abdomen: Soft, gravid, appropriate for gestational age. Pain/Pressure: Absent     Pelvic:  Cervical exam deferred        Extremities: Normal range of motion.  Edema: None  Mental Status: Normal mood and affect. Normal behavior. Normal judgment and thought content.   Assessment and Plan:  Pregnancy: G4P1021 at 2955w3d  1. Supervision of other normal pregnancy, antepartum Patient is doing well without complaints Quad screen today Ultrasound performed today- report to be reviewed when available Patient has an upcoming dental appointment  General obstetric precautions including but not limited to vaginal bleeding, contractions, leaking of fluid and fetal movement were reviewed in detail with the patient. Please refer to After Visit Summary for other counseling recommendations.  Return  in about 4 weeks (around 08/03/2016).  Catalina AntiguaPeggy Rabiah Goeser, MD

## 2016-07-08 LAB — AFP, QUAD SCREEN
DIA Mom Value: 0.85
DIA Value (EIA): 125.46 pg/mL
DSR (BY AGE) 1 IN: 749
DSR (SECOND TRIMESTER) 1 IN: 10000
Gestational Age: 18.4 WEEKS
MSAFP MOM: 1.02
MSAFP: 42 ng/mL
MSHCG Mom: 1.32
MSHCG: 28350 m[IU]/mL
Maternal Age At EDD: 29.4 YEARS
OSB RISK: 10000
Test Results:: NEGATIVE
UE3 VALUE: 1.66 ng/mL
WEIGHT: 205 [lb_av]
uE3 Mom: 1.25

## 2016-08-03 ENCOUNTER — Other Ambulatory Visit (HOSPITAL_COMMUNITY)
Admission: RE | Admit: 2016-08-03 | Discharge: 2016-08-03 | Disposition: A | Discharge status: Home / Self Care | Payer: Medicaid Other | Source: Ambulatory Visit | Attending: Obstetrics and Gynecology | Admitting: Obstetrics and Gynecology

## 2016-08-03 ENCOUNTER — Ambulatory Visit (INDEPENDENT_AMBULATORY_CARE_PROVIDER_SITE_OTHER): Payer: Medicaid Other | Admitting: Obstetrics and Gynecology

## 2016-08-03 VITALS — BP 116/74 | HR 86 | Temp 97.1°F | Wt 206.4 lb

## 2016-08-03 DIAGNOSIS — Z113 Encounter for screening for infections with a predominantly sexual mode of transmission: Secondary | ICD-10-CM | POA: Insufficient documentation

## 2016-08-03 DIAGNOSIS — A749 Chlamydial infection, unspecified: Secondary | ICD-10-CM

## 2016-08-03 DIAGNOSIS — Z3480 Encounter for supervision of other normal pregnancy, unspecified trimester: Secondary | ICD-10-CM

## 2016-08-03 DIAGNOSIS — O98811 Other maternal infectious and parasitic diseases complicating pregnancy, first trimester: Principal | ICD-10-CM

## 2016-08-03 DIAGNOSIS — O98311 Other infections with a predominantly sexual mode of transmission complicating pregnancy, first trimester: Secondary | ICD-10-CM

## 2016-08-03 NOTE — Progress Notes (Signed)
Subjective:  Benna Dunksriniece Karman is a 30 y.o. G4P1021 at 1058w3d being seen today for ongoing prenatal care.  She is currently monitored for the following issues for this low-risk pregnancy and has Chlamydia infection complicating pregnancy in first trimester and Supervision of normal pregnancy, antepartum on her problem list.  Patient reports no complaints.  Contractions: Not present. Vag. Bleeding: None.  Movement: Present. Denies leaking of fluid.   The following portions of the patient's history were reviewed and updated as appropriate: allergies, current medications, past family history, past medical history, past social history, past surgical history and problem list. Problem list updated.  Objective:   Vitals:   08/03/16 1016  BP: 116/74  Pulse: 86  Temp: 97.1 F (36.2 C)  Weight: 206 lb 6.4 oz (93.6 kg)    Fetal Status: Fetal Heart Rate (bpm): 136   Movement: Present     General:  Alert, oriented and cooperative. Patient is in no acute distress.  Skin: Skin is warm and dry. No rash noted.   Cardiovascular: Normal heart rate noted  Respiratory: Normal respiratory effort, no problems with respiration noted  Abdomen: Soft, gravid, appropriate for gestational age. Pain/Pressure: Absent     Pelvic:  Cervical exam deferred        Extremities: Normal range of motion.  Edema: None  Mental Status: Normal mood and affect. Normal behavior. Normal judgment and thought content.   Urinalysis:      Assessment and Plan:  Pregnancy: G4P1021 at 358w3d  1. Supervision of other normal pregnancy, antepartum   2. Chlamydia infection complicating pregnancy in first trimester  - GC/Chlamydia Probe Amp  Preterm labor symptoms and general obstetric precautions including but not limited to vaginal bleeding, contractions, leaking of fluid and fetal movement were reviewed in detail with the patient. Please refer to After Visit Summary for other counseling recommendations.  No Follow-up on  file.   Hermina StaggersMichael L Diamond Martucci, MD

## 2016-08-03 NOTE — Progress Notes (Signed)
Patient states that she feels good today. 

## 2016-08-03 NOTE — Addendum Note (Signed)
Addended by: Natale MilchSTALLING, Elissia Spiewak D on: 08/03/2016 11:01 AM   Modules accepted: Orders

## 2016-08-04 LAB — GC/CHLAMYDIA PROBE AMP (~~LOC~~) NOT AT ARMC
Chlamydia: NEGATIVE
Neisseria Gonorrhea: NEGATIVE

## 2016-08-16 ENCOUNTER — Encounter: Payer: Self-pay | Admitting: *Deleted

## 2016-08-31 ENCOUNTER — Other Ambulatory Visit: Payer: Medicaid Other

## 2016-08-31 ENCOUNTER — Ambulatory Visit (INDEPENDENT_AMBULATORY_CARE_PROVIDER_SITE_OTHER): Payer: Medicaid Other | Admitting: Obstetrics & Gynecology

## 2016-08-31 VITALS — BP 107/71 | HR 90 | Wt 213.0 lb

## 2016-08-31 DIAGNOSIS — Z23 Encounter for immunization: Secondary | ICD-10-CM

## 2016-08-31 DIAGNOSIS — Z3480 Encounter for supervision of other normal pregnancy, unspecified trimester: Secondary | ICD-10-CM

## 2016-08-31 DIAGNOSIS — Z3482 Encounter for supervision of other normal pregnancy, second trimester: Secondary | ICD-10-CM

## 2016-08-31 NOTE — Progress Notes (Signed)
   PRENATAL VISIT NOTE  Subjective:  Kristen Santos is a 30 y.o. G4P1021 at 7380w3d being seen today for ongoing prenatal care.  She is currently monitored for the following issues for this low-risk pregnancy and has Chlamydia infection complicating pregnancy in first trimester and Supervision of normal pregnancy, antepartum on her problem list.  Patient reports no complaints.  Contractions: Not present. Vag. Bleeding: None.  Movement: Present. Denies leaking of fluid.   The following portions of the patient's history were reviewed and updated as appropriate: allergies, current medications, past family history, past medical history, past social history, past surgical history and problem list. Problem list updated.  Objective:   Vitals:   08/31/16 0921  BP: 107/71  Pulse: 90  Weight: 213 lb (96.6 kg)    Fetal Status:     Movement: Present     General:  Alert, oriented and cooperative. Patient is in no acute distress.  Skin: Skin is warm and dry. No rash noted.   Cardiovascular: Normal heart rate noted  Respiratory: Normal respiratory effort, no problems with respiration noted  Abdomen: Soft, gravid, appropriate for gestational age. Pain/Pressure: Absent     Pelvic:  Cervical exam deferred        Extremities: Normal range of motion.     Mental Status: Normal mood and affect. Normal behavior. Normal judgment and thought content.   Assessment and Plan:  Pregnancy: G4P1021 at 480w3d  1. Supervision of other normal pregnancy, antepartum - 28 week labs, tdap  Preterm labor symptoms and general obstetric precautions including but not limited to vaginal bleeding, contractions, leaking of fluid and fetal movement were reviewed in detail with the patient. Please refer to After Visit Summary for other counseling recommendations.  Return in about 3 weeks (around 09/21/2016).   Allie BossierMyra C Verleen Stuckey, MD

## 2016-09-01 LAB — GLUCOSE TOLERANCE, 2 HOURS W/ 1HR
GLUCOSE, 1 HOUR: 116 mg/dL (ref 65–179)
Glucose, 2 hour: 70 mg/dL (ref 65–152)
Glucose, Fasting: 82 mg/dL (ref 65–91)

## 2016-09-01 LAB — CBC
HEMATOCRIT: 38.6 % (ref 34.0–46.6)
HEMOGLOBIN: 12.7 g/dL (ref 11.1–15.9)
MCH: 31.8 pg (ref 26.6–33.0)
MCHC: 32.9 g/dL (ref 31.5–35.7)
MCV: 97 fL (ref 79–97)
Platelets: 301 10*3/uL (ref 150–379)
RBC: 3.99 x10E6/uL (ref 3.77–5.28)
RDW: 14.4 % (ref 12.3–15.4)
WBC: 8 10*3/uL (ref 3.4–10.8)

## 2016-09-01 LAB — HIV ANTIBODY (ROUTINE TESTING W REFLEX): HIV SCREEN 4TH GENERATION: NONREACTIVE

## 2016-09-01 LAB — RPR: RPR: NONREACTIVE

## 2016-09-20 NOTE — L&D Delivery Note (Addendum)
Delivery Note At 9:03 PM a viable female was delivered via Vaginal, Spontaneous Delivery (Presentation:OA).  APGAR: 6, 8, 8; weight 7'14 . Placenta status: delivered intact with 3VC.  Cord:  with the following complications: none.  Cord pH: n/a  Anesthesia: epidural, local Episiotomy: None Lacerations: bilateral labial, periurethral; right repaired, others superficial and hemostatic Suture Repair: 3.0 vicryl rapide Est. Blood Loss (mL): 100  Mom to postpartum.  Baby to Couplet care / Skin to Skin.  Donette LarryMelanie Ocie Tino, CNM 12/11/2016, 9:22 PM

## 2016-09-21 ENCOUNTER — Encounter: Payer: Self-pay | Admitting: *Deleted

## 2016-09-21 ENCOUNTER — Ambulatory Visit (INDEPENDENT_AMBULATORY_CARE_PROVIDER_SITE_OTHER): Payer: Medicaid Other | Admitting: Certified Nurse Midwife

## 2016-09-21 VITALS — BP 101/66 | HR 105 | Temp 98.8°F | Wt 222.0 lb

## 2016-09-21 DIAGNOSIS — J020 Streptococcal pharyngitis: Secondary | ICD-10-CM | POA: Diagnosis not present

## 2016-09-21 DIAGNOSIS — Z3490 Encounter for supervision of normal pregnancy, unspecified, unspecified trimester: Secondary | ICD-10-CM

## 2016-09-21 DIAGNOSIS — O98311 Other infections with a predominantly sexual mode of transmission complicating pregnancy, first trimester: Secondary | ICD-10-CM

## 2016-09-21 DIAGNOSIS — O98811 Other maternal infectious and parasitic diseases complicating pregnancy, first trimester: Principal | ICD-10-CM

## 2016-09-21 DIAGNOSIS — A749 Chlamydial infection, unspecified: Secondary | ICD-10-CM

## 2016-09-21 LAB — POCT RAPID STREP A (OFFICE): RAPID STREP A SCREEN: POSITIVE — AB

## 2016-09-21 MED ORDER — AMOXICILLIN-POT CLAVULANATE 875-125 MG PO TABS: 1.0000 | ORAL_TABLET | Freq: Two times a day (BID) | ORAL | 0 refills | Status: DC

## 2016-09-21 NOTE — Progress Notes (Signed)
Subjective:    Kristen Santos is a 31 y.o. female being seen today for her obstetrical visit. She is at 559w3d gestation. Patient reports no bleeding, no contractions, no cramping, no leaking and uri symptoms. Fetal movement: normal.  [place] [employer]  URI symptoms, denies fever, reports nasal congestion, cough, no sputum production   Problem List Items Addressed This Visit      Other   Chlamydia infection complicating pregnancy in first trimester - Primary   Supervision of normal pregnancy, antepartum   Relevant Orders   Tdap vaccine greater than or equal to 7yo IM    Other Visit Diagnoses    Strep pharyngitis       Relevant Medications   amoxicillin-clavulanate (AUGMENTIN) 875-125 MG tablet   Other Relevant Orders   POCT rapid strep A (Completed)   Influenza a and b     Patient Active Problem List   Diagnosis Date Noted  . Supervision of normal pregnancy, antepartum 06/06/2016  . Chlamydia infection complicating pregnancy in first trimester 05/18/2016   Objective:    BP 101/66   Pulse (!) 105   Temp 98.8 F (37.1 C)   Wt 222 lb (100.7 kg)   LMP 02/28/2016   BMI 35.83 kg/m  FHT:  142 BPM  Uterine Size: 30 cm and size equals dates  Presentation: cephalic   + strep test  Assessment:    Pregnancy @ 6059w3d weeks   URI acute  + strep pharyngitis  Plan:     labs reviewed, problem list updated  GBS planning TDAP offered/planning on a future date d/t illness   Pediatrician: discussed. Infant feeding: plans to breastfeed. Maternity leave: discussed. Cigarette smoking: never smoked. Orders Placed This Encounter  Procedures  . Influenza a and b    Call STAT lab results to [name], CNM at [phone]  . Tdap vaccine greater than or equal to 7yo IM  . POCT rapid strep A   Meds ordered this encounter  Medications  . amoxicillin-clavulanate (AUGMENTIN) 875-125 MG tablet    Sig: Take 1 tablet by mouth 2 (two) times daily.    Dispense:   14 tablet    Refill:  0   Follow up in 2 Weeks.

## 2016-09-21 NOTE — Addendum Note (Signed)
Addended by: Samantha CrimesENNEY, [place] Rollyson ANNE on: 09/21/2016 05:23 PM   Modules accepted: Orders

## 2016-09-21 NOTE — Progress Notes (Signed)
Pt c/o coughing up yellow mucous x 1 week.

## 2016-09-22 ENCOUNTER — Other Ambulatory Visit: Payer: Self-pay | Admitting: Obstetrics and Gynecology

## 2016-09-22 DIAGNOSIS — Z3483 Encounter for supervision of other normal pregnancy, third trimester: Secondary | ICD-10-CM

## 2016-09-22 DIAGNOSIS — Z3490 Encounter for supervision of normal pregnancy, unspecified, unspecified trimester: Secondary | ICD-10-CM

## 2016-09-22 LAB — INFLUENZA A AND B
INFLUENZA B AG, EIA: NEGATIVE
Influenza A Ag, EIA: NEGATIVE

## 2016-09-22 LAB — PLEASE NOTE:

## 2016-09-28 ENCOUNTER — Ambulatory Visit (HOSPITAL_COMMUNITY)
Admission: RE | Admit: 2016-09-28 | Discharge: 2016-09-28 | Disposition: A | Discharge status: Home / Self Care | Payer: Medicaid Other | Source: Ambulatory Visit | Attending: Obstetrics and Gynecology | Admitting: Obstetrics and Gynecology

## 2016-09-28 ENCOUNTER — Other Ambulatory Visit: Payer: Self-pay | Admitting: Obstetrics and Gynecology

## 2016-09-28 DIAGNOSIS — Z3483 Encounter for supervision of other normal pregnancy, third trimester: Secondary | ICD-10-CM

## 2016-09-28 DIAGNOSIS — Z3A30 30 weeks gestation of pregnancy: Secondary | ICD-10-CM

## 2016-09-28 DIAGNOSIS — Z363 Encounter for antenatal screening for malformations: Secondary | ICD-10-CM | POA: Diagnosis present

## 2016-09-28 DIAGNOSIS — Z3689 Encounter for other specified antenatal screening: Secondary | ICD-10-CM

## 2016-10-06 ENCOUNTER — Encounter: Payer: Medicaid Other | Admitting: Certified Nurse Midwife

## 2016-10-08 ENCOUNTER — Ambulatory Visit (INDEPENDENT_AMBULATORY_CARE_PROVIDER_SITE_OTHER): Payer: Medicaid Other | Admitting: Certified Nurse Midwife

## 2016-10-08 VITALS — BP 100/67 | HR 99 | Temp 98.1°F | Wt 221.2 lb

## 2016-10-08 DIAGNOSIS — Z3480 Encounter for supervision of other normal pregnancy, unspecified trimester: Secondary | ICD-10-CM

## 2016-10-08 NOTE — Progress Notes (Signed)
   PRENATAL VISIT NOTE  Subjective:  Benna Dunksriniece Mcvay is a 31 y.o. G4P1021 at 3476w6d being seen today for ongoing prenatal care.  She is currently monitored for the following issues for this low-risk pregnancy and has Chlamydia infection complicating pregnancy in first trimester on her problem list.  Patient reports no complaints.  Contractions: Not present. Vag. Bleeding: None.  Movement: Present. Denies leaking of fluid.   The following portions of the patient's history were reviewed and updated as appropriate: allergies, current medications, past family history, past medical history, past social history, past surgical history and problem list. Problem list updated.  Objective:   Vitals:   10/08/16 0958  BP: 100/67  Pulse: 99  Temp: 98.1 F (36.7 C)  Weight: 221 lb 3.2 oz (100.3 kg)    Fetal Status: Fetal Heart Rate (bpm): 150 Fundal Height: 30 cm Movement: Present     General:  Alert, oriented and cooperative. Patient is in no acute distress.  Skin: Skin is warm and dry. No rash noted.   Cardiovascular: Normal heart rate noted  Respiratory: Normal respiratory effort, no problems with respiration noted  Abdomen: Soft, gravid, appropriate for gestational age. Pain/Pressure: Present     Pelvic:  Cervical exam deferred        Extremities: Normal range of motion.  Edema: None  Mental Status: Normal mood and affect. Normal behavior. Normal judgment and thought content.   Assessment and Plan:  Pregnancy: G4P1021 at 3976w6d  1. Supervision of other normal pregnancy, antepartum    Doing well  Preterm labor symptoms and general obstetric precautions including but not limited to vaginal bleeding, contractions, leaking of fluid and fetal movement were reviewed in detail with the patient. Please refer to After Visit Summary for other counseling recommendations.  Return in about 2 weeks (around 10/22/2016) for ROB.   Roe Coombsachelle A Montray Kliebert, CNM

## 2016-10-08 NOTE — Patient Instructions (Addendum)
Third Trimester of Pregnancy The third trimester is from week 29 through week 40 (months 7 through 9). The third trimester is a time when the unborn baby (fetus) is growing rapidly. At the end of the ninth month, the fetus is about 20 inches in length and weighs 6-10 pounds. Body changes during your third trimester Your body goes through many changes during pregnancy. The changes vary from woman to woman. During the third trimester:  Your weight will continue to increase. You can expect to gain 25-35 pounds (11-16 kg) by the end of the pregnancy.  You may begin to get stretch marks on your hips, abdomen, and breasts.  You may urinate more often because the fetus is moving lower into your pelvis and pressing on your bladder.  You may develop or continue to have heartburn. This is caused by increased hormones that slow down muscles in the digestive tract.  You may develop or continue to have constipation because increased hormones slow digestion and cause the muscles that push waste through your intestines to relax.  You may develop hemorrhoids. These are swollen veins (varicose veins) in the rectum that can itch or be painful.  You may develop swollen, bulging veins (varicose veins) in your legs.  You may have increased body aches in the pelvis, back, or thighs. This is due to weight gain and increased hormones that are relaxing your joints.  You may have changes in your hair. These can include thickening of your hair, rapid growth, and changes in texture. Some women also have hair loss during or after pregnancy, or hair that feels dry or thin. Your hair will most likely return to normal after your baby is born.  Your breasts will continue to grow and they will continue to become tender. A yellow fluid (colostrum) may leak from your breasts. This is the first milk you are producing for your baby.  Your belly button may stick out.  You may notice more swelling in your hands, face, or  ankles.  You may have increased tingling or numbness in your hands, arms, and legs. The skin on your belly may also feel numb.  You may feel short of breath because of your expanding uterus.  You may have more problems sleeping. This can be caused by the size of your belly, increased need to urinate, and an increase in your body's metabolism.  You may notice the fetus "dropping," or moving lower in your abdomen.  You may have increased vaginal discharge.  Your cervix becomes thin and soft (effaced) near your due date. What to expect at prenatal visits You will have prenatal exams every 2 weeks until week 36. Then you will have weekly prenatal exams. During a routine prenatal visit:  You will be weighed to make sure you and the fetus are growing normally.  Your blood pressure will be taken.  Your abdomen will be measured to track your baby's growth.  The fetal heartbeat will be listened to.  Any test results from the previous visit will be discussed.  You may have a cervical check near your due date to see if you have effaced. At around 36 weeks, your health care provider will check your cervix. At the same time, your health care provider will also perform a test on the secretions of the vaginal tissue. This test is to determine if a type of bacteria, Group B streptococcus, is present. Your health care provider will explain this further. Your health care provider may ask you:    What your birth plan is.  How you are feeling.  If you are feeling the baby move.  If you have had any abnormal symptoms, such as leaking fluid, bleeding, severe headaches, or abdominal cramping.  If you are using any tobacco products, including cigarettes, chewing tobacco, and electronic cigarettes.  If you have any questions. Other tests or screenings that may be performed during your third trimester include:  Blood tests that check for low iron levels (anemia).  Fetal testing to check the health,  activity level, and growth of the fetus. Testing is done if you have certain medical conditions or if there are problems during the pregnancy.  Nonstress test (NST). This test checks the health of your baby to make sure there are no signs of problems, such as the baby not getting enough oxygen. During this test, a belt is placed around your belly. The baby is made to move, and its heart rate is monitored during movement. What is false labor? False labor is a condition in which you feel small, irregular tightenings of the muscles in the womb (contractions) that eventually go away. These are called Braxton Hicks contractions. Contractions may last for hours, days, or even weeks before true labor sets in. If contractions come at regular intervals, become more frequent, increase in intensity, or become painful, you should see your health care provider. What are the signs of labor?  Abdominal cramps.  Regular contractions that start at 10 minutes apart and become stronger and more frequent with time.  Contractions that start on the top of the uterus and spread down to the lower abdomen and back.  Increased pelvic pressure and dull back pain.  A watery or bloody mucus discharge that comes from the vagina.  Leaking of amniotic fluid. This is also known as your "water breaking." It could be a slow trickle or a gush. Let your doctor know if it has a color or strange odor. If you have any of these signs, call your health care provider right away, even if it is before your due date. Follow these instructions at home: Eating and drinking  Continue to eat regular, healthy meals.  Do not eat:  Raw meat or meat spreads.  Unpasteurized milk or cheese.  Unpasteurized juice.  Store-made salad.  Refrigerated smoked seafood.  Hot dogs or deli meat, unless they are piping hot.  More than 6 ounces of albacore tuna a week.  Shark, swordfish, king mackerel, or tile fish.  Store-made salads.  Raw  sprouts, such as mung bean or alfalfa sprouts.  Take prenatal vitamins as told by your health care provider.  Take 1000 mg of calcium daily as told by your health care provider.  If you develop constipation:  Take over-the-counter or prescription medicines.  Drink enough fluid to keep your urine clear or pale yellow.  Eat foods that are high in fiber, such as fresh fruits and vegetables, whole grains, and beans.  Limit foods that are high in fat and processed sugars, such as fried and sweet foods. Activity  Exercise only as directed by your health care provider. Healthy pregnant women should aim for 2 hours and 30 minutes of moderate exercise per week. If you experience any pain or discomfort while exercising, stop.  Avoid heavy lifting.  Do not exercise in extreme heat or humidity, or at high altitudes.  Wear low-heel, comfortable shoes.  Practice good posture.  Do not travel far distances unless it is absolutely necessary and only with the approval   of your health care provider.  Wear your seat belt at all times while in a car, on a bus, or on a plane.  Take frequent breaks and rest with your legs elevated if you have leg cramps or low back pain.  Do not use hot tubs, steam rooms, or saunas.  You may continue to have sex unless your health care provider tells you otherwise. Lifestyle  Do not use any products that contain nicotine or tobacco, such as cigarettes and e-cigarettes. If you need help quitting, ask your health care provider.  Do not drink alcohol.  Do not use any medicinal herbs or unprescribed drugs. These chemicals affect the formation and growth of the baby.  If you develop varicose veins:  Wear support pantyhose or compression stockings as told by your healthcare provider.  Elevate your feet for 15 minutes, 3-4 times a day.  Wear a supportive maternity bra to help with breast tenderness. General instructions  Take over-the-counter and prescription  medicines only as told by your health care provider. There are medicines that are either safe or unsafe to take during pregnancy.  Take warm sitz baths to soothe any pain or discomfort caused by hemorrhoids. Use hemorrhoid cream or witch hazel if your health care provider approves.  Avoid cat litter boxes and soil used by cats. These carry germs that can cause birth defects in the baby. If you have a cat, ask someone to clean the litter box for you.  To prepare for the arrival of your baby:  Take prenatal classes to understand, practice, and ask questions about the labor and delivery.  Make a trial run to the hospital.  Visit the hospital and tour the maternity area.  Arrange for maternity or paternity leave through employers.  Arrange for family and friends to take care of pets while you are in the hospital.  Purchase a rear-facing car seat and make sure you know how to install it in your car.  Pack your hospital bag.  Prepare the baby's nursery. Make sure to remove all pillows and stuffed animals from the baby's crib to prevent suffocation.  Visit your dentist if you have not gone during your pregnancy. Use a soft toothbrush to brush your teeth and be gentle when you floss.  Keep all prenatal follow-up visits as told by your health care provider. This is important. Contact a health care provider if:  You are unsure if you are in labor or if your water has broken.  You become dizzy.  You have mild pelvic cramps, pelvic pressure, or nagging pain in your abdominal area.  You have lower back pain.  You have persistent nausea, vomiting, or diarrhea.  You have an unusual or bad smelling vaginal discharge.  You have pain when you urinate. Get help right away if:  You have a fever.  You are leaking fluid from your vagina.  You have spotting or bleeding from your vagina.  You have severe abdominal pain or cramping.  You have rapid weight loss or weight gain.  You have  shortness of breath with chest pain.  You notice sudden or extreme swelling of your face, hands, ankles, feet, or legs.  Your baby makes fewer than 10 movements in 2 hours.  You have severe headaches that do not go away with medicine.  You have vision changes. Summary  The third trimester is from week 29 through week 40, months 7 through 9. The third trimester is a time when the unborn baby (fetus)   is growing rapidly.  During the third trimester, your discomfort may increase as you and your baby continue to gain weight. You may have abdominal, leg, and back pain, sleeping problems, and an increased need to urinate.  During the third trimester your breasts will keep growing and they will continue to become tender. A yellow fluid (colostrum) may leak from your breasts. This is the first milk you are producing for your baby.  False labor is a condition in which you feel small, irregular tightenings of the muscles in the womb (contractions) that eventually go away. These are called Braxton Hicks contractions. Contractions may last for hours, days, or even weeks before true labor sets in.  Signs of labor can include: abdominal cramps; regular contractions that start at 10 minutes apart and become stronger and more frequent with time; watery or bloody mucus discharge that comes from the vagina; increased pelvic pressure and dull back pain; and leaking of amniotic fluid. This information is not intended to replace advice given to you by your health care provider. Make sure you discuss any questions you have with your health care provider. Document Released: 08/31/2001 Document Revised: 02/12/2016 Document Reviewed: 11/07/2012 [employer] Interactive Patient Education  2017 [employer]  

## 2016-10-12 ENCOUNTER — Encounter (HOSPITAL_COMMUNITY): Payer: Self-pay

## 2016-10-12 ENCOUNTER — Inpatient Hospital Stay (HOSPITAL_COMMUNITY)
Admission: AD | Admit: 2016-10-12 | Discharge: 2016-10-12 | Disposition: A | Discharge status: Home / Self Care | Payer: Medicaid Other | Source: Ambulatory Visit | Attending: Family Medicine | Admitting: Family Medicine

## 2016-10-12 DIAGNOSIS — R05 Cough: Secondary | ICD-10-CM | POA: Insufficient documentation

## 2016-10-12 DIAGNOSIS — O26893 Other specified pregnancy related conditions, third trimester: Secondary | ICD-10-CM | POA: Diagnosis not present

## 2016-10-12 DIAGNOSIS — J111 Influenza due to unidentified influenza virus with other respiratory manifestations: Secondary | ICD-10-CM | POA: Insufficient documentation

## 2016-10-12 DIAGNOSIS — Z3A32 32 weeks gestation of pregnancy: Secondary | ICD-10-CM

## 2016-10-12 DIAGNOSIS — J101 Influenza due to other identified influenza virus with other respiratory manifestations: Secondary | ICD-10-CM | POA: Diagnosis not present

## 2016-10-12 LAB — URINALYSIS, ROUTINE W REFLEX MICROSCOPIC
BILIRUBIN URINE: NEGATIVE
BILIRUBIN URINE: NEGATIVE
Bacteria, UA: NONE SEEN
Glucose, UA: NEGATIVE mg/dL
Glucose, UA: NEGATIVE mg/dL
HGB URINE DIPSTICK: NEGATIVE
Hgb urine dipstick: NEGATIVE
KETONES UR: NEGATIVE mg/dL
Ketones, ur: 5 mg/dL — AB
NITRITE: NEGATIVE
Nitrite: NEGATIVE
PH: 6 (ref 5.0–8.0)
Protein, ur: 30 mg/dL — AB
Protein, ur: 30 mg/dL — AB
SPECIFIC GRAVITY, URINE: 1.028 (ref 1.005–1.030)
SPECIFIC GRAVITY, URINE: 1.03 (ref 1.005–1.030)
pH: 6 (ref 5.0–8.0)

## 2016-10-12 LAB — CBC WITH DIFFERENTIAL/PLATELET
BASOS ABS: 0 10*3/uL (ref 0.0–0.1)
BASOS PCT: 0 %
EOS PCT: 1 %
Eosinophils Absolute: 0 10*3/uL (ref 0.0–0.7)
HCT: 33.1 % — ABNORMAL LOW (ref 36.0–46.0)
Hemoglobin: 11.8 g/dL — ABNORMAL LOW (ref 12.0–15.0)
Lymphocytes Relative: 25 %
Lymphs Abs: 1.6 10*3/uL (ref 0.7–4.0)
MCH: 31.7 pg (ref 26.0–34.0)
MCHC: 35.6 g/dL (ref 30.0–36.0)
MCV: 89 fL (ref 78.0–100.0)
MONO ABS: 0.4 10*3/uL (ref 0.1–1.0)
Monocytes Relative: 6 %
NEUTROS ABS: 4.4 10*3/uL (ref 1.7–7.7)
Neutrophils Relative %: 68 %
PLATELETS: 248 10*3/uL (ref 150–400)
RBC: 3.72 MIL/uL — ABNORMAL LOW (ref 3.87–5.11)
RDW: 13.3 % (ref 11.5–15.5)
WBC: 6.4 10*3/uL (ref 4.0–10.5)

## 2016-10-12 LAB — INFLUENZA PANEL BY PCR (TYPE A & B)
INFLBPCR: NEGATIVE
Influenza A By PCR: POSITIVE — AB

## 2016-10-12 MED ORDER — OSELTAMIVIR PHOSPHATE 75 MG PO CAPS
75.0000 mg | ORAL_CAPSULE | Freq: Once | ORAL | Status: AC
  Administered 2016-10-12: 75 mg via ORAL
  Filled 2016-10-12: qty 1

## 2016-10-12 MED ORDER — OSELTAMIVIR PHOSPHATE 75 MG PO CAPS: 75.0000 mg | ORAL_CAPSULE | Freq: Two times a day (BID) | ORAL | 0 refills | Status: AC

## 2016-10-12 MED ORDER — ACETAMINOPHEN 500 MG PO TABS
1000.0000 mg | ORAL_TABLET | Freq: Once | ORAL | Status: AC
  Administered 2016-10-12: 1000 mg via ORAL
  Filled 2016-10-12: qty 2

## 2016-10-12 NOTE — MAU Provider Note (Signed)
Chief Complaint:  Flu symptoms   First Provider Initiated Contact with Patient 10/12/16 2032      HPI: Kristen Dunksriniece Hepner is a 31 y.o. G4P1021 at 6679w3d who presents to maternity admissions reporting flu symptoms.  Started about 2 days ago, as a dry cough. Then progressed into deep cough, slightly productive, fevers (today only- did not feel like it, she found out she had one in MAU), nasal congestion and rhinorrhea, denies sore throat. +body aches. Denies N/V/D. No other sick contacts.  Able to tolerate PO diet, decreased appetite today. Drinking a lot of fluids. No urinary symptoms. Patient did received the flu vaccine 05/2016. No urinary symptoms.  Denies contractions, leakage of fluid or vaginal bleeding. Good fetal movement.     Past Medical History: Past Medical History:  Diagnosis Date  . Chlamydia   . Ileus (HCC)   . Pregnant   . UTI (lower urinary tract infection)     Past obstetric history: OB History  Gravida Para Term Preterm AB Living  4 1 1   2 1   SAB TAB Ectopic Multiple Live Births  2            # Outcome Date GA Lbr Len/2nd Weight Sex Delivery Anes PTL Lv  4 Current           3 SAB           2 SAB           1 Term      Vag-Spont         Past Surgical History: Past Surgical History:  Procedure Laterality Date  . ABDOMINAL SURGERY       Family History: Family History  Problem Relation Age of Onset  . Hypertension Mother   . Kidney disease Father     Social History: Social History  Substance Use Topics  . Smoking status: Never Smoker  . Smokeless tobacco: Never Used  . Alcohol use No    Allergies:  Allergies  Allergen Reactions  . Penicillins Itching    Has patient had a PCN reaction causing immediate rash, facial/tongue/throat swelling, SOB or lightheadedness with hypotension: yes Has patient had a PCN reaction causing severe rash involving mucus membranes or skin necrosis: Yes Has patient had a PCN reaction that required hospitalization  No Has patient had a PCN reaction occurring within the last 10 years: Yes If all of the above answers are "NO", then may proceed with Cephalosporin use. **pt had itching/tingling in throat**    Meds:  Prescriptions Prior to Admission  Medication Sig Dispense Refill Last Dose  . Prenatal Vit-Fe Fumarate-FA (PRENATAL MULTIVITAMIN) TABS tablet Take 1 tablet by mouth daily at 12 noon.   10/11/2016 at Unknown time  . amoxicillin-clavulanate (AUGMENTIN) 875-125 MG tablet Take 1 tablet by mouth 2 (two) times daily. (Patient not taking: Reported on 10/12/2016) 14 tablet 0 Not Taking at Unknown time    I have reviewed patient's Past Medical Hx, Surgical Hx, Family Hx, Social Hx, medications and allergies.   ROS:  A comprehensive ROS was negative except per HPI.    Physical Exam   Patient Vitals for the past 24 hrs:  BP Temp Temp src Pulse Resp SpO2 Height Weight  10/12/16 2128 - - - 105 - 96 % - -  10/12/16 2108 - - - 110 - 100 % - -  10/12/16 2032 - - - 112 - 97 % - -  10/12/16 2027 - - - 110 - 97 % - -  10/12/16 2017 - - - 112 - 96 % - -  10/12/16 2007 110/63 100.4 F (38 C) Oral 116 18 95 % - -  10/12/16 1933 112/65 101.2 F (38.4 C) Oral 116 18 97 % 5\' 6"  (1.676 m) 221 lb (100.2 kg)   Constitutional: Well-developed, well-nourished female in no acute distress.  Cardiovascular: normal rate, rhythm, no murmurs Respiratory: normal effort, CTAB, no wheezes, rhales, rhonchi. Good respiratory effort GI: Abd soft, non-tender, gravid appropriate for gestational age. Pos BS x 4 MS: Extremities nontender, no edema, normal ROM Neurologic: Alert and oriented x 4.  GU: Neg CVAT.    Labs: Results for orders placed or performed during the hospital encounter of 10/12/16 (from the past 24 hour(s))  Urinalysis, Routine w reflex microscopic     Status: Abnormal   Collection Time: 10/12/16  7:30 PM  Result Value Ref Range   Color, Urine AMBER (A) YELLOW   APPearance HAZY (A) CLEAR   Specific  Gravity, Urine 1.030 1.005 - 1.030   pH 6.0 5.0 - 8.0   Glucose, UA NEGATIVE NEGATIVE mg/dL   Hgb urine dipstick NEGATIVE NEGATIVE   Bilirubin Urine NEGATIVE NEGATIVE   Ketones, ur 5 (A) NEGATIVE mg/dL   Protein, ur 30 (A) NEGATIVE mg/dL   Nitrite NEGATIVE NEGATIVE   Leukocytes, UA MODERATE (A) NEGATIVE   RBC / HPF 0-5 0 - 5 RBC/hpf   WBC, UA 6-30 0 - 5 WBC/hpf   Bacteria, UA NONE SEEN NONE SEEN   Squamous Epithelial / LPF 6-30 (A) NONE SEEN   Mucous PRESENT   Urinalysis, Routine w reflex microscopic     Status: Abnormal   Collection Time: 10/12/16  7:39 PM  Result Value Ref Range   Color, Urine AMBER (A) YELLOW   APPearance CLOUDY (A) CLEAR   Specific Gravity, Urine 1.028 1.005 - 1.030   pH 6.0 5.0 - 8.0   Glucose, UA NEGATIVE NEGATIVE mg/dL   Hgb urine dipstick NEGATIVE NEGATIVE   Bilirubin Urine NEGATIVE NEGATIVE   Ketones, ur NEGATIVE NEGATIVE mg/dL   Protein, ur 30 (A) NEGATIVE mg/dL   Nitrite NEGATIVE NEGATIVE   Leukocytes, UA LARGE (A) NEGATIVE   RBC / HPF 6-30 0 - 5 RBC/hpf   WBC, UA TOO NUMEROUS TO COUNT 0 - 5 WBC/hpf   Bacteria, UA RARE (A) NONE SEEN   Squamous Epithelial / LPF 6-30 (A) NONE SEEN   Mucous PRESENT   Influenza panel by PCR (type A & B)     Status: Abnormal   Collection Time: 10/12/16  8:42 PM  Result Value Ref Range   Influenza A By PCR POSITIVE (A) NEGATIVE   Influenza B By PCR NEGATIVE NEGATIVE  CBC with Differential/Platelet     Status: Abnormal   Collection Time: 10/12/16  8:55 PM  Result Value Ref Range   WBC 6.4 4.0 - 10.5 K/uL   RBC 3.72 (L) 3.87 - 5.11 MIL/uL   Hemoglobin 11.8 (L) 12.0 - 15.0 g/dL   HCT 40.9 (L) 81.1 - 91.4 %   MCV 89.0 78.0 - 100.0 fL   MCH 31.7 26.0 - 34.0 pg   MCHC 35.6 30.0 - 36.0 g/dL   RDW 78.2 95.6 - 21.3 %   Platelets 248 150 - 400 K/uL   Neutrophils Relative % 68 %   Neutro Abs 4.4 1.7 - 7.7 K/uL   Lymphocytes Relative 25 %   Lymphs Abs 1.6 0.7 - 4.0 K/uL   Monocytes Relative 6 %   Monocytes  Absolute 0.4  0.1 - 1.0 K/uL   Eosinophils Relative 1 %   Eosinophils Absolute 0.0 0.0 - 0.7 K/uL   Basophils Relative 0 %   Basophils Absolute 0.0 0.0 - 0.1 K/uL    Imaging:  [place] Mfm Ob Comp + 14 Wk  Result Date: 09/28/2016 ----------------------------------------------------------------------  OBSTETRICS REPORT                      (Signed Final 09/28/2016 05:03 pm) ---------------------------------------------------------------------- Patient Info  ID #:       [id number]                          D.O.B.:  03/31/1986 (30 yrs)  Name:       Kristen Santos              Visit Date: 09/28/2016 01:02 pm ---------------------------------------------------------------------- Performed By  Performed By:     Jodean Lima          Ref. Address:     [address]                                                             [place], [place]                                                             [address]  Attending:        Darlyn Read MD         Location:         [facility]  Referred By:      Catalina Antigua MD ---------------------------------------------------------------------- Orders   #  Description                                 Code   1  [place] MFM OB COMP + 14 WK                      76805.01  ----------------------------------------------------------------------   #  Ordered By               Order #        Accession #    Episode #   1  PEGGY CONSTANT           [id number]      [id number]     [id number]  ---------------------------------------------------------------------- Indications   [redacted] weeks gestation of pregnancy  Z3A.30   Encounter for antenatal screening for          Z36.3   malformations  ---------------------------------------------------------------------- OB History  Gravidity:    4         Term:   1        Prem:   0        SAB:   2  TOP:          0       Ectopic:  0        Living: 1  ---------------------------------------------------------------------- Fetal Evaluation  Num Of Fetuses:     1  Fetal Heart         149  Rate(bpm):  Cardiac Activity:   Observed  Presentation:       Cephalic  Placenta:           Anterior, above cervical os  P. Cord Insertion:  Visualized, central  Amniotic Fluid  AFI FV:      Subjectively within normal limits  AFI Sum(cm)     %Tile       Largest Pocket(cm)  18.47           70          6.54  RUQ(cm)       RLQ(cm)       LUQ(cm)        LLQ(cm)  6.54          4.14          5.24           2.55 ---------------------------------------------------------------------- Biometry  BPD:      78.4  mm     G. Age:  31w 3d         71  %    CI:        78.05   %    70 - 86                                                          FL/HC:      21.3   %    19.2 - 21.4  HC:      280.8  mm     G. Age:  30w 5d         24  %    HC/AC:      1.04        0.99 - 1.21  AC:      269.3  mm     G. Age:  31w 0d         63  %    FL/BPD:     76.3   %    71 - 87  FL:       59.8  mm     G. Age:  31w 1d         56  %    FL/AC:      22.2   %    20 - 24  CER:      35.8  mm     G. Age:  30w 6d         54  %  CM:          9  mm  Est. FW:    1694  gm  3 lb 12 oz      66  % ---------------------------------------------------------------------- Gestational Age  LMP:           30w 3d        Date:  02/28/16                 EDD:   12/04/16  U/S Today:     31w 1d                                        EDD:   11/29/16  Best:          30w 3d     Det. By:  LMP  (02/28/16)          EDD:   12/04/16 ---------------------------------------------------------------------- Anatomy  Cranium:               Appears normal         Aortic Arch:            Appears normal  Cavum:                 Appears normal         Ductal Arch:            Not well visualized  Ventricles:            Appears normal         Diaphragm:              Appears normal  Choroid Plexus:        Appears normal         Stomach:                Appears normal,  left                                                                        sided  Cerebellum:            Appears normal         Abdomen:                Appears normal  Posterior Fossa:       Appears normal         Abdominal Wall:         Not well visualized  Nuchal Fold:           Not applicable (>20    Cord Vessels:           Appears normal ([redacted]                         wks GA)                                        vessel cord)  Face:                  Not well visualized    Kidneys:  Appear normal  Lips:                  Appears normal         Bladder:                Appears normal  Heart:                 Not well visualized    Spine:                  Appears normal  RVOT:                  Not well visualized    Upper Extremities:      Appears normal  LVOT:                  Not well visualized    Lower Extremities:      Appears normal  Other:  Gender not well visualized. Technically difficult due to fetal position. ---------------------------------------------------------------------- Cervix Uterus Adnexa  Cervix  Not visualized (advanced GA >29wks)  Uterus  No abnormality visualized.  Left Ovary  Within normal limits.  Right Ovary  Within normal limits.  Adnexa:       No abnormality visualized. ---------------------------------------------------------------------- Impression  Single living intrauterine pregnancy at [redacted]w[redacted]d.  Appropriate fetal growth (66%).  Normal amniotic fluid volume.  The fetal anatomic survey is not complete.  No gross fetal anomalies identified. ---------------------------------------------------------------------- Recommendations  Recommend follow-up ultrasound examination in 4-6 weeks  for completion of fetal anatomic survey. ----------------------------------------------------------------------                  Darlyn Read, MD Electronically Signed Final Report   09/28/2016 05:03 pm ----------------------------------------------------------------------   MAU  Course: Influenza swab : +Influenza A CBC - no sign of infection, normal WBC UA/UCX - pending (dirty catch, but will send for culture, call patient if positive) NST - reactive  I personally reviewed the patient's NST today, found to be REACTIVE. 155 bpm, mod var, +accels, no decels. CTX: None.   MDM: Plan of care reviewed with patient, including labs and tests ordered and medical treatment. Discussed signs/symptoms to return to ED, including fevers that cannot break with Tylenol, worsening symptoms, especially productive cough, SOB, CP, lethargy, etc. Discussed roommates should be treated prophylactically. Follow up with OB provider.  Emphasized importance for PO hydration.   Assessment: 1. Influenza A     Plan: Discharge home in stable condition.  Preterm Labor precautions and fetal kick counts Tamiflu Supportive care and PO hydration important  Follow-up Information    [facility] [place]. Schedule an appointment as soon as possible for a visit in 1 week(s).   Specialty:  Obstetrics and Gynecology Why:  Follow up with [facility] provider Contact information: [address] [place] [address] [phone]          Allergies as of 10/12/2016      Reactions   Penicillins Itching   Has patient had a PCN reaction causing immediate rash, facial/tongue/throat swelling, SOB or lightheadedness with hypotension: yes Has patient had a PCN reaction causing severe rash involving mucus membranes or skin necrosis: Yes Has patient had a PCN reaction that required hospitalization No Has patient had a PCN reaction occurring within the last 10 years: Yes If all of the above answers are "NO", then may proceed with Cephalosporin use. **pt had itching/tingling in throat**      Medication List  TAKE these medications   amoxicillin-clavulanate 875-125 MG tablet Commonly known as:  AUGMENTIN Take 1 tablet by mouth 2 (two) times daily.    oseltamivir 75 MG capsule Commonly known as:  TAMIFLU Take 1 capsule (75 mg total) by mouth 2 (two) times daily.   prenatal multivitamin Tabs tablet Take 1 tablet by mouth daily at 12 noon.       Jen Mow, DO OB Fellow [facility] 10/12/2016 10:05 PM

## 2016-10-12 NOTE — Discharge Instructions (Signed)

## 2016-10-12 NOTE — MAU Note (Signed)
Pt presents with complaint of cough and body aches. Reports chills and feeling hot. Symptoms for 1 1/2 days. Also reports right upper abd pain x one week and reported this to the doctor this week.

## 2016-10-14 LAB — URINE CULTURE

## 2016-10-15 ENCOUNTER — Telehealth: Payer: Self-pay | Admitting: *Deleted

## 2016-10-15 NOTE — Telephone Encounter (Signed)
Pt called to office asking what else she could take for sinus drainage. Pt states she was diagnosed with Flu earlier this week and is on Tamiflu.  Return call to pt.  Pt made aware that she may take Mucinex, Robitussin for cough, or Tylenol Sinus. Pt advised that she may call office if she has any other problems.

## 2016-10-25 ENCOUNTER — Ambulatory Visit (INDEPENDENT_AMBULATORY_CARE_PROVIDER_SITE_OTHER): Payer: Medicaid Other | Admitting: Certified Nurse Midwife

## 2016-10-25 ENCOUNTER — Other Ambulatory Visit (HOSPITAL_COMMUNITY)
Admission: RE | Admit: 2016-10-25 | Discharge: 2016-10-25 | Disposition: A | Discharge status: Home / Self Care | Payer: Medicaid Other | Source: Ambulatory Visit | Attending: Certified Nurse Midwife | Admitting: Certified Nurse Midwife

## 2016-10-25 VITALS — BP 100/66 | HR 99 | Wt 223.0 lb

## 2016-10-25 DIAGNOSIS — Z8709 Personal history of other diseases of the respiratory system: Secondary | ICD-10-CM | POA: Insufficient documentation

## 2016-10-25 DIAGNOSIS — A749 Chlamydial infection, unspecified: Secondary | ICD-10-CM

## 2016-10-25 DIAGNOSIS — O98811 Other maternal infectious and parasitic diseases complicating pregnancy, first trimester: Secondary | ICD-10-CM

## 2016-10-25 DIAGNOSIS — O98313 Other infections with a predominantly sexual mode of transmission complicating pregnancy, third trimester: Secondary | ICD-10-CM

## 2016-10-25 DIAGNOSIS — Z3480 Encounter for supervision of other normal pregnancy, unspecified trimester: Secondary | ICD-10-CM

## 2016-10-25 DIAGNOSIS — Z113 Encounter for screening for infections with a predominantly sexual mode of transmission: Secondary | ICD-10-CM | POA: Diagnosis not present

## 2016-10-25 DIAGNOSIS — H10023 Other mucopurulent conjunctivitis, bilateral: Secondary | ICD-10-CM

## 2016-10-25 HISTORY — DX: Personal history of other diseases of the respiratory system: Z87.09

## 2016-10-25 MED ORDER — MOXIFLOXACIN HCL 0.5 % OP SOLN: 1.0000 [drp] | Freq: Three times a day (TID) | OPHTHALMIC | 0 refills | Status: DC

## 2016-10-25 NOTE — Progress Notes (Signed)
   PRENATAL VISIT NOTE  Subjective:  Kristen Santos is a 31 y.o. G4P1021 at 2190w2d being seen today for ongoing prenatal care.  She is currently monitored for the following issues for this low-risk pregnancy and has Chlamydia infection complicating pregnancy in first trimester; Supervision of normal pregnancy, antepartum; and H/O influenza on her problem list.  Patient reports no bleeding, no contractions, no cramping, no leaking and uri and pink eye symptoms.  Contractions: Not present.  .  Movement: Present. Denies leaking of fluid.   The following portions of the patient's history were reviewed and updated as appropriate: allergies, current medications, past family history, past medical history, past social history, past surgical history and problem list. Problem list updated.  Objective:   Vitals:   10/25/16 1416  BP: 100/66  Pulse: 99  Weight: 223 lb (101.2 kg)    Fetal Status: Fetal Heart Rate (bpm): 145 Fundal Height: 35 cm Movement: Present     General:  Alert, oriented and cooperative. Patient is in no acute distress.  Skin: Skin is warm and dry. No rash noted.   Cardiovascular: Normal heart rate noted  Respiratory: Normal respiratory effort, no problems with respiration noted  Abdomen: Soft, gravid, appropriate for gestational age. Pain/Pressure: Present     Pelvic:  Cervical exam deferred        Extremities: Normal range of motion.     Mental Status: Normal mood and affect. Normal behavior. Normal judgment and thought content.   Assessment and Plan:  Pregnancy: G4P1021 at 3590w2d  1. Chlamydia infection complicating pregnancy in first trimester     TOC next rob with GBS. TOC today off of urine culture.   2. Supervision of other normal pregnancy, antepartum     GBS next ROB  3. H/O influenza     URI tx OTC mucinex, robitussin DM, sudafed discussed.  If continues consider antibiotic treatment.   4. Pink eye disease of both eyes  - moxifloxacin (VIGAMOX) 0.5 %  ophthalmic solution; Place 1 drop into both eyes 3 (three) times daily.  Dispense: 3 mL; Refill: 0  Preterm labor symptoms and general obstetric precautions including but not limited to vaginal bleeding, contractions, leaking of fluid and fetal movement were reviewed in detail with the patient. Please refer to After Visit Summary for other counseling recommendations.  Return in about 1 week (around 11/01/2016) for ROB, GBS.   Roe Coombsachelle A Charlesa Ehle, CNM

## 2016-10-25 NOTE — Progress Notes (Signed)
Pt states that she has been recently treated for Flu.  Pt states that she now has sinus drainage/ congestion.  Pt states she has also has some drainage from eyes and eyes have been pink. Pt states that she has been taking Mucinex with some relief.

## 2016-10-26 ENCOUNTER — Other Ambulatory Visit: Payer: Self-pay | Admitting: Certified Nurse Midwife

## 2016-10-26 DIAGNOSIS — A568 Sexually transmitted chlamydial infection of other sites: Secondary | ICD-10-CM

## 2016-10-26 DIAGNOSIS — O98313 Other infections with a predominantly sexual mode of transmission complicating pregnancy, third trimester: Principal | ICD-10-CM

## 2016-10-26 LAB — CERVICOVAGINAL ANCILLARY ONLY
CHLAMYDIA, DNA PROBE: POSITIVE — AB
Neisseria Gonorrhea: NEGATIVE

## 2016-10-26 MED ORDER — AZITHROMYCIN 250 MG PO TABS: ORAL_TABLET | ORAL | 0 refills | Status: DC

## 2016-11-08 ENCOUNTER — Ambulatory Visit (INDEPENDENT_AMBULATORY_CARE_PROVIDER_SITE_OTHER): Payer: Medicaid Other | Admitting: Certified Nurse Midwife

## 2016-11-08 VITALS — BP 103/71 | HR 96 | Wt 227.0 lb

## 2016-11-08 DIAGNOSIS — A568 Sexually transmitted chlamydial infection of other sites: Secondary | ICD-10-CM

## 2016-11-08 DIAGNOSIS — A749 Chlamydial infection, unspecified: Secondary | ICD-10-CM

## 2016-11-08 DIAGNOSIS — O98313 Other infections with a predominantly sexual mode of transmission complicating pregnancy, third trimester: Secondary | ICD-10-CM

## 2016-11-08 DIAGNOSIS — Z3483 Encounter for supervision of other normal pregnancy, third trimester: Secondary | ICD-10-CM

## 2016-11-08 DIAGNOSIS — Z8709 Personal history of other diseases of the respiratory system: Secondary | ICD-10-CM

## 2016-11-08 DIAGNOSIS — Z3480 Encounter for supervision of other normal pregnancy, unspecified trimester: Secondary | ICD-10-CM

## 2016-11-08 NOTE — Progress Notes (Signed)
   PRENATAL VISIT NOTE  Subjective:  Kristen Santos a 31 y.o. G4P1021 at 4174w2d being seen today for ongoing prenatal care.  She is currently monitored for the following issues for this low-risk pregnancy and has Chlamydia trachomatis infection in mother during third trimester of pregnancy; Supervision of normal pregnancy, antepartum; and H/O influenza on her problem list.  Patient reports no complaints.  Contractions: Not present. Vag. Bleeding: None.  Movement: Present. Denies leaking of fluid.   The following portions of the patient's history were reviewed and updated as appropriate: allergies, current medications, past family history, past medical history, past social history, past surgical history and problem list. Problem list updated.  Objective:   Vitals:   11/08/16 0901  BP: 103/71  Pulse: 96  Weight: 227 lb (103 kg)    Fetal Status: Fetal Heart Rate (bpm): 140 Fundal Height: 36 cm Movement: Present  Presentation: Vertex  General:  Alert, oriented and cooperative. Patient is in no acute distress.  Skin: Skin is warm and dry. No rash noted.   Cardiovascular: Normal heart rate noted  Respiratory: Normal respiratory effort, no problems with respiration noted  Abdomen: Soft, gravid, appropriate for gestational age. Pain/Pressure: Present     Pelvic:  Cervical exam performed Dilation: Closed Effacement (%): 0 Station: Ballotable  Extremities: Normal range of motion.     Mental Status: Normal mood and affect. Normal behavior. Normal judgment and thought content.   Assessment and Plan:  Pregnancy: G4P1021 at 1274w2d  1. Supervision of other normal pregnancy, antepartum       - US MFM OB FOLLOW UP; Future - Culture, beta strep (group b only)  2. H/O influenza     Treated. No current symptoms  3. Chlamydia trachomatis infection in mother during third trimester of pregnancy     Treated, FOB treated, Last sexual intercourse last week.    Preterm labor symptoms and  general obstetric precautions including but not limited to vaginal bleeding, contractions, leaking of fluid and fetal movement were reviewed in detail with the patient. Please refer to After Visit Summary for other counseling recommendations.  Return in about 1 week (around 11/15/2016) for ROB.   Roe Coombsachelle A Hedda Crumbley, CNM

## 2016-11-11 ENCOUNTER — Other Ambulatory Visit: Payer: Self-pay | Admitting: Certified Nurse Midwife

## 2016-11-11 ENCOUNTER — Ambulatory Visit (HOSPITAL_COMMUNITY)
Admission: RE | Admit: 2016-11-11 | Discharge: 2016-11-11 | Disposition: A | Discharge status: Home / Self Care | Payer: Medicaid Other | Source: Ambulatory Visit | Attending: Certified Nurse Midwife | Admitting: Certified Nurse Midwife

## 2016-11-11 DIAGNOSIS — Z362 Encounter for other antenatal screening follow-up: Secondary | ICD-10-CM | POA: Diagnosis present

## 2016-11-11 DIAGNOSIS — Z0489 Encounter for examination and observation for other specified reasons: Secondary | ICD-10-CM

## 2016-11-11 DIAGNOSIS — IMO0002 Reserved for concepts with insufficient information to code with codable children: Secondary | ICD-10-CM

## 2016-11-11 DIAGNOSIS — Z3480 Encounter for supervision of other normal pregnancy, unspecified trimester: Secondary | ICD-10-CM

## 2016-11-11 DIAGNOSIS — Z3A36 36 weeks gestation of pregnancy: Secondary | ICD-10-CM | POA: Insufficient documentation

## 2016-11-12 ENCOUNTER — Other Ambulatory Visit: Payer: Self-pay | Admitting: Certified Nurse Midwife

## 2016-11-12 DIAGNOSIS — Z3480 Encounter for supervision of other normal pregnancy, unspecified trimester: Secondary | ICD-10-CM

## 2016-11-12 LAB — CULTURE, BETA STREP (GROUP B ONLY): STREP GP B CULTURE: NEGATIVE

## 2016-11-15 ENCOUNTER — Ambulatory Visit (INDEPENDENT_AMBULATORY_CARE_PROVIDER_SITE_OTHER): Payer: Medicaid Other | Admitting: Certified Nurse Midwife

## 2016-11-15 VITALS — BP 109/73 | HR 90 | Wt 228.0 lb

## 2016-11-15 DIAGNOSIS — A568 Sexually transmitted chlamydial infection of other sites: Secondary | ICD-10-CM

## 2016-11-15 DIAGNOSIS — O98313 Other infections with a predominantly sexual mode of transmission complicating pregnancy, third trimester: Secondary | ICD-10-CM

## 2016-11-15 DIAGNOSIS — Z3480 Encounter for supervision of other normal pregnancy, unspecified trimester: Secondary | ICD-10-CM

## 2016-11-15 DIAGNOSIS — A749 Chlamydial infection, unspecified: Secondary | ICD-10-CM

## 2016-11-15 NOTE — Progress Notes (Signed)
Pt would like cervical exam today. 

## 2016-11-15 NOTE — Progress Notes (Signed)
   PRENATAL VISIT NOTE  Subjective:  Kristen Santos is a 31 y.o. G4P1021 at 7434w2d being seen today for ongoing prenatal care.  She is currently monitored for the following issues for this low-risk pregnancy and has Chlamydia trachomatis infection in mother during third trimester of pregnancy; Supervision of normal pregnancy, antepartum; and H/O influenza on her problem list.  Patient reports no complaints.  Contractions: Not present. Vag. Bleeding: None.  Movement: Present. Denies leaking of fluid.   The following portions of the patient's history were reviewed and updated as appropriate: allergies, current medications, past family history, past medical history, past social history, past surgical history and problem list. Problem list updated.  Objective:   Vitals:   11/15/16 1038  BP: 109/73  Pulse: 90  Weight: 228 lb (103.4 kg)    Fetal Status: Fetal Heart Rate (bpm): 135 Fundal Height: 37 cm Movement: Present     General:  Alert, oriented and cooperative. Patient is in no acute distress.  Skin: Skin is warm and dry. No rash noted.   Cardiovascular: Normal heart rate noted  Respiratory: Normal respiratory effort, no problems with respiration noted  Abdomen: Soft, gravid, appropriate for gestational age. Pain/Pressure: Present     Pelvic:  Cervical exam deferred        Extremities: Normal range of motion.     Mental Status: Normal mood and affect. Normal behavior. Normal judgment and thought content.   Assessment and Plan:  Pregnancy: G4P1021 at 7234w2d  1. Supervision of other normal pregnancy, antepartum     Doing well  2. Chlamydia trachomatis infection in mother during third trimester of pregnancy      TOC with next ROB  Term labor symptoms and general obstetric precautions including but not limited to vaginal bleeding, contractions, leaking of fluid and fetal movement were reviewed in detail with the patient. Please refer to After Visit Summary for other counseling  recommendations.  Return in about 1 week (around 11/22/2016) for ROB.   Roe Coombsachelle A Desire Fulp, CNM

## 2016-11-22 ENCOUNTER — Other Ambulatory Visit (HOSPITAL_COMMUNITY)
Admission: RE | Admit: 2016-11-22 | Discharge: 2016-11-22 | Disposition: A | Discharge status: Home / Self Care | Payer: Medicaid Other | Source: Ambulatory Visit | Attending: Certified Nurse Midwife | Admitting: Certified Nurse Midwife

## 2016-11-22 ENCOUNTER — Ambulatory Visit (INDEPENDENT_AMBULATORY_CARE_PROVIDER_SITE_OTHER): Payer: Medicaid Other | Admitting: Certified Nurse Midwife

## 2016-11-22 VITALS — BP 132/85 | HR 93 | Wt 232.0 lb

## 2016-11-22 DIAGNOSIS — Z3480 Encounter for supervision of other normal pregnancy, unspecified trimester: Secondary | ICD-10-CM

## 2016-11-22 DIAGNOSIS — A749 Chlamydial infection, unspecified: Secondary | ICD-10-CM

## 2016-11-22 DIAGNOSIS — Z113 Encounter for screening for infections with a predominantly sexual mode of transmission: Secondary | ICD-10-CM | POA: Diagnosis not present

## 2016-11-22 DIAGNOSIS — A568 Sexually transmitted chlamydial infection of other sites: Secondary | ICD-10-CM

## 2016-11-22 DIAGNOSIS — O98313 Other infections with a predominantly sexual mode of transmission complicating pregnancy, third trimester: Secondary | ICD-10-CM

## 2016-11-22 DIAGNOSIS — Z8709 Personal history of other diseases of the respiratory system: Secondary | ICD-10-CM

## 2016-11-22 NOTE — Progress Notes (Signed)
Patient is doing well

## 2016-11-22 NOTE — Progress Notes (Signed)
   PRENATAL VISIT NOTE  Subjective:  Kristen Santos is a 31 y.o. G4P1021 at 4027w2d being seen today for ongoing prenatal care.  She is currently monitored for the following issues for this low-risk pregnancy and has Chlamydia trachomatis infection in mother during third trimester of pregnancy; Supervision of normal pregnancy, antepartum; and H/O influenza on her problem list.  Patient reports no complaints.  Contractions: Not present. Vag. Bleeding: None.  Movement: Present. Denies leaking of fluid.   The following portions of the patient's history were reviewed and updated as appropriate: allergies, current medications, past family history, past medical history, past social history, past surgical history and problem list. Problem list updated.  Objective:   Vitals:   11/22/16 1033  BP: 132/85  Pulse: 93  Weight: 232 lb (105.2 kg)    Fetal Status: Fetal Heart Rate (bpm): 147 Fundal Height: 38 cm Movement: Present  Presentation: Vertex  General:  Alert, oriented and cooperative. Patient is in no acute distress.  Skin: Skin is warm and dry. No rash noted.   Cardiovascular: Normal heart rate noted  Respiratory: Normal respiratory effort, no problems with respiration noted  Abdomen: Soft, gravid, appropriate for gestational age. Pain/Pressure: Present     Pelvic:  Cervical exam performed Dilation: 2 Effacement (%): 0 Station: -3  Extremities: Normal range of motion.  Edema: None  Mental Status: Normal mood and affect. Normal behavior. Normal judgment and thought content.   Assessment and Plan:  Pregnancy: G4P1021 at 7627w2d  1. Screening examination for STD (sexually transmitted disease)      - Cervicovaginal ancillary only  2. Supervision of other normal pregnancy, antepartum     Doing well  3. H/O influenza      Over the flu  4. Chlamydia trachomatis infection in mother during third trimester of pregnancy     TOC 11/22/16.   Term labor symptoms and general obstetric  precautions including but not limited to vaginal bleeding, contractions, leaking of fluid and fetal movement were reviewed in detail with the patient. Please refer to After Visit Summary for other counseling recommendations.  No Follow-up on file.   Roe Coombsachelle A Karo Rog, CNM

## 2016-11-23 LAB — CERVICOVAGINAL ANCILLARY ONLY
Bacterial vaginitis: NEGATIVE
Candida vaginitis: POSITIVE — AB
Chlamydia: NEGATIVE
Neisseria Gonorrhea: NEGATIVE
Trichomonas: POSITIVE — AB

## 2016-11-24 ENCOUNTER — Other Ambulatory Visit: Payer: Self-pay | Admitting: Certified Nurse Midwife

## 2016-11-24 DIAGNOSIS — O23593 Infection of other part of genital tract in pregnancy, third trimester: Secondary | ICD-10-CM

## 2016-11-24 DIAGNOSIS — B373 Candidiasis of vulva and vagina: Secondary | ICD-10-CM

## 2016-11-24 DIAGNOSIS — B3731 Acute candidiasis of vulva and vagina: Secondary | ICD-10-CM

## 2016-11-24 DIAGNOSIS — A5901 Trichomonal vulvovaginitis: Principal | ICD-10-CM

## 2016-11-24 MED ORDER — FLUCONAZOLE 150 MG PO TABS: 150.0000 mg | ORAL_TABLET | Freq: Once | ORAL | 0 refills | Status: AC

## 2016-11-24 MED ORDER — METRONIDAZOLE 500 MG PO TABS: 2000.0000 mg | ORAL_TABLET | Freq: Once | ORAL | 0 refills | Status: AC

## 2016-11-25 ENCOUNTER — Telehealth: Payer: Self-pay

## 2016-11-25 NOTE — Telephone Encounter (Signed)
Contacted pt and advised of results, and treatment plan, patient stated she would schedule TOC testing.

## 2016-11-29 ENCOUNTER — Ambulatory Visit (INDEPENDENT_AMBULATORY_CARE_PROVIDER_SITE_OTHER): Payer: Medicaid Other | Admitting: Certified Nurse Midwife

## 2016-11-29 ENCOUNTER — Encounter: Payer: Self-pay | Admitting: Certified Nurse Midwife

## 2016-11-29 VITALS — BP 126/76 | HR 97 | Temp 97.2°F | Wt 237.2 lb

## 2016-11-29 DIAGNOSIS — O98313 Other infections with a predominantly sexual mode of transmission complicating pregnancy, third trimester: Secondary | ICD-10-CM

## 2016-11-29 DIAGNOSIS — A568 Sexually transmitted chlamydial infection of other sites: Secondary | ICD-10-CM

## 2016-11-29 DIAGNOSIS — A5901 Trichomonal vulvovaginitis: Secondary | ICD-10-CM | POA: Insufficient documentation

## 2016-11-29 DIAGNOSIS — Z3480 Encounter for supervision of other normal pregnancy, unspecified trimester: Secondary | ICD-10-CM

## 2016-11-29 DIAGNOSIS — O23593 Infection of other part of genital tract in pregnancy, third trimester: Secondary | ICD-10-CM

## 2016-11-29 DIAGNOSIS — A749 Chlamydial infection, unspecified: Secondary | ICD-10-CM

## 2016-11-29 DIAGNOSIS — Z3483 Encounter for supervision of other normal pregnancy, third trimester: Secondary | ICD-10-CM

## 2016-11-29 DIAGNOSIS — Z8709 Personal history of other diseases of the respiratory system: Secondary | ICD-10-CM

## 2016-11-29 HISTORY — DX: Trichomonal vulvovaginitis: A59.01

## 2016-11-29 NOTE — Progress Notes (Signed)
   PRENATAL VISIT NOTE  Subjective:  Kristen Santos is a 31 y.o. G4P1021 at 2443w2d being seen today for ongoing prenatal care.  She is currently monitored for the following issues for this low-risk pregnancy and has Chlamydia trachomatis infection in mother during third trimester of pregnancy; Supervision of normal pregnancy, antepartum; H/O influenza; and Trichomonal vaginitis during pregnancy in third trimester on her problem list.  Patient reports no complaints.  Contractions: Not present. Vag. Bleeding: None.  Movement: Present. Denies leaking of fluid.   The following portions of the patient's history were reviewed and updated as appropriate: allergies, current medications, past family history, past medical history, past social history, past surgical history and problem list. Problem list updated.  Objective:   Vitals:   11/29/16 1037  BP: 126/76  Pulse: 97  Temp: 97.2 F (36.2 C)  Weight: 237 lb 3.2 oz (107.6 kg)    Fetal Status: Fetal Heart Rate (bpm): 151 Fundal Height: 38 cm Movement: Present     General:  Alert, oriented and cooperative. Patient is in no acute distress.  Skin: Skin is warm and dry. No rash noted.   Cardiovascular: Normal heart rate noted  Respiratory: Normal respiratory effort, no problems with respiration noted  Abdomen: Soft, gravid, appropriate for gestational age. Pain/Pressure: Present     Pelvic:  Cervical exam deferred        Extremities: Normal range of motion.  Edema: None  Mental Status: Normal mood and affect. Normal behavior. Normal judgment and thought content.   Assessment and Plan:  Pregnancy: G4P1021 at 643w2d  1. Supervision of other normal pregnancy, antepartum     Doing well  2. H/O influenza      Treated  3. Chlamydia trachomatis infection in mother during third trimester of pregnancy     [facility]  4. Trichomonal vaginitis during pregnancy in third trimester      +@38  weeks, treated.   Term labor  symptoms and general obstetric precautions including but not limited to vaginal bleeding, contractions, leaking of fluid and fetal movement were reviewed in detail with the patient. Please refer to After Visit Summary for other counseling recommendations.  Return in about 1 week (around 12/06/2016) for ROB, NST, IOL.   Roe Coombsachelle A Chere Babson, CNM

## 2016-11-29 NOTE — Progress Notes (Signed)
Patient states that she feels good today. 

## 2016-12-07 ENCOUNTER — Encounter (HOSPITAL_COMMUNITY): Payer: Self-pay | Admitting: *Deleted

## 2016-12-07 ENCOUNTER — Telehealth (HOSPITAL_COMMUNITY): Payer: Self-pay | Admitting: *Deleted

## 2016-12-07 ENCOUNTER — Ambulatory Visit (INDEPENDENT_AMBULATORY_CARE_PROVIDER_SITE_OTHER): Payer: Medicaid Other | Admitting: Certified Nurse Midwife

## 2016-12-07 VITALS — BP 119/80 | HR 89 | Wt 237.6 lb

## 2016-12-07 DIAGNOSIS — Z3480 Encounter for supervision of other normal pregnancy, unspecified trimester: Secondary | ICD-10-CM

## 2016-12-07 DIAGNOSIS — Z3483 Encounter for supervision of other normal pregnancy, third trimester: Secondary | ICD-10-CM | POA: Diagnosis not present

## 2016-12-07 NOTE — Telephone Encounter (Signed)
Preadmission screen  

## 2016-12-07 NOTE — Progress Notes (Signed)
   PRENATAL VISIT NOTE  Subjective:  Kristen Santos is a 31 y.o. G4P1021 at 4161w3d being seen today for ongoing prenatal care.  She is currently monitored for the following issues for this low-risk pregnancy and has Chlamydia trachomatis infection in mother during third trimester of pregnancy; Supervision of normal pregnancy, antepartum; H/O influenza; and Trichomonal vaginitis during pregnancy in third trimester on her problem list.  Patient reports no complaints.  Contractions: Not present. Vag. Bleeding: None.  Movement: Present. Denies leaking of fluid.   The following portions of the patient's history were reviewed and updated as appropriate: allergies, current medications, past family history, past medical history, past social history, past surgical history and problem list. Problem list updated.  Objective:   Vitals:   12/07/16 1034  BP: 119/80  Pulse: 89  Weight: 237 lb 9.6 oz (107.8 kg)    Fetal Status: Fetal Heart Rate (bpm): NST; reactive Fundal Height: 40 cm Movement: Present  Presentation: Vertex  General:  Alert, oriented and cooperative. Patient is in no acute distress.  Skin: Skin is warm and dry. No rash noted.   Cardiovascular: Normal heart rate noted  Respiratory: Normal respiratory effort, no problems with respiration noted  Abdomen: Soft, gravid, appropriate for gestational age. Pain/Pressure: Present     Pelvic:  Cervical exam performed Dilation: 1.5 Effacement (%): 0 Station: -3  Extremities: Normal range of motion.  Edema: None  Mental Status: Normal mood and affect. Normal behavior. Normal judgment and thought content.  NST: + accels, no decels, moderate variability, Cat. 1 tracing. No contractions on toco.   Assessment and Plan:  Pregnancy: G4P1021 at 3161w3d  1. Supervision of other normal pregnancy, antepartum      Reactive NST.  IOL scheduled.   - Fetal nonstress test; Future  Term labor symptoms and general obstetric precautions including but not  limited to vaginal bleeding, contractions, leaking of fluid and fetal movement were reviewed in detail with the patient. Please refer to After Visit Summary for other counseling recommendations.  Return in about 4 weeks (around 01/04/2017) for Postpartum.   Roe Coombsachelle A Denney, CNM

## 2016-12-07 NOTE — Progress Notes (Signed)
Patient denies contractions, reports pressure and good fetal movement.

## 2016-12-09 ENCOUNTER — Other Ambulatory Visit: Payer: Medicaid Other | Admitting: *Deleted

## 2016-12-09 ENCOUNTER — Other Ambulatory Visit (HOSPITAL_COMMUNITY)
Admission: RE | Admit: 2016-12-09 | Discharge: 2016-12-09 | Disposition: A | Discharge status: Home / Self Care | Payer: Medicaid Other | Source: Ambulatory Visit | Attending: Certified Nurse Midwife | Admitting: Certified Nurse Midwife

## 2016-12-09 DIAGNOSIS — Z113 Encounter for screening for infections with a predominantly sexual mode of transmission: Secondary | ICD-10-CM | POA: Insufficient documentation

## 2016-12-09 HISTORY — DX: Encounter for screening for infections with a predominantly sexual mode of transmission: Z11.3

## 2016-12-09 NOTE — Progress Notes (Signed)
Patient is 40.[redacted] weeks pregnant and wants TOC for Trich before delivery.

## 2016-12-10 LAB — CERVICOVAGINAL ANCILLARY ONLY
Chlamydia: NEGATIVE
Neisseria Gonorrhea: NEGATIVE
Trichomonas: POSITIVE — AB

## 2016-12-11 ENCOUNTER — Inpatient Hospital Stay (HOSPITAL_COMMUNITY)
Admission: RE | Admit: 2016-12-11 | Discharge: 2016-12-13 | DRG: 775 | Disposition: A | Discharge status: Home / Self Care | Payer: Medicaid Other | Source: Ambulatory Visit | Attending: Obstetrics and Gynecology | Admitting: Obstetrics and Gynecology

## 2016-12-11 ENCOUNTER — Inpatient Hospital Stay (HOSPITAL_COMMUNITY): Payer: Medicaid Other | Admitting: Anesthesiology

## 2016-12-11 ENCOUNTER — Encounter (HOSPITAL_COMMUNITY): Payer: Self-pay

## 2016-12-11 DIAGNOSIS — O480 Post-term pregnancy: Principal | ICD-10-CM | POA: Diagnosis present

## 2016-12-11 DIAGNOSIS — Z8249 Family history of ischemic heart disease and other diseases of the circulatory system: Secondary | ICD-10-CM

## 2016-12-11 DIAGNOSIS — Z880 Allergy status to penicillin: Secondary | ICD-10-CM | POA: Diagnosis not present

## 2016-12-11 DIAGNOSIS — Z3A41 41 weeks gestation of pregnancy: Secondary | ICD-10-CM | POA: Diagnosis not present

## 2016-12-11 DIAGNOSIS — Z3480 Encounter for supervision of other normal pregnancy, unspecified trimester: Secondary | ICD-10-CM

## 2016-12-11 HISTORY — DX: Post-term pregnancy: O48.0

## 2016-12-11 LAB — RPR: RPR Ser Ql: NONREACTIVE

## 2016-12-11 LAB — CBC
HCT: 34.3 % — ABNORMAL LOW (ref 36.0–46.0)
Hemoglobin: 11.6 g/dL — ABNORMAL LOW (ref 12.0–15.0)
MCH: 30.8 pg (ref 26.0–34.0)
MCHC: 33.8 g/dL (ref 30.0–36.0)
MCV: 91 fL (ref 78.0–100.0)
Platelets: 321 K/uL (ref 150–400)
RBC: 3.77 MIL/uL — ABNORMAL LOW (ref 3.87–5.11)
RDW: 14 % (ref 11.5–15.5)
WBC: 6.8 K/uL (ref 4.0–10.5)

## 2016-12-11 LAB — WET PREP, GENITAL
Clue Cells Wet Prep HPF POC: NONE SEEN
Sperm: NONE SEEN
Trich, Wet Prep: NONE SEEN
Yeast Wet Prep HPF POC: NONE SEEN

## 2016-12-11 LAB — RAPID URINE DRUG SCREEN, HOSP PERFORMED
Amphetamines: NOT DETECTED
BARBITURATES: NOT DETECTED
Benzodiazepines: NOT DETECTED
Cocaine: NOT DETECTED
Opiates: NOT DETECTED
Tetrahydrocannabinol: NOT DETECTED

## 2016-12-11 LAB — ABO/RH: ABO/RH(D): O POS

## 2016-12-11 LAB — TYPE AND SCREEN
ABO/RH(D): O POS
ANTIBODY SCREEN: NEGATIVE

## 2016-12-11 MED ORDER — ACETAMINOPHEN 325 MG PO TABS: 650.0000 mg | ORAL_TABLET | ORAL | Status: DC | PRN

## 2016-12-11 MED ORDER — SOD CITRATE-CITRIC ACID 500-334 MG/5ML PO SOLN: 30.0000 mL | ORAL | Status: DC | PRN

## 2016-12-11 MED ORDER — EPHEDRINE 5 MG/ML INJ
10.0000 mg | INTRAVENOUS | Status: DC | PRN
  Filled 2016-12-11: qty 2

## 2016-12-11 MED ORDER — DIBUCAINE 1 % RE OINT: 1.0000 "application " | TOPICAL_OINTMENT | RECTAL | Status: DC | PRN

## 2016-12-11 MED ORDER — OXYCODONE-ACETAMINOPHEN 5-325 MG PO TABS: 2.0000 | ORAL_TABLET | ORAL | Status: DC | PRN

## 2016-12-11 MED ORDER — ONDANSETRON HCL 4 MG PO TABS: 4.0000 mg | ORAL_TABLET | ORAL | Status: DC | PRN

## 2016-12-11 MED ORDER — ONDANSETRON HCL 4 MG/2ML IJ SOLN: 4.0000 mg | INTRAMUSCULAR | Status: DC | PRN

## 2016-12-11 MED ORDER — OXYCODONE-ACETAMINOPHEN 5-325 MG PO TABS: 1.0000 | ORAL_TABLET | ORAL | Status: DC | PRN

## 2016-12-11 MED ORDER — WITCH HAZEL-GLYCERIN EX PADS: 1.0000 "application " | MEDICATED_PAD | CUTANEOUS | Status: DC | PRN

## 2016-12-11 MED ORDER — PHENYLEPHRINE 40 MCG/ML (10ML) SYRINGE FOR IV PUSH (FOR BLOOD PRESSURE SUPPORT)
80.0000 ug | PREFILLED_SYRINGE | INTRAVENOUS | Status: DC | PRN
  Filled 2016-12-11: qty 5

## 2016-12-11 MED ORDER — OXYTOCIN 40 UNITS IN LACTATED RINGERS INFUSION - SIMPLE MED
2.5000 [IU]/h | INTRAVENOUS | Status: DC
  Administered 2016-12-11: 2.5 [IU]/h via INTRAVENOUS

## 2016-12-11 MED ORDER — LACTATED RINGERS IV SOLN
500.0000 mL | Freq: Once | INTRAVENOUS | Status: AC
  Administered 2016-12-11: 500 mL via INTRAVENOUS

## 2016-12-11 MED ORDER — OXYTOCIN BOLUS FROM INFUSION
500.0000 mL | Freq: Once | INTRAVENOUS | Status: AC
  Administered 2016-12-11: 500 mL via INTRAVENOUS

## 2016-12-11 MED ORDER — LIDOCAINE HCL (PF) 1 % IJ SOLN
30.0000 mL | INTRAMUSCULAR | Status: DC | PRN
  Administered 2016-12-11: 30 mL via SUBCUTANEOUS
  Filled 2016-12-11: qty 30

## 2016-12-11 MED ORDER — FLEET ENEMA 7-19 GM/118ML RE ENEM: 1.0000 | ENEMA | RECTAL | Status: DC | PRN

## 2016-12-11 MED ORDER — DIPHENHYDRAMINE HCL 25 MG PO CAPS: 25.0000 mg | ORAL_CAPSULE | Freq: Four times a day (QID) | ORAL | Status: DC | PRN

## 2016-12-11 MED ORDER — TETANUS-DIPHTH-ACELL PERTUSSIS 5-2.5-18.5 LF-MCG/0.5 IM SUSP: 0.5000 mL | Freq: Once | INTRAMUSCULAR | Status: DC

## 2016-12-11 MED ORDER — PHENYLEPHRINE 40 MCG/ML (10ML) SYRINGE FOR IV PUSH (FOR BLOOD PRESSURE SUPPORT)
80.0000 ug | PREFILLED_SYRINGE | INTRAVENOUS | Status: DC | PRN
  Filled 2016-12-11: qty 5
  Filled 2016-12-11: qty 10

## 2016-12-11 MED ORDER — PRENATAL MULTIVITAMIN CH
1.0000 | ORAL_TABLET | Freq: Every day | ORAL | Status: DC
  Administered 2016-12-12 – 2016-12-13 (×2): 1 via ORAL
  Filled 2016-12-11 (×3): qty 1

## 2016-12-11 MED ORDER — BENZOCAINE-MENTHOL 20-0.5 % EX AERO
1.0000 "application " | INHALATION_SPRAY | CUTANEOUS | Status: DC | PRN
  Administered 2016-12-12: 1 via TOPICAL
  Filled 2016-12-11 (×2): qty 56

## 2016-12-11 MED ORDER — FENTANYL CITRATE (PF) 100 MCG/2ML IJ SOLN: 100.0000 ug | INTRAMUSCULAR | Status: DC | PRN

## 2016-12-11 MED ORDER — LACTATED RINGERS IV SOLN
500.0000 mL | INTRAVENOUS | Status: DC | PRN
  Administered 2016-12-11: 500 mL via INTRAVENOUS

## 2016-12-11 MED ORDER — TERBUTALINE SULFATE 1 MG/ML IJ SOLN
0.2500 mg | Freq: Once | INTRAMUSCULAR | Status: DC | PRN
  Filled 2016-12-11: qty 1

## 2016-12-11 MED ORDER — FENTANYL 2.5 MCG/ML BUPIVACAINE 1/10 % EPIDURAL INFUSION (WH - ANES)
14.0000 mL/h | INTRAMUSCULAR | Status: DC | PRN
  Administered 2016-12-11 (×2): 14 mL/h via EPIDURAL
  Filled 2016-12-11 (×2): qty 100

## 2016-12-11 MED ORDER — DIPHENHYDRAMINE HCL 50 MG/ML IJ SOLN: 12.5000 mg | INTRAMUSCULAR | Status: DC | PRN

## 2016-12-11 MED ORDER — ONDANSETRON HCL 4 MG/2ML IJ SOLN
4.0000 mg | Freq: Four times a day (QID) | INTRAMUSCULAR | Status: DC | PRN
  Administered 2016-12-11: 4 mg via INTRAVENOUS
  Filled 2016-12-11: qty 2

## 2016-12-11 MED ORDER — LIDOCAINE HCL (PF) 1 % IJ SOLN
INTRAMUSCULAR | Status: DC | PRN
  Administered 2016-12-11: 10 mL

## 2016-12-11 MED ORDER — SENNOSIDES-DOCUSATE SODIUM 8.6-50 MG PO TABS
2.0000 | ORAL_TABLET | ORAL | Status: DC
  Administered 2016-12-12 – 2016-12-13 (×2): 2 via ORAL
  Filled 2016-12-11 (×2): qty 2

## 2016-12-11 MED ORDER — COCONUT OIL OIL: 1.0000 "application " | TOPICAL_OIL | Status: DC | PRN

## 2016-12-11 MED ORDER — SIMETHICONE 80 MG PO CHEW: 80.0000 mg | CHEWABLE_TABLET | ORAL | Status: DC | PRN

## 2016-12-11 MED ORDER — OXYTOCIN 40 UNITS IN LACTATED RINGERS INFUSION - SIMPLE MED
1.0000 m[IU]/min | INTRAVENOUS | Status: DC
  Administered 2016-12-11: 2 m[IU]/min via INTRAVENOUS
  Filled 2016-12-11: qty 1000

## 2016-12-11 MED ORDER — LACTATED RINGERS IV SOLN
INTRAVENOUS | Status: DC
  Administered 2016-12-11 (×3): via INTRAVENOUS

## 2016-12-11 MED ORDER — IBUPROFEN 600 MG PO TABS
600.0000 mg | ORAL_TABLET | Freq: Four times a day (QID) | ORAL | Status: DC
  Administered 2016-12-12 – 2016-12-13 (×7): 600 mg via ORAL
  Filled 2016-12-11 (×7): qty 1

## 2016-12-11 NOTE — Progress Notes (Signed)
LABOR PROGRESS NOTE  Subjective: Feeling pressure with contractions, getting more uncomfortable.  Objective: BP 135/81   Pulse 86   Temp 97.4 F (36.3 C) (Oral)   Resp 20   Ht 5\' 6"  (1.676 m)   Wt 238 lb (108 kg)   LMP 02/28/2016   BMI 38.41 kg/m  or   Dilation: 9 Effacement (%): 100 Station: +2 Presentation: Vertex Exam by:: Dr. Elenore Paddyhoden/ K.Bing PlumeHaynes, RN  Assessment / Plan: 31 y.o. [id number] at 1760w0d here for IOL for PD  Labor: cont pitocin (at 16).  MVUs adequate Fetal Wellbeing:  Category I; baseline 125, moderate variability, + accels, no decels Pain Control:  Epidural in place Anticipated MOD:  vaginal  Charlsie MerlesJulia Rhoden, MD 12/11/2016, 7:10 PM

## 2016-12-11 NOTE — Anesthesia Pain Management Evaluation Note (Signed)
  CRNA Pain Management Visit Note  Patient: Kristen Santos, 31 y.o., female  "Hello I am a member of the anesthesia [facility]. We have an anesthesia team available at all times to provide care throughout the hospital, including epidural management and anesthesia for C-section. I don't know your plan for the delivery whether it a natural birth, water birth, IV sedation, nitrous supplementation, doula or epidural, but we want to meet your pain goals."   1.Was your pain managed to your expectations on prior hospitalizations?   Yes   2.What is your expectation for pain management during this hospitalization?     Epidural  3.How can we help you reach that goal? Support prn  Record the patient's initial score and the patient's pain goal.   Pain: 1  Pain Goal: 2 [facility] wants you to be able to say your pain was always managed very well.  [facility] HospitalWRINKLE,Kristen Santos 12/11/2016

## 2016-12-11 NOTE — Anesthesia Procedure Notes (Signed)
Epidural Patient location during procedure: OB Start time: 12/11/2016 12:20 PM End time: 12/11/2016 12:30 PM  Staffing Anesthesiologist: Tandy Lewin  Preanesthetic Checklist Completed: patient identified, site marked, surgical consent, pre-op evaluation, timeout performed, IV checked, risks and benefits discussed and monitors and equipment checked  Epidural Patient position: sitting Prep: site prepped and draped and DuraPrep Patient monitoring: continuous pulse ox and blood pressure Approach: midline Location: L4-L5 Injection technique: LOR air  Needle:  Needle type: Tuohy  Needle gauge: 17 G Needle length: 9 cm and 9 Needle insertion depth: 9 cm Catheter type: closed end flexible Catheter size: 19 Gauge Catheter at skin depth: 15 cm Test dose: negative  Assessment Sensory level: T8 Events: blood not aspirated, injection not painful, no injection resistance, negative IV test and no paresthesia

## 2016-12-11 NOTE — Anesthesia Preprocedure Evaluation (Addendum)
Anesthesia Evaluation  Patient identified by MRN, date of birth, ID band Patient awake    Reviewed: Allergy & Precautions, H&P , NPO status , Patient's Chart, lab work & pertinent test results, reviewed documented beta blocker date and time   Airway Mallampati: II  TM Distance: >3 FB Neck ROM: full    Dental no notable dental hx.    Pulmonary neg pulmonary ROS,    Pulmonary exam normal breath sounds clear to auscultation       Cardiovascular negative cardio ROS Normal cardiovascular exam Rhythm:regular Rate:Normal     Neuro/Psych negative neurological ROS  negative psych ROS   GI/Hepatic negative GI ROS, Neg liver ROS,   Endo/Other  negative endocrine ROS  Renal/GU negative Renal ROS  negative genitourinary   Musculoskeletal   Abdominal   Peds  Hematology negative hematology ROS (+)   Anesthesia Other Findings   Reproductive/Obstetrics (+) Pregnancy                             Anesthesia Physical Anesthesia Plan  ASA: II  Anesthesia Plan: Epidural   Post-op Pain Management:    Induction:   Airway Management Planned:   Additional Equipment:   Intra-op Plan:   Post-operative Plan:   Informed Consent: I have reviewed the patients History and Physical, chart, labs and discussed the procedure including the risks, benefits and alternatives for the proposed anesthesia with the patient or authorized representative who has indicated his/her understanding and acceptance.     Plan Discussed with:   Anesthesia Plan Comments:         Anesthesia Quick Evaluation  

## 2016-12-11 NOTE — Progress Notes (Signed)
LABOR PROGRESS NOTE  Subjective: More comfortable s/p epidural placement.  Objective: BP 128/83   Pulse 84   Temp 97.6 F (36.4 C) (Oral)   Resp 18   Ht 5\' 6"  (1.676 m)   Wt 238 lb (108 kg)   LMP 02/28/2016   BMI 38.41 kg/m    Dilation: 5.5 Effacement (%): 80 Station: -2 Presentation: Vertex Exam by:: Camelia EngK. Haynes, RN  Assessment / Plan: 31 y.o. [phone] at 8172w0d here for IOL for post-dates  Labor: IOL with pitocin, s/p foley bulb, progressing Fetal Wellbeing:  Cat I - baseline 135, mod variability, +accels, no decels Pain Control:  epidural Anticipated MOD:  vagnal  Charlsie MerlesJulia Kandance Yano, MD 12/11/2016, 1:51 PM

## 2016-12-11 NOTE — Progress Notes (Signed)
Late entry due to patient care.  Delivery of infant at 2103- infant placed skin to skin with mother, little respiratory effort.  2104 infant taken to radiant warmer for further assessment. Pulse ox applied and reading 2106 O2 saturation noted in 50-60% range, [place] O2 initiated, tactile stimulation.  2107-O2 satuation noted at 60-70% range, continued blow by. Nasal flaring with respiratory effort.  2109- O2 saturation ranging 80s, continued blow by 2110- Infant O2 saturation in 90s, blow by removed and continued observation.  2111- infant maintaining O2 saturation >93%. Infant placed skin to skin with mother, pink, no signs of respiratory distress.

## 2016-12-11 NOTE — H&P (Signed)
LABOR AND DELIVERY ADMISSION HISTORY AND PHYSICAL NOTE  Kristen Santos is a 31 y.o. female [phone] with IUP at [redacted]w[redacted]d by LMP c/w 10 wk [place] presenting for PDIOL.   Patient has had 1 prior vaginal delivery, uncomplicated. She reports positive fetal movement. She denies leakage of fluid or vaginal bleeding.   Past Medical History: Past Medical History:  Diagnosis Date  . Chlamydia   . Ileus (HCC)   . Pregnant   . UTI (lower urinary tract infection)     Past Surgical History: Past Surgical History:  Procedure Laterality Date  . ABDOMINAL SURGERY      Obstetrical History: OB History    Gravida Para Term Preterm AB Living   4 1 1   2 1    SAB TAB Ectopic Multiple Live Births   2              Social History: Social History   Social History  . Marital status: Single    Spouse name: N/A  . Number of children: N/A  . Years of education: N/A   Social History Main Topics  . Smoking status: Never Smoker  . Smokeless tobacco: Never Used  . Alcohol use No  . Drug use: Yes    Types: Marijuana     Comment: last used 05/2016  . Sexual activity: Yes   Other Topics Concern  . None   Social History Narrative  . None    Family History: Family History  Problem Relation Age of Onset  . Hypertension Mother   . Kidney disease Father     Allergies: Allergies  Allergen Reactions  . Penicillins Shortness Of Breath and Itching    Has patient had a PCN reaction causing immediate rash, facial/tongue/throat swelling, SOB or lightheadedness with hypotension: yes Has patient had a PCN reaction causing severe rash involving mucus membranes or skin necrosis: Yes Has patient had a PCN reaction that required hospitalization No Has patient had a PCN reaction occurring within the last 10 years: Yes If all of the above answers are "NO", then may proceed with Cephalosporin use. **pt had itching/tingling in throat**    Prescriptions Prior to Admission  Medication Sig Dispense Refill  Last Dose  . Prenatal Vit-Fe Fumarate-FA (PRENATAL MULTIVITAMIN) TABS tablet Take 1 tablet by mouth daily at 12 noon.   Taking     Review of Systems   All systems reviewed and negative except as stated in HPI  Height 5\' 6"  (1.676 m), weight 238 lb (108 kg), last menstrual period 02/28/2016, unknown if currently breastfeeding. General appearance: alert, cooperative, appears stated age and no distress; very poor dentition Lungs: clear to auscultation bilaterally Heart: regular rate and rhythm Abdomen: soft, non-tender; bowel sounds normal Extremities: No calf swelling or tenderness Presentation: cephalic Fetal monitoring: 135 bpm, mod var, +accels, no decels Uterine activity: Infrequent/irritable Dilation: 2.5 Effacement (%): 60, 70 Station: -3 Exam by:: Dr. Omer Jack   Prenatal labs: ABO, Rh: O/Positive/-- (09/19 1452) Antibody: Negative (09/19 1452) Rubella: !Error! IMMUNE RPR: Non Reactive (12/12 1050)  HBsAg: Negative (09/19 1452)  HIV: Non Reactive (12/12 1050)  GBS:   NEG 2 hr Glucola: NEG 82/116/70 Genetic screening:  NEG Anatomy US: Normal, female  Prenatal Transfer Tool  Maternal Diabetes: No Genetic Screening: Normal Maternal Ultrasounds/Referrals: Normal Fetal Ultrasounds or other Referrals:  None Maternal Substance Abuse:  No Significant Maternal Medications:  None Significant Maternal Lab Results: Lab values include: Group B Strep negative  Results for orders placed or performed during the  hospital encounter of 12/11/16 (from the past 24 hour(s))  CBC   Collection Time: 12/11/16  8:35 AM  Result Value Ref Range   WBC 6.8 4.0 - 10.5 K/uL   RBC 3.77 (L) 3.87 - 5.11 MIL/uL   Hemoglobin 11.6 (L) 12.0 - 15.0 g/dL   HCT 40.934.3 (L) 81.136.0 - 91.446.0 %   MCV 91.0 78.0 - 100.0 fL   MCH 30.8 26.0 - 34.0 pg   MCHC 33.8 30.0 - 36.0 g/dL   RDW 78.214.0 95.611.5 - 21.315.5 %   Platelets 321 150 - 400 K/uL    Patient Active Problem List   Diagnosis Date Noted  . Post-dates pregnancy  12/11/2016  . Screening for STD (sexually transmitted disease) 12/09/2016  . Trichomonal vaginitis during pregnancy in third trimester 11/29/2016  . H/O influenza 10/25/2016  . Supervision of normal pregnancy, antepartum 06/06/2016  . Chlamydia trachomatis infection in mother during third trimester of pregnancy 05/18/2016    Assessment: Kristen Santos is a 31 y.o. G4P1021 at 6479w0d here for PDIOL  #Labor: Foley bulb, pitocin after eating #Pain:  Epidural on request, IV pain meds prn #FWB:  Cat I #ID:   GBS Neg #MOF:  Breast #MOC: Condoms #Circ:   N/A - girl  [name], DO OB Fellow [facility] 12/11/2016, 9:21 AM

## 2016-12-11 NOTE — Progress Notes (Signed)
LABOR PROGRESS NOTE  Subjective: Vaginal pressure with contractions.  Objective: BP 127/80   Pulse 82   Temp 98.2 F (36.8 C) (Oral)   Resp 18   Ht 5\' 6"  (1.676 m)   Wt 238 lb (108 kg)   LMP 02/28/2016   BMI 38.41 kg/m    Dilation: 6 Effacement (%): 80 Station: -2 Presentation: Vertex Exam by:: Dr. Elenore Paddyhoden  Assessment / Plan: 31 y.o. [id number] at 2010w0d here for IOL for post dates.  Labor: cont pitocin (at 16).  IUPC placed for monitoring MVUs.  Only change was effacement Fetal Wellbeing:  Category I; baseline 135, moderate variability, no accels, no decels Pain Control:  Epidural in place Anticipated MOD:  vaginal  Charlsie MerlesJulia Chestine Belknap, MD 12/11/2016, 5:06 PM

## 2016-12-12 NOTE — Progress Notes (Signed)
Post Partum Day 1 Subjective: no complaints, up ad lib, voiding and tolerating PO  Objective: Blood pressure 100/83, pulse 83, temperature 97.7 F (36.5 C), temperature source Oral, resp. rate 18, height 5\' 6"  (1.676 m), weight 108 kg (238 lb), last menstrual period 02/28/2016, unknown if currently breastfeeding.  Physical Exam:  General: alert, cooperative and no distress Lochia: appropriate Uterine Fundus: firm Incision: healing well, no significant drainage, no dehiscence, no significant erythema DVT Evaluation: No evidence of DVT seen on physical exam. Negative Homan's sign. No cords or calf tenderness. No significant calf/ankle edema.   Recent Labs  12/11/16 0835  HGB 11.6*  HCT 34.3*    Assessment/Plan: Plan for discharge tomorrow   LOS: 1 day   Donette LarryMelanie Treazure Nery, CNM 12/12/2016, 8:37 AM

## 2016-12-12 NOTE — Clinical Social Work Maternal (Signed)
  CLINICAL SOCIAL WORK MATERNAL/CHILD NOTE  Patient Details  Name: Kristen Santos MRN: [id number] Date of Birth: Jul 10, 1986  Date:  12/12/2016  Clinical Social Worker Initiating Note:  Ferdinand Lango Adrielle Polakowski, MSW, LCSW-A  Date/ Time Initiated:  12/12/16/1139     Child's Name:  Kristen Santos   Legal Guardian:  Other (Comment) (Not established by court system; MOB and FOB Robley Fries) parent collecteively in primary house hold composition )   Need for Interpreter:  None   Date of Referral:  12/11/16     Reason for Referral:  Other (Comment) (MOB allegedly does not have custody of 93 year old son )   Referral Source:  [facility]   Address:  [address] [place] [address]  Phone number:  [phone]   Household Members:  Self, Significant Other   Natural Supports (not living in the home):  Children, Immediate Family, Extended Family, Friends   Chiropodist: None   Employment: Unemployed   Type of Work: Unemployed    Education:  9 to 11 years   Museum/gallery curator Resources:  Medicaid   Other Resources:  [facility]   Cultural/Religious Considerations Which May Impact Care:  None reported.   Strengths:  Pediatrician chosen , Ability to meet basic needs , Home prepared for child  [facility] )   Risk Factors/Current Problems:  None   Cognitive State:  Alert , Able to Concentrate , Goal Oriented , Insightful    Mood/Affect:  Calm , Comfortable , Interested    CSW Assessment: CSW met with MOB at bedside to complete assessment for consult regarding not having custody of first child. Upon this writers arrival, MOB was warm and welcoming. This Probation officer inquired about custody of first child. MOB notes she and the FOB of her son had disagreements about who would keep the child upon them breaking up and not staying together. MOB noted FOB would take the child from her and not return him. She notes she would call law enforcement and was informed legally that  law enforcement could not do anything being that he was the father. MOB then notes she took him to court for custody and the judge ordered them to go to parenting classes. MOB notes she completed the parenting classes; however the jude granted custody to her sons grandparents being that his grandparents were providing housing, food and money to [facility] to care for him. MOB denies any CPS involvement. This Probation officer inquired about the safety of the home currently. MOB denies any safety concerns noting there is no DV hx. This Probation officer inquired if MOB has hx of depression and/or anxiety. MOB denies any BH hx. This Probation officer inquired if MOB feels she has a good support system to help in care of baby. MOB notes she does feel supported by FOB and her mom and grandma. MOB notes that FOB of this child is not FOB of her 38 year old son. This Probation officer educated MOB on PPD and safe sleeping/SIDS. MOB verbalized understanding and asked appropriate questions. This Probation officer observed MOB caring well for baby by feeding and holding. Upon completion of assessment, CSW has no further concerns regarding MOB's ability to care for baby upon d/c. Case closed to this CSW.   CSW Plan/Description:  Engineer, mining , No Further Intervention Required/No Barriers to Discharge, Information/Referral to [employer], MSW, [facility]  Office: [phone]

## 2016-12-12 NOTE — Anesthesia Postprocedure Evaluation (Signed)
Anesthesia Post Note  Patient: Kristen Santos  Procedure(s) Performed: * No procedures listed *  Patient location during evaluation: Mother Baby Anesthesia Type: Epidural Level of consciousness: awake Pain management: satisfactory to patient Vital Signs Assessment: post-procedure vital signs reviewed and stable Respiratory status: spontaneous breathing Cardiovascular status: stable Anesthetic complications: no        Last Vitals:  Vitals:   12/12/16 0018 12/12/16 0411  BP: 124/73 100/83  Pulse: 92 83  Resp: 16 18  Temp: 36.7 C 36.5 C    Last Pain:  Vitals:   12/12/16 0411  TempSrc: Oral  PainSc: 0-No pain   Pain Goal:                 KeyCorpBURGER,Callin Ashe

## 2016-12-13 ENCOUNTER — Other Ambulatory Visit: Payer: Self-pay | Admitting: Certified Nurse Midwife

## 2016-12-13 MED ORDER — IBUPROFEN 600 MG PO TABS: 600.0000 mg | ORAL_TABLET | Freq: Four times a day (QID) | ORAL | 2 refills | Status: DC

## 2016-12-13 NOTE — Discharge Summary (Signed)
OB Discharge Summary     Patient Name: Kristen Santos DOB: May 22, 1986 MRN: [id number]  Date of admission: 12/11/2016 Delivering MD: Donette Larry   Date of discharge: 12/13/2016  Admitting diagnosis: INDUCTION Intrauterine pregnancy: [redacted]w[redacted]d     Secondary diagnosis:  Active Problems:   Post-dates pregnancy  Additional problems: Trich in 3rd trimester     Discharge diagnosis: Term Pregnancy Delivered                                                                                                Post partum procedures:none  Augmentation: AROM, Pitocin and Foley Balloon  Complications: None  Hospital course:  Induction of Labor With Vaginal Delivery   31 y.o. yo [id number] at [redacted]w[redacted]d was admitted to the hospital 12/11/2016 for induction of labor.  Indication for induction: Postdates.  Patient had an uncomplicated labor course as follows: Membrane Rupture Time/Date: 2:19 PM ,12/11/2016   Intrapartum Procedures: Episiotomy: None [1]                                         Lacerations:  1st degree [2]  Patient had delivery of a Viable infant.  Information for the patient's newborn:  Kristen Santos [[id number]]  Delivery Method: Vaginal, Spontaneous Delivery (Filed from Delivery Summary)   12/11/2016  Details of delivery can be found in separate delivery note.  Patient had a routine postpartum course. Patient is discharged home 12/13/16.  Physical exam  Vitals:   12/12/16 0411 12/12/16 1229 12/12/16 1728 12/13/16 0630  BP: 100/83 117/80 124/65 110/68  Pulse: 83 87 83 85  Resp: 18 18 18 18   Temp: 97.7 F (36.5 C) 98.1 F (36.7 C) 98 F (36.7 C) 97.7 F (36.5 C)  TempSrc: Oral Oral Oral Oral  Weight:      Height:       General: alert, cooperative and no distress Lochia: appropriate Uterine Fundus: firm Incision: bilateral labial lac, healing DVT Evaluation: No evidence of DVT seen on physical exam. No cords or calf tenderness. No significant calf/ankle  edema. Labs: Lab Results  Component Value Date   WBC 6.8 12/11/2016   HGB 11.6 (L) 12/11/2016   HCT 34.3 (L) 12/11/2016   MCV 91.0 12/11/2016   PLT 321 12/11/2016   CMP Latest Ref Rng & Units 08/17/2013  Glucose 70 - 99 mg/dL 91  BUN 6 - 23 mg/dL 11  Creatinine 1.30 - 8.65 mg/dL 7.84  Sodium 696 - 295 mEq/L 137  Potassium 3.5 - 5.1 mEq/L 4.2  Chloride 96 - 112 mEq/L 100  CO2 19 - 32 mEq/L 28  Calcium 8.4 - 10.5 mg/dL 9.2    Discharge instruction: per After Visit Summary and "Baby and Me Booklet".  After visit meds:  Allergies as of 12/13/2016      Reactions   Penicillins Shortness Of Breath, Itching, Other (See Comments)   Has patient had a PCN reaction causing immediate rash, facial/tongue/throat swelling, SOB or lightheadedness with hypotension: Yes Has patient had a PCN  reaction causing severe rash involving mucus membranes or skin necrosis: No Has patient had a PCN reaction that required hospitalization No Has patient had a PCN reaction occurring within the last 10 years: Yes If all of the above answers are "NO", then may proceed with Cephalosporin use.      Medication List    TAKE these medications   ibuprofen 600 MG tablet Commonly known as:  ADVIL,MOTRIN Take 1 tablet (600 mg total) by mouth every 6 (six) hours.   prenatal multivitamin Tabs tablet Take 1 tablet by mouth daily.       Diet: routine diet  Activity: Advance as tolerated. Pelvic rest for 6 weeks.   Outpatient follow up:4 weeks Follow up Appt: Future Appointments Date Time Provider Department Center  01/10/2017 9:45 AM Kristen Santos, CNM [facility] None   Follow up Visit:No Follow-up on file.  Postpartum contraception: IUD Mirena  Newborn Data: Live born female  Birth Weight: 7 lb 14.2 oz (3578 g) APGAR: 6, 8  Baby Feeding: Breast Disposition:home with mother   12/13/2016 Kristen Santos, CNM

## 2016-12-13 NOTE — Progress Notes (Signed)
Post Partum Day #2 Subjective: no complaints, up ad lib, voiding and tolerating PO  Objective: Blood pressure 110/68, pulse 85, temperature 97.7 F (36.5 C), temperature source Oral, resp. rate 18, height 5\' 6"  (1.676 m), weight 238 lb (108 kg), last menstrual period 02/28/2016, unknown if currently breastfeeding.  Physical Exam:  General: alert, cooperative and no distress Lochia: appropriate Uterine Fundus: firm Incision: healing well, labial lac DVT Evaluation: No evidence of DVT seen on physical exam. No cords or calf tenderness. No significant calf/ankle edema.   Recent Labs  12/11/16 0835  HGB 11.6*  HCT 34.3*    Assessment/Plan: Discharge home, Breastfeeding and Contraception IUD planned   LOS: 2 days   Kristen Santos, CNM 12/13/2016, 7:44 AM

## 2016-12-13 NOTE — Plan of Care (Signed)
Problem: Nutritional: Goal: Mothers verbalization of comfort with breastfeeding process will improve Outcome: Not Applicable Date Met: 56/81/27 Patient bottle feeding. Breast care discussed along with discharge education. Patient verbalizes understanding of information.

## 2016-12-14 ENCOUNTER — Other Ambulatory Visit: Payer: Self-pay | Admitting: Certified Nurse Midwife

## 2016-12-14 ENCOUNTER — Other Ambulatory Visit: Payer: Self-pay | Admitting: *Deleted

## 2016-12-14 DIAGNOSIS — B379 Candidiasis, unspecified: Secondary | ICD-10-CM

## 2016-12-14 DIAGNOSIS — A5901 Trichomonal vulvovaginitis: Secondary | ICD-10-CM

## 2016-12-14 DIAGNOSIS — T3695XA Adverse effect of unspecified systemic antibiotic, initial encounter: Principal | ICD-10-CM

## 2016-12-14 MED ORDER — FLUCONAZOLE 150 MG PO TABS: 150.0000 mg | ORAL_TABLET | Freq: Once | ORAL | 0 refills | Status: AC

## 2016-12-14 MED ORDER — METRONIDAZOLE 500 MG PO TABS: 2000.0000 mg | ORAL_TABLET | Freq: Once | ORAL | 0 refills | Status: AC

## 2017-01-10 ENCOUNTER — Ambulatory Visit (INDEPENDENT_AMBULATORY_CARE_PROVIDER_SITE_OTHER): Payer: Medicaid Other | Admitting: Certified Nurse Midwife

## 2017-01-10 ENCOUNTER — Other Ambulatory Visit (HOSPITAL_COMMUNITY)
Admission: RE | Admit: 2017-01-10 | Discharge: 2017-01-10 | Disposition: A | Discharge status: Home / Self Care | Payer: Medicaid Other | Source: Ambulatory Visit | Attending: Certified Nurse Midwife | Admitting: Certified Nurse Midwife

## 2017-01-10 ENCOUNTER — Encounter: Payer: Self-pay | Admitting: Certified Nurse Midwife

## 2017-01-10 VITALS — BP 115/71 | HR 78 | Temp 97.4°F | Wt 220.8 lb

## 2017-01-10 DIAGNOSIS — A568 Sexually transmitted chlamydial infection of other sites: Secondary | ICD-10-CM

## 2017-01-10 DIAGNOSIS — O98313 Other infections with a predominantly sexual mode of transmission complicating pregnancy, third trimester: Principal | ICD-10-CM

## 2017-01-10 DIAGNOSIS — A5901 Trichomonal vulvovaginitis: Secondary | ICD-10-CM

## 2017-01-10 DIAGNOSIS — O23593 Infection of other part of genital tract in pregnancy, third trimester: Secondary | ICD-10-CM | POA: Diagnosis present

## 2017-01-10 DIAGNOSIS — Z30011 Encounter for initial prescription of contraceptive pills: Secondary | ICD-10-CM

## 2017-01-10 DIAGNOSIS — A749 Chlamydial infection, unspecified: Secondary | ICD-10-CM | POA: Diagnosis not present

## 2017-01-10 DIAGNOSIS — O98813 Other maternal infectious and parasitic diseases complicating pregnancy, third trimester: Secondary | ICD-10-CM | POA: Diagnosis not present

## 2017-01-10 MED ORDER — NORETHIN-ETH ESTRAD-FE BIPHAS 1 MG-10 MCG / 10 MCG PO TABS
1.0000 | ORAL_TABLET | Freq: Every day | ORAL | 4 refills | Status: DC
Start: 2017-01-10 — End: 2021-11-05

## 2017-01-10 NOTE — Progress Notes (Signed)
  Subjective:     Kristen Santos is a 31 y.o. female who presents for a postpartum visit. She is 4 weeks postpartum following a spontaneous vaginal delivery. I have fully reviewed the prenatal and intrapartum course. The delivery was at 41 gestational weeks. Outcome: spontaneous vaginal delivery. Anesthesia: epidural. Postpartum course has been UNREMARKABLE. Baby's course has been UNREMARKABLE. Baby is feeding by bottle - Similac Advance. Bleeding no bleeding. Bowel function is normal. Bladder function is normal. Patient is not sexually active. Contraception method is none. Postpartum depression screening: negative.  The following portions of the patient's history were reviewed and updated as appropriate: allergies, current medications, past family history, past medical history, past social history, past surgical history and problem list.  Review of Systems Pertinent items noted in HPI and remainder of comprehensive ROS otherwise negative.   Objective:    There were no vitals taken for this visit.  General:  alert, cooperative and no distress   Breasts:  inspection negative, no nipple discharge or bleeding, no masses or nodularity palpable  Lungs: clear to auscultation bilaterally  Heart:  regular rate and rhythm, S1, S2 normal, no murmur, click, rub or gallop  Abdomen: soft, non-tender; bowel sounds normal; no masses,  no organomegaly  pelvic Exam: Not performed.        Assessment:     Normal 4 week postpartum exam. Pap smear not done at today's visit.    Contraception management   H/O Chlamydia and Trich: TOC today  Plan:    1. Contraception: abstinence and OCP (estrogen/progesterone) 2.  To start on OCPs in 2 weeks, condoms encouraged.  3. Follow up in: 5 months or as needed.

## 2017-01-11 LAB — CERVICOVAGINAL ANCILLARY ONLY
Chlamydia: NEGATIVE
Neisseria Gonorrhea: NEGATIVE
Trichomonas: NEGATIVE

## 2017-06-03 ENCOUNTER — Emergency Department (HOSPITAL_COMMUNITY)
Admission: EM | Admit: 2017-06-03 | Discharge: 2017-06-03 | Disposition: A | Discharge status: Home / Self Care | Payer: Self-pay | Attending: Emergency Medicine | Admitting: Emergency Medicine

## 2017-06-03 ENCOUNTER — Encounter (HOSPITAL_COMMUNITY): Payer: Self-pay | Admitting: *Deleted

## 2017-06-03 ENCOUNTER — Emergency Department (HOSPITAL_COMMUNITY): Payer: Self-pay

## 2017-06-03 DIAGNOSIS — Y929 Unspecified place or not applicable: Secondary | ICD-10-CM | POA: Insufficient documentation

## 2017-06-03 DIAGNOSIS — Y9389 Activity, other specified: Secondary | ICD-10-CM | POA: Insufficient documentation

## 2017-06-03 DIAGNOSIS — Y999 Unspecified external cause status: Secondary | ICD-10-CM | POA: Insufficient documentation

## 2017-06-03 DIAGNOSIS — S63692A Other sprain of right middle finger, initial encounter: Secondary | ICD-10-CM | POA: Insufficient documentation

## 2017-06-03 DIAGNOSIS — Z79899 Other long term (current) drug therapy: Secondary | ICD-10-CM | POA: Insufficient documentation

## 2017-06-03 NOTE — ED Notes (Signed)
Ortho at bedside.

## 2017-06-03 NOTE — Progress Notes (Signed)
Orthopedic Tech Progress Note Patient Details:  Kristen Santos 03-25-1986 [id number]  Ortho Devices Type of Ortho Device: Arm sling, Short arm splint Ortho Device/Splint Location: applied short arm splint/tear drop splint to pt right hand (lower arm).  pt tolerated application very well.  provided and applied arm sling to pt right arm for support. right hand. Ortho Device/Splint Interventions: Application, Adjustment   Alvina Chou 06/03/2017, 4:54 PM
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
## 2017-06-03 NOTE — Discharge Instructions (Signed)
Please keep your splint clean and dry. You may cover with plastic bag when you shower. 800 mg of ibuprofen may be taken with food every 8 hours or 650 mg of Tylenol every 6 hours may be taken for pain control.  If your pain does not improve in the next 3-4 days, please call [facility] to schedule a follow-up appointment with the hand surgeon.  If you develop any new or worsening symptoms, including a new injury, numbness, or swelling to the finger, please return to the emergency department for reevaluation.
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
## 2017-10-31 ENCOUNTER — Other Ambulatory Visit: Payer: Self-pay

## 2017-10-31 ENCOUNTER — Emergency Department (HOSPITAL_COMMUNITY)
Admission: EM | Admit: 2017-10-31 | Discharge: 2017-10-31 | Disposition: A | Discharge status: Home / Self Care | Payer: Self-pay | Attending: Emergency Medicine | Admitting: Emergency Medicine

## 2017-10-31 ENCOUNTER — Encounter (HOSPITAL_COMMUNITY): Payer: Self-pay

## 2017-10-31 DIAGNOSIS — Z5321 Procedure and treatment not carried out due to patient leaving prior to being seen by health care provider: Secondary | ICD-10-CM | POA: Insufficient documentation

## 2017-10-31 DIAGNOSIS — M545 Low back pain: Secondary | ICD-10-CM | POA: Insufficient documentation

## 2017-10-31 LAB — I-STAT BETA HCG BLOOD, ED (MC, WL, AP ONLY)

## 2017-10-31 LAB — CBC
HEMATOCRIT: 39 % (ref 36.0–46.0)
Hemoglobin: 13 g/dL (ref 12.0–15.0)
MCH: 31.2 pg (ref 26.0–34.0)
MCHC: 33.3 g/dL (ref 30.0–36.0)
MCV: 93.5 fL (ref 78.0–100.0)
Platelets: 263 10*3/uL (ref 150–400)
RBC: 4.17 MIL/uL (ref 3.87–5.11)
RDW: 13.2 % (ref 11.5–15.5)
WBC: 5.7 10*3/uL (ref 4.0–10.5)

## 2017-10-31 LAB — BASIC METABOLIC PANEL
ANION GAP: 10 (ref 5–15)
BUN: 7 mg/dL (ref 6–20)
CALCIUM: 8.8 mg/dL — AB (ref 8.9–10.3)
CO2: 23 mmol/L (ref 22–32)
Chloride: 105 mmol/L (ref 101–111)
Creatinine, Ser: 0.83 mg/dL (ref 0.44–1.00)
GFR calc Af Amer: >60 mL/min (ref >60)
Glucose, Bld: 107 mg/dL — ABNORMAL HIGH (ref 65–99)
Potassium: 3.4 mmol/L — ABNORMAL LOW (ref 3.5–5.1)
SODIUM: 138 mmol/L (ref 135–145)

## 2017-10-31 LAB — URINALYSIS, ROUTINE W REFLEX MICROSCOPIC
Bilirubin Urine: NEGATIVE
Glucose, UA: NEGATIVE mg/dL
Hgb urine dipstick: NEGATIVE
KETONES UR: 5 mg/dL — AB
Nitrite: NEGATIVE
PH: 5 (ref 5.0–8.0)
PROTEIN: 30 mg/dL — AB
Specific Gravity, Urine: 1.021 (ref 1.005–1.030)

## 2017-10-31 NOTE — ED Triage Notes (Signed)
Per Pt, Pt is coming from home with lower right back pain that started Friday night. Pt denies any urinary symptoms.

## 2017-11-01 ENCOUNTER — Emergency Department (HOSPITAL_COMMUNITY)
Admission: EM | Admit: 2017-11-01 | Discharge: 2017-11-01 | Disposition: A | Discharge status: Home / Self Care | Payer: Self-pay | Attending: Emergency Medicine | Admitting: Emergency Medicine

## 2017-11-01 ENCOUNTER — Other Ambulatory Visit: Payer: Self-pay

## 2017-11-01 ENCOUNTER — Encounter (HOSPITAL_COMMUNITY): Payer: Self-pay | Admitting: *Deleted

## 2017-11-01 DIAGNOSIS — Y9389 Activity, other specified: Secondary | ICD-10-CM | POA: Insufficient documentation

## 2017-11-01 DIAGNOSIS — Y998 Other external cause status: Secondary | ICD-10-CM | POA: Insufficient documentation

## 2017-11-01 DIAGNOSIS — Z79899 Other long term (current) drug therapy: Secondary | ICD-10-CM | POA: Insufficient documentation

## 2017-11-01 DIAGNOSIS — Y929 Unspecified place or not applicable: Secondary | ICD-10-CM | POA: Insufficient documentation

## 2017-11-01 DIAGNOSIS — S39012A Strain of muscle, fascia and tendon of lower back, initial encounter: Secondary | ICD-10-CM | POA: Insufficient documentation

## 2017-11-01 DIAGNOSIS — T148XXA Other injury of unspecified body region, initial encounter: Secondary | ICD-10-CM

## 2017-11-01 DIAGNOSIS — Y33XXXA Other specified events, undetermined intent, initial encounter: Secondary | ICD-10-CM | POA: Insufficient documentation

## 2017-11-01 MED ORDER — LIDOCAINE 5 % EX PTCH: 1.0000 | MEDICATED_PATCH | CUTANEOUS | 0 refills | Status: DC

## 2017-11-01 MED ORDER — IBUPROFEN 800 MG PO TABS: 800.0000 mg | ORAL_TABLET | Freq: Three times a day (TID) | ORAL | 0 refills | Status: DC

## 2017-11-01 MED ORDER — CYCLOBENZAPRINE HCL 5 MG PO TABS: 5.0000 mg | ORAL_TABLET | Freq: Every evening | ORAL | 0 refills | Status: DC | PRN

## 2017-11-01 NOTE — ED Provider Notes (Signed)
[facility] Provider Note   CSN: [id number] Arrival date & time: 11/01/17  [id number]     History   Chief Complaint Chief Complaint  Patient presents with  . Back Pain    HPI Kristen Dunksriniece Woodcox is a 32 y.o. female presenting for evaluation of low back pain.  Patient states on Saturday morning she was putting her 4447-month-old down, leaning forward, when she had acute onset low back pain.  Since then, she has had persistent sharp pain, worse with movement.  No pain at rest.  Pain is located just to the right of the spine.  She denies history of similar.  She denies radiation of the pain.  Pain does not shoot down her buttock or back of her leg.  She denies numbness or tingling.  She denies fevers, chills, loss of bowel or bladder control, history of cancer or history of IV drug use.  She has been using Tylenol and ibuprofen with minimal improvement of the pain.   HPI  Past Medical History:  Diagnosis Date  . Chlamydia   . Ileus (HCC)   . Pregnant   . UTI (lower urinary tract infection)     Patient Active Problem List   Diagnosis Date Noted  . Post-dates pregnancy 12/11/2016  . Screening for STD (sexually transmitted disease) 12/09/2016  . Trichomonal vaginitis during pregnancy in third trimester 11/29/2016  . H/O influenza 10/25/2016  . Supervision of normal pregnancy, antepartum 06/06/2016  . Chlamydia trachomatis infection in mother during third trimester of pregnancy 05/18/2016    Past Surgical History:  Procedure Laterality Date  . ABDOMINAL SURGERY      OB History    Gravida Para Term Preterm AB Living   4 2 2   2 2    SAB TAB Ectopic Multiple Live Births   2     0 1       Home Medications    Prior to Admission medications   Medication Sig Start Date End Date Taking? Authorizing Provider  cyclobenzaprine (FLEXERIL) 5 MG tablet Take 1 tablet (5 mg total) by mouth at bedtime as needed for muscle spasms. 11/01/17   Nithila Sumners,  Zaedyn Covin, PA-C  Flaxseed, Linseed, (FLAX SEED OIL PO) Take 1 capsule by mouth daily.    [provider]  ibuprofen (ADVIL,MOTRIN) 800 MG tablet Take 1 tablet (800 mg total) by mouth 3 (three) times daily with meals. 11/01/17   Festus Pursel, PA-C  lidocaine (LIDODERM) 5 % Place 1 patch onto the skin daily. Remove & Discard patch within 12 hours or as directed by MD 11/01/17   Sherron Mapp, PA-C  Naphazoline HCl (CLEAR EYES OP) Place 1 drop into both eyes daily as needed (dry eyes).    [provider]  Norethindrone-Ethinyl Estradiol-Fe Biphas (LO LOESTRIN FE) 1 MG-10 MCG / 10 MCG tablet Take 1 tablet by mouth daily. Take 1 tablet by mouth daily. Patient not taking: Reported on 10/31/2017 01/10/17   Roe Coombsenney, Rachelle A, CNM    Family History Family History  Problem Relation Age of Onset  . Hypertension Mother   . Kidney disease Father     Social History Social History   Tobacco Use  . Smoking status: Never Smoker  . Smokeless tobacco: Never Used  Substance Use Topics  . Alcohol use: No  . Drug use: Yes    Types: Marijuana    Comment: last used 05/2016     Allergies   Penicillins   Review of Systems Review of  Systems  Constitutional: Negative for chills and fever.  Musculoskeletal: Positive for back pain.  Neurological: Negative for numbness.     Physical Exam Updated Vital Signs BP 104/66 (BP Location: Left Arm)   Pulse 74   Temp (!) 97.5 F (36.4 C) (Oral)   Resp 16   LMP 10/08/2017   SpO2 99%   Physical Exam  Constitutional: She is oriented to person, place, and time. She appears well-developed and well-nourished. No distress.  HENT:  Head: Normocephalic and atraumatic.  Eyes: EOM are normal.  Neck: Normal range of motion.  Cardiovascular: Normal rate, regular rhythm and intact distal pulses.  Pulmonary/Chest: Effort normal and breath sounds normal. No respiratory distress. She has no wheezes.  Abdominal: She exhibits no distension.    Musculoskeletal: Normal range of motion. She exhibits tenderness.  Tenderness to palpation of right low back paraspinal muscles.  No tenderness to palpation of midline spine.  No tenderness to palpation of the left side.  Patient is ambulatory.  Full active range of motion of the legs without difficulty.  Sensation of lower extremities intact bilaterally.  Pedal pulses equal bilaterally.  Neurological: She is alert and oriented to person, place, and time. She displays normal reflexes. No sensory deficit.  Skin: Skin is warm. No rash noted.  Psychiatric: She has a normal mood and affect.  Nursing note and vitals reviewed.    ED Treatments / Results  Labs (all labs ordered are listed, but only abnormal results are displayed) Labs Reviewed - No data to display  EKG  EKG Interpretation None       Radiology No results found.  Procedures Procedures (including critical care time)  Medications Ordered in ED Medications - No data to display   Initial Impression / Assessment and Plan / ED Course  I have reviewed the triage vital signs and the nursing notes.  Pertinent labs & imaging results that were available during my care of the patient were reviewed by me and considered in my medical decision making (see chart for details).     Patient presenting for evaluation of low back pain.  This began several days ago.  It is worse with movement.  Physical exam shows patient who is neurovascularly intact.  Tenderness to palpation of the paraspinal muscles without pain over midline spine.  Likely muscle strain.  I do not believe x-rays are necessary at this time.  No red flags for back pain.  Will treat with NSAIDs, muscle relaxer, lidocaine patch.  Patient to follow-up with primary care in 2 weeks if symptoms are not improving.  Discussed proper lifting techniques.  At this time, patient appears safe for discharge.  Return precautions given.  Patient states she understands and agrees to  plan.   Final Clinical Impressions(s) / ED Diagnoses   Final diagnoses:  Muscle strain    ED Discharge Orders        Ordered    ibuprofen (ADVIL,MOTRIN) 800 MG tablet  3 times daily with meals     11/01/17 0921    cyclobenzaprine (FLEXERIL) 5 MG tablet  At bedtime PRN     11/01/17 0921    lidocaine (LIDODERM) 5 %  Every 24 hours     11/01/17 0921       Alveria Apley, PA-C 11/01/17 0950    Benjiman Core, MD 11/01/17 1557

## 2017-11-01 NOTE — Discharge Instructions (Signed)
Take ibuprofen 3 times a day with meals.  Do not take other anti-inflammatories at the same time open (Advil, Motrin, naproxen, Aleve). You may supplement with Tylenol if you need further pain control. Use ice packs or heating pads if this helps control your pain. Use flexeril as needed for muscle stiffness or soreness.  Have caution, this may make you tired or groggy.  Do not drive or operate heavy machinery while taking this medicine. You may try the back exercises and stretches printed in this paperwork. Use lidocaine patches as needed for further pain control. Follow-up with the primary care doctor in 2 weeks if your symptoms are still persisting and not improving. It is very important that when you bend, use your legs, not your back. Return to the emergency room if you develop loss of bowel or bladder control, numbness, or any new or worsening symptoms.

## 2017-11-01 NOTE — ED Triage Notes (Signed)
Pt reports rt lower back pain that has been ongoing since Friday night. Pt denies any injury or urinary symptoms.

## 2017-12-14 ENCOUNTER — Encounter (HOSPITAL_COMMUNITY): Payer: Self-pay | Admitting: Emergency Medicine

## 2017-12-14 ENCOUNTER — Emergency Department (HOSPITAL_COMMUNITY): Admission: EM | Admit: 2017-12-14 | Discharge: 2017-12-14 | Discharge status: Against Medical Advice | Payer: Self-pay

## 2017-12-14 ENCOUNTER — Other Ambulatory Visit: Payer: Self-pay

## 2017-12-14 DIAGNOSIS — G8929 Other chronic pain: Secondary | ICD-10-CM | POA: Insufficient documentation

## 2017-12-14 DIAGNOSIS — M545 Low back pain: Secondary | ICD-10-CM | POA: Insufficient documentation

## 2017-12-14 DIAGNOSIS — Z79899 Other long term (current) drug therapy: Secondary | ICD-10-CM | POA: Insufficient documentation

## 2017-12-14 NOTE — ED Triage Notes (Signed)
Pt is c/o pain in her lower back on the right side that started on Monday  Pt states it radiates down into her right buttock  Pt states the pain increases with movement

## 2017-12-15 ENCOUNTER — Emergency Department (HOSPITAL_COMMUNITY)
Admission: EM | Admit: 2017-12-15 | Discharge: 2017-12-15 | Disposition: A | Discharge status: Home / Self Care | Payer: Self-pay | Attending: Emergency Medicine | Admitting: Emergency Medicine

## 2017-12-15 DIAGNOSIS — M545 Low back pain, unspecified: Secondary | ICD-10-CM

## 2017-12-15 DIAGNOSIS — G8929 Other chronic pain: Secondary | ICD-10-CM

## 2017-12-15 MED ORDER — METHOCARBAMOL 500 MG PO TABS: 500.0000 mg | ORAL_TABLET | Freq: Every evening | ORAL | 0 refills | Status: DC | PRN

## 2017-12-15 MED ORDER — NAPROXEN 500 MG PO TABS: 500.0000 mg | ORAL_TABLET | Freq: Two times a day (BID) | ORAL | 0 refills | Status: DC

## 2017-12-15 MED ORDER — DICLOFENAC SODIUM 1 % TD GEL: 2.0000 g | Freq: Four times a day (QID) | TRANSDERMAL | 0 refills | Status: DC

## 2017-12-15 NOTE — ED Provider Notes (Signed)
[facility]-EMERGENCY DEPT Provider Note   CSN: [id number] Arrival date & time: 12/14/17  2211     History   Chief Complaint Chief Complaint  Patient presents with  . Back Pain    HPI Kristen Santos is a 32 y.o. female with no pertinent past medical history presenting with bilateral lower back pain worse on the right side radiating into her right buttocks she describes the pain as an achiness and is aggravated by bending forward.  She has taken ibuprofen without relief.  She reports that approximately 2 weeks ago she had a lower back strain after lifting her daughter she improved somewhat but now she is having symptoms again.  Denies any new injury or trauma. She denies any fever, chills, loss of bowel bladder function, weakness, numbness, history of IV drug use or malignancy.  She denies any urinary symptoms.  HPI  Past Medical History:  Diagnosis Date  . Chlamydia   . Ileus (HCC)   . Pregnant   . UTI (lower urinary tract infection)     Patient Active Problem List   Diagnosis Date Noted  . Post-dates pregnancy 12/11/2016  . Screening for STD (sexually transmitted disease) 12/09/2016  . Trichomonal vaginitis during pregnancy in third trimester 11/29/2016  . H/O influenza 10/25/2016  . Supervision of normal pregnancy, antepartum 06/06/2016  . Chlamydia trachomatis infection in mother during third trimester of pregnancy 05/18/2016    Past Surgical History:  Procedure Laterality Date  . ABDOMINAL SURGERY       OB History    Gravida  4   Para  2   Term  2   Preterm      AB  2   Living  2     SAB  2   TAB      Ectopic      Multiple  0   Live Births  1            Home Medications    Prior to Admission medications   Medication Sig Start Date End Date Taking? Authorizing Provider  cyclobenzaprine (FLEXERIL) 5 MG tablet Take 1 tablet (5 mg total) by mouth at bedtime as needed for muscle spasms. 11/01/17   Caccavale, Sophia,  PA-C  diclofenac sodium (VOLTAREN) 1 % GEL Apply 2 g topically 4 (four) times daily. 12/15/17   Mathews Robinsons B, PA-C  Flaxseed, Linseed, (FLAX SEED OIL PO) Take 1 capsule by mouth daily.    [provider]  ibuprofen (ADVIL,MOTRIN) 800 MG tablet Take 1 tablet (800 mg total) by mouth 3 (three) times daily with meals. 11/01/17   Caccavale, Sophia, PA-C  lidocaine (LIDODERM) 5 % Place 1 patch onto the skin daily. Remove & Discard patch within 12 hours or as directed by MD 11/01/17   Caccavale, Sophia, PA-C  methocarbamol (ROBAXIN) 500 MG tablet Take 1 tablet (500 mg total) by mouth at bedtime as needed. 12/15/17   Mathews Robinsons B, PA-C  Naphazoline HCl (CLEAR EYES OP) Place 1 drop into both eyes daily as needed (dry eyes).    [provider]  naproxen (NAPROSYN) 500 MG tablet Take 1 tablet (500 mg total) by mouth 2 (two) times daily. 12/15/17   Mathews Robinsons B, PA-C  Norethindrone-Ethinyl Estradiol-Fe Biphas (LO LOESTRIN FE) 1 MG-10 MCG / 10 MCG tablet Take 1 tablet by mouth daily. Take 1 tablet by mouth daily. Patient not taking: Reported on 10/31/2017 01/10/17   Roe Coombs, CNM    Family History Family  History  Problem Relation Age of Onset  . Hypertension Mother   . Kidney disease Father     Social History Social History   Tobacco Use  . Smoking status: Never Smoker  . Smokeless tobacco: Never Used  Substance Use Topics  . Alcohol use: No  . Drug use: Not Currently    Types: Marijuana    Comment: last used 05/2016     Allergies   Penicillins   Review of Systems Review of Systems  Constitutional: Negative for chills, diaphoresis, fatigue, fever and unexpected weight change.  Gastrointestinal: Negative for abdominal pain, nausea and vomiting.  Genitourinary: Negative for decreased urine volume, difficulty urinating, dysuria, flank pain and frequency.  Musculoskeletal: Positive for back pain and myalgias. Negative for arthralgias, gait problem,  joint swelling, neck pain and neck stiffness.  Skin: Negative for color change, pallor, rash and wound.  Neurological: Negative for weakness and numbness.     Physical Exam Updated Vital Signs BP 104/71 (BP Location: Left Arm)   Pulse 69   Temp 97.9 F (36.6 C) (Oral)   Resp 18   LMP 11/21/2017   SpO2 99%   Physical Exam  Constitutional: She appears well-developed and well-nourished. No distress.  Well-appearing, afebrile nontoxic lying comfortably in bed no acute distress.  HENT:  Head: Normocephalic and atraumatic.  Neck: Normal range of motion. Neck supple.  Cardiovascular: Normal rate.  Pulmonary/Chest: Effort normal. No respiratory distress.  Abdominal: She exhibits no distension.  Musculoskeletal: Normal range of motion. She exhibits no edema or deformity.  Neurological: She is alert. No sensory deficit. She exhibits normal muscle tone. Coordination normal.  Normal stance and gait, normal balance, 5/5 strength in lower extremities bilaterally.  Strong dorsalis pedis pulses.  Neurovascularly intact.  Skin: Skin is warm and dry. No rash noted. She is not diaphoretic. No erythema. No pallor.  Nursing note and vitals reviewed.    ED Treatments / Results  Labs (all labs ordered are listed, but only abnormal results are displayed) Labs Reviewed - No data to display  EKG None  Radiology No results found.  Procedures Procedures (including critical care time)  Medications Ordered in ED Medications - No data to display   Initial Impression / Assessment and Plan / ED Course  I have reviewed the triage vital signs and the nursing notes.  Pertinent labs & imaging results that were available during my care of the patient were reviewed by me and considered in my medical decision making (see chart for details).    Patient presents with nontraumatic low back pain, No midline tenderness palpation no injury or trauma.  Her pain is not reproducible on palpation. Reports a  lower back strain approximately 2 weeks ago while lifting her daughter.  Patient presents with lower back pain.  No gross neurological deficits and normal neuro exam.  Patient has no gait abnormality or concern for cauda equina.  No loss of bowel or bladder control, fever, night sweats, weight loss, h/o malignancy, or IVDU.    RICE protocol and pain medications indicated and discussed with patient.  Discharge home with symptomatic relief and back exercises with close follow-up with PCP if symptoms persist beyond a week. Discussed strict return precautions and advised to return to the emergency department if experiencing any new or worsening symptoms. Instructions were understood and patient agreed with discharge plan.  Final Clinical Impressions(s) / ED Diagnoses   Final diagnoses:  Chronic bilateral low back pain without sciatica    ED Discharge Orders  Ordered    diclofenac sodium (VOLTAREN) 1 % GEL  4 times daily     12/15/17 0301    methocarbamol (ROBAXIN) 500 MG tablet  At bedtime PRN     12/15/17 0301    naproxen (NAPROSYN) 500 MG tablet  2 times daily     12/15/17 0301       Georgiana Shore, PA-C 12/15/17 0315    Derwood Kaplan, MD 12/15/17 [phone]

## 2017-12-15 NOTE — Discharge Instructions (Signed)
As discussed, follow the back exercises provided and take your medication as prescribed.  Massage and warm compresses may be helpful as well.  Follow-up with your primary care provider if symptoms persist beyond a week.  Return if symptoms worsen, loss of bowel bladder function, fever, numbness, weakness or other new concerning symptoms in the meantime.

## 2018-04-11 ENCOUNTER — Emergency Department (HOSPITAL_COMMUNITY)
Admission: EM | Admit: 2018-04-11 | Discharge: 2018-04-11 | Disposition: A | Discharge status: Home / Self Care | Payer: Self-pay | Attending: Emergency Medicine | Admitting: Emergency Medicine

## 2018-04-11 ENCOUNTER — Encounter (HOSPITAL_COMMUNITY): Payer: Self-pay

## 2018-04-11 ENCOUNTER — Other Ambulatory Visit: Payer: Self-pay

## 2018-04-11 DIAGNOSIS — K0889 Other specified disorders of teeth and supporting structures: Secondary | ICD-10-CM | POA: Insufficient documentation

## 2018-04-11 MED ORDER — CLINDAMYCIN HCL 150 MG PO CAPS: 450.0000 mg | ORAL_CAPSULE | Freq: Three times a day (TID) | ORAL | 0 refills | Status: AC

## 2018-04-11 MED ORDER — CLINDAMYCIN HCL 150 MG PO CAPS: 450.0000 mg | ORAL_CAPSULE | Freq: Three times a day (TID) | ORAL | 0 refills | Status: DC

## 2018-04-11 NOTE — ED Triage Notes (Signed)
Right upper and lower dental pain. Pt states that she is due to get some teeth removed next month but the pani has increased. No relief with advil at home.

## 2018-04-11 NOTE — Discharge Instructions (Addendum)
You were given a prescription for antibiotics. Please take the antibiotic prescription fully.   Follow up with your dentist, Dr. Barbette MerinoJensen as already scheduled.   Please follow up with your primary care provider within 5-7 days for re-evaluation of your symptoms. If you do not have a primary care provider, information for a healthcare clinic has been provided for you to make arrangements for follow up care. Please return to the emergency department for any new or worsening symptoms.

## 2018-04-11 NOTE — ED Provider Notes (Signed)
[facility]-EMERGENCY DEPT Provider Note   CSN: [id number] Arrival date & time: 04/11/18  1606     History   Chief Complaint Chief Complaint  Patient presents with  . Dental Pain    HPI Kristen Santos is a 32 y.o. female.  HPI   Pt is a 32 y/o female who presents to the ED c/o right upper and lower dental pain that began 2 days ago. States pain is constant and rated at 10/10. Cold temperatures make the pain worse. Has tried tylenol and motrin with no relief. No fevers. Mild right sided facial swelling presents. No sore throat or difficulty with PO at home. No URI sxs.   Past Medical History:  Diagnosis Date  . Chlamydia   . Ileus (HCC)   . Pregnant   . UTI (lower urinary tract infection)     Patient Active Problem List   Diagnosis Date Noted  . Post-dates pregnancy 12/11/2016  . Screening for STD (sexually transmitted disease) 12/09/2016  . Trichomonal vaginitis during pregnancy in third trimester 11/29/2016  . H/O influenza 10/25/2016  . Supervision of normal pregnancy, antepartum 06/06/2016  . Chlamydia trachomatis infection in mother during third trimester of pregnancy 05/18/2016    Past Surgical History:  Procedure Laterality Date  . ABDOMINAL SURGERY       OB History    Gravida  4   Para  2   Term  2   Preterm      AB  2   Living  2     SAB  2   TAB      Ectopic      Multiple  0   Live Births  1            Home Medications    Prior to Admission medications   Medication Sig Start Date End Date Taking? Authorizing Provider  clindamycin (CLEOCIN) 150 MG capsule Take 3 capsules (450 mg total) by mouth 3 (three) times daily for 7 days. 04/11/18 04/18/18  Santasia Rew S, PA-C  cyclobenzaprine (FLEXERIL) 5 MG tablet Take 1 tablet (5 mg total) by mouth at bedtime as needed for muscle spasms. 11/01/17   Caccavale, Sophia, PA-C  diclofenac sodium (VOLTAREN) 1 % GEL Apply 2 g topically 4 (four) times daily. 12/15/17    Mathews Robinsons B, PA-C  Flaxseed, Linseed, (FLAX SEED OIL PO) Take 1 capsule by mouth daily.    [provider]  ibuprofen (ADVIL,MOTRIN) 800 MG tablet Take 1 tablet (800 mg total) by mouth 3 (three) times daily with meals. 11/01/17   Caccavale, Sophia, PA-C  lidocaine (LIDODERM) 5 % Place 1 patch onto the skin daily. Remove & Discard patch within 12 hours or as directed by MD 11/01/17   Caccavale, Sophia, PA-C  methocarbamol (ROBAXIN) 500 MG tablet Take 1 tablet (500 mg total) by mouth at bedtime as needed. 12/15/17   Mathews Robinsons B, PA-C  Naphazoline HCl (CLEAR EYES OP) Place 1 drop into both eyes daily as needed (dry eyes).    [provider]  naproxen (NAPROSYN) 500 MG tablet Take 1 tablet (500 mg total) by mouth 2 (two) times daily. 12/15/17   Mathews Robinsons B, PA-C  Norethindrone-Ethinyl Estradiol-Fe Biphas (LO LOESTRIN FE) 1 MG-10 MCG / 10 MCG tablet Take 1 tablet by mouth daily. Take 1 tablet by mouth daily. Patient not taking: Reported on 10/31/2017 01/10/17   Roe Coombs, CNM    Family History Family History  Problem Relation Age of  Onset  . Hypertension Mother   . Kidney disease Father     Social History Social History   Tobacco Use  . Smoking status: Never Smoker  . Smokeless tobacco: Never Used  Substance Use Topics  . Alcohol use: No  . Drug use: Not Currently    Types: Marijuana    Comment: last used 05/2016     Allergies   Penicillins   Review of Systems Review of Systems  Constitutional: Negative for fever.  HENT: Positive for dental problem and facial swelling. Negative for congestion, rhinorrhea and sore throat.   Neurological: Positive for headaches.   Physical Exam Updated Vital Signs BP 106/66 (BP Location: Right Arm)   Pulse 61   Temp 98.4 F (36.9 C) (Oral)   Resp 16   Ht 5\' 5"  (1.651 m)   Wt 86.2 kg (190 lb)   SpO2 100%   BMI 31.62 kg/m   Physical Exam  Constitutional: She is oriented to person, place, and  time. She appears well-developed and well-nourished. No distress.  HENT:  Head: Normocephalic and atraumatic.  Right Ear: External ear normal.  Left Ear: External ear normal.  Mouth/Throat: Oropharynx is clear and moist.  Bilateral TMs without erythema or effusion.  Diffuse dental caries in the mouth.  Multiple fractured teeth and multiple missing teeth.  There is a fractured molar to the right upper part of the mouth that is tender to percussion.  No gross dental abscess noted.  No pharyngeal erythema.  No tonsillar swelling or exudates.  Uvula midline.  No swelling beneath the tongue.  No cervical adenopathy.  Eyes: Pupils are equal, round, and reactive to light. Conjunctivae and EOM are normal.  Neck: Neck supple.  Cardiovascular: Normal rate.  Pulmonary/Chest: Effort normal.  Musculoskeletal: Normal range of motion.  Neurological: She is alert and oriented to person, place, and time. No cranial nerve deficit.  Skin: Skin is warm and dry.  Psychiatric: She has a normal mood and affect.  Nursing note and vitals reviewed.   ED Treatments / Results  Labs (all labs ordered are listed, but only abnormal results are displayed) Labs Reviewed - No data to display  EKG None  Radiology No results found.  Procedures Procedures (including critical care time)  Medications Ordered in ED Medications - No data to display   Initial Impression / Assessment and Plan / ED Course  I have reviewed the triage vital signs and the nursing notes.  Pertinent labs & imaging results that were available during my care of the patient were reviewed by me and considered in my medical decision making (see chart for details).     Final Clinical Impressions(s) / ED Diagnoses   Final diagnoses:  Pain, dental   Patient with toothache.  No gross abscess.  Exam unconcerning for Ludwig's angina or spread of infection.  Will treat with abx and pain medicine.  Urged patient to follow-up with dentist.      ED Discharge Orders        Ordered    clindamycin (CLEOCIN) 150 MG capsule  3 times daily,   Status:  Discontinued     04/11/18 1801    clindamycin (CLEOCIN) 150 MG capsule  3 times daily     04/11/18 [id number]       Keaten Mashek S, PA-C 04/11/18 1802    Rolan BuccoBelfi, Melanie, MD 04/11/18 1941

## 2018-05-02 ENCOUNTER — Emergency Department (HOSPITAL_COMMUNITY)
Admission: EM | Admit: 2018-05-02 | Discharge: 2018-05-02 | Disposition: A | Discharge status: Home / Self Care | Payer: Self-pay | Attending: Emergency Medicine | Admitting: Emergency Medicine

## 2018-05-02 ENCOUNTER — Other Ambulatory Visit: Payer: Self-pay

## 2018-05-02 DIAGNOSIS — Z79899 Other long term (current) drug therapy: Secondary | ICD-10-CM | POA: Insufficient documentation

## 2018-05-02 DIAGNOSIS — N3001 Acute cystitis with hematuria: Secondary | ICD-10-CM | POA: Insufficient documentation

## 2018-05-02 LAB — URINALYSIS, MICROSCOPIC (REFLEX)
RBC / HPF: >50 RBC/hpf (ref 0–5)
WBC, UA: >50 WBC/hpf (ref 0–5)

## 2018-05-02 LAB — PREGNANCY, URINE: PREG TEST UR: NEGATIVE

## 2018-05-02 LAB — URINALYSIS, ROUTINE W REFLEX MICROSCOPIC
Bilirubin Urine: NEGATIVE
GLUCOSE, UA: NEGATIVE mg/dL
KETONES UR: NEGATIVE mg/dL
Nitrite: POSITIVE — AB
PH: 6 (ref 5.0–8.0)
Protein, ur: 100 mg/dL — AB
SPECIFIC GRAVITY, URINE: 1.025 (ref 1.005–1.030)

## 2018-05-02 LAB — WET PREP, GENITAL
Sperm: NONE SEEN
TRICH WET PREP: NONE SEEN
YEAST WET PREP: NONE SEEN

## 2018-05-02 MED ORDER — AZITHROMYCIN 250 MG PO TABS
1000.0000 mg | ORAL_TABLET | Freq: Once | ORAL | Status: AC
  Administered 2018-05-02: 1000 mg via ORAL
  Filled 2018-05-02: qty 4

## 2018-05-02 MED ORDER — NITROFURANTOIN MONOHYD MACRO 100 MG PO CAPS
100.0000 mg | ORAL_CAPSULE | Freq: Once | ORAL | Status: AC
  Administered 2018-05-02: 100 mg via ORAL
  Filled 2018-05-02: qty 1

## 2018-05-02 MED ORDER — NITROFURANTOIN MONOHYD MACRO 100 MG PO CAPS: 100.0000 mg | ORAL_CAPSULE | Freq: Two times a day (BID) | ORAL | 0 refills | Status: DC

## 2018-05-02 MED ORDER — DOXYCYCLINE HYCLATE 100 MG PO TABS
100.0000 mg | ORAL_TABLET | Freq: Once | ORAL | Status: AC
  Administered 2018-05-02: 100 mg via ORAL
  Filled 2018-05-02: qty 1

## 2018-05-02 MED ORDER — DOXYCYCLINE HYCLATE 100 MG PO CAPS: 100.0000 mg | ORAL_CAPSULE | Freq: Two times a day (BID) | ORAL | 0 refills | Status: DC

## 2018-05-02 NOTE — Discharge Instructions (Addendum)
Please take the medicines prescribed to cover for UTI and STD exposures.

## 2018-05-02 NOTE — ED Provider Notes (Signed)
[facility] EMERGENCY DEPARTMENT Provider Note   CSN: [id number] Arrival date & time: 05/02/18  0707     History   Chief Complaint Chief Complaint  Patient presents with  . Dysuria    HPI Kristen Santos is a 32 y.o. female.  HPI  32 year old female comes in with chief complaint of pelvic discomfort.  Patient notes that over the past 2 days she has noted some discomfort in her pelvic area when she urinates.  Her symptoms have become more constant and she noted some blood in her urine today therefore she decided to come to the ER.  Past Medical History:  Diagnosis Date  . Chlamydia   . Ileus (HCC)   . Pregnant   . UTI (lower urinary tract infection)     Patient Active Problem List   Diagnosis Date Noted  . Post-dates pregnancy 12/11/2016  . Screening for STD (sexually transmitted disease) 12/09/2016  . Trichomonal vaginitis during pregnancy in third trimester 11/29/2016  . H/O influenza 10/25/2016  . Supervision of normal pregnancy, antepartum 06/06/2016  . Chlamydia trachomatis infection in mother during third trimester of pregnancy 05/18/2016    Past Surgical History:  Procedure Laterality Date  . ABDOMINAL SURGERY       OB History    Gravida  4   Para  2   Term  2   Preterm      AB  2   Living  2     SAB  2   TAB      Ectopic      Multiple  0   Live Births  1            Home Medications    Prior to Admission medications   Medication Sig Start Date End Date Taking? Authorizing Provider  Flaxseed, Linseed, (FLAX SEED OIL PO) Take 1 capsule by mouth daily as needed.    Yes [provider]  Multiple Vitamin (MULTIVITAMIN) tablet Take 1 tablet by mouth daily as needed.   Yes [provider]  Naphazoline HCl (CLEAR EYES OP) Place 1 drop into both eyes daily as needed (dry eyes).   Yes [provider]  cyclobenzaprine (FLEXERIL) 5 MG tablet Take 1 tablet (5 mg total) by mouth at bedtime as  needed for muscle spasms. Patient not taking: Reported on 05/02/2018 11/01/17   Caccavale, Sophia, PA-C  diclofenac sodium (VOLTAREN) 1 % GEL Apply 2 g topically 4 (four) times daily. Patient not taking: Reported on 05/02/2018 12/15/17   Mathews Robinsons B, PA-C  doxycycline (VIBRAMYCIN) 100 MG capsule Take 1 capsule (100 mg total) by mouth 2 (two) times daily. 05/02/18   Derwood Kaplan, MD  ibuprofen (ADVIL,MOTRIN) 800 MG tablet Take 1 tablet (800 mg total) by mouth 3 (three) times daily with meals. Patient not taking: Reported on 05/02/2018 11/01/17   Caccavale, Sophia, PA-C  lidocaine (LIDODERM) 5 % Place 1 patch onto the skin daily. Remove & Discard patch within 12 hours or as directed by MD Patient not taking: Reported on 05/02/2018 11/01/17   Caccavale, Sophia, PA-C  methocarbamol (ROBAXIN) 500 MG tablet Take 1 tablet (500 mg total) by mouth at bedtime as needed. Patient not taking: Reported on 05/02/2018 12/15/17   Mathews Robinsons B, PA-C  naproxen (NAPROSYN) 500 MG tablet Take 1 tablet (500 mg total) by mouth 2 (two) times daily. Patient not taking: Reported on 05/02/2018 12/15/17   Georgiana Shore, PA-C  nitrofurantoin, macrocrystal-monohydrate, (MACROBID) 100 MG capsule Take 1  capsule (100 mg total) by mouth 2 (two) times daily. 05/02/18   Derwood KaplanNanavati, Reya Aurich, MD  Norethindrone-Ethinyl Estradiol-Fe Biphas (LO LOESTRIN FE) 1 MG-10 MCG / 10 MCG tablet Take 1 tablet by mouth daily. Take 1 tablet by mouth daily. Patient not taking: Reported on 10/31/2017 01/10/17   Roe Coombsenney, Rachelle A, CNM    Family History Family History  Problem Relation Age of Onset  . Hypertension Mother   . Kidney disease Father     Social History Social History   Tobacco Use  . Smoking status: Never Smoker  . Smokeless tobacco: Never Used  Substance Use Topics  . Alcohol use: No  . Drug use: Not Currently    Types: Marijuana    Comment: last used 05/2016     Allergies   Penicillins   Review of Systems Review  of Systems  Constitutional: Positive for activity change.  Gastrointestinal: Negative for nausea and vomiting.  Genitourinary: Positive for dysuria, hematuria and vaginal discharge. Negative for vaginal bleeding.  Musculoskeletal: Negative for back pain.     Physical Exam Updated Vital Signs BP 108/65   Pulse 84   Temp 98.3 F (36.8 C) (Oral)   Resp 14   LMP 04/18/2018   SpO2 100%   Breastfeeding? No   Physical Exam  Constitutional: She is oriented to person, place, and time. She appears well-developed.  HENT:  Head: Normocephalic and atraumatic.  Eyes: Pupils are equal, round, and reactive to light. Conjunctivae and EOM are normal.  Neck: Normal range of motion. Neck supple.  Cardiovascular: Normal rate, regular rhythm and intact distal pulses.  Pulmonary/Chest: Effort normal. No respiratory distress. She has no wheezes.  Abdominal: Soft. Bowel sounds are normal. She exhibits no distension. There is no tenderness.  Genitourinary: Vagina normal and uterus normal.  Genitourinary Comments: External exam - normal, no lesions Speculum exam: Pt has some white discharge, no blood Bimanual exam: Patient has no CMT, no adnexal tenderness or fullness and cervical os is closed  Neurological: She is alert and oriented to person, place, and time.  Skin: Skin is warm and dry.  Nursing note and vitals reviewed.    ED Treatments / Results  Labs (all labs ordered are listed, but only abnormal results are displayed) Labs Reviewed  URINALYSIS, ROUTINE W REFLEX MICROSCOPIC - Abnormal; Notable for the following components:      Result Value   Color, Urine BROWN (*)    APPearance TURBID (*)    Hgb urine dipstick LARGE (*)    Protein, ur 100 (*)    Nitrite POSITIVE (*)    Leukocytes, UA SMALL (*)    All other components within normal limits  URINALYSIS, MICROSCOPIC (REFLEX) - Abnormal; Notable for the following components:   Bacteria, UA FEW (*)    All other components within normal  limits  WET PREP, GENITAL  GC/CHLAMYDIA PROBE AMP [facility]    EKG None  Radiology No results found.  Procedures Procedures (including critical care time)  Medications Ordered in ED Medications  azithromycin (ZITHROMAX) tablet 1,000 mg (has no administration in time range)  doxycycline (VIBRA-TABS) tablet 100 mg (has no administration in time range)  nitrofurantoin (macrocrystal-monohydrate) (MACROBID) capsule 100 mg (has no administration in time range)     Initial Impression / Assessment and Plan / ED Course  I have reviewed the triage vital signs and the nursing notes.  Pertinent labs & imaging results that were available during my care of the patient were reviewed by  me and considered in my medical decision making (see chart for details).     32 year old female with history of STDs comes in with chief complaint of urinary discomfort.  Patient also has vaginal discharge and reports that she has been having unprotected intercourse with her ex.  UA is nitrite positive, however pelvic exam also revealed vaginal discharge that was thickened + yellow.  Patient prefers being covered for STD, because she has had a chlamydia infection in the past.  She also however reports that she has had UTIs in the past with similar symptoms, therefore we will also send her home with Macrobid.  Final Clinical Impressions(s) / ED Diagnoses   Final diagnoses:  Acute cystitis with hematuria    ED Discharge Orders         Ordered    doxycycline (VIBRAMYCIN) 100 MG capsule  2 times daily     05/02/18 1019    nitrofurantoin, macrocrystal-monohydrate, (MACROBID) 100 MG capsule  2 times daily     05/02/18 1019           Derwood KaplanNanavati, Berenise Hunton, MD 05/02/18 1028

## 2018-05-02 NOTE — ED Triage Notes (Signed)
Pt c/o discomfort post void onset yesterday, reports hematuria today, pt reports new white vaginal discharge, pt denies fever and chills

## 2018-05-02 NOTE — ED Notes (Signed)
Patient c/o pressure when urinating onset several days ago, this am c/o hematuria.

## 2018-05-03 LAB — GC/CHLAMYDIA PROBE AMP (~~LOC~~) NOT AT ARMC
Chlamydia: NEGATIVE
Neisseria Gonorrhea: NEGATIVE

## 2020-05-06 ENCOUNTER — Ambulatory Visit: Payer: Self-pay | Admitting: Family Medicine

## 2020-05-07 ENCOUNTER — Encounter: Payer: Self-pay | Admitting: Family Medicine

## 2021-02-10 ENCOUNTER — Ambulatory Visit: Payer: Self-pay | Admitting: Internal Medicine

## 2021-03-02 DIAGNOSIS — J040 Acute laryngitis: Secondary | ICD-10-CM | POA: Diagnosis not present

## 2021-03-02 DIAGNOSIS — Z20822 Contact with and (suspected) exposure to covid-19: Secondary | ICD-10-CM | POA: Diagnosis not present

## 2021-03-02 DIAGNOSIS — J019 Acute sinusitis, unspecified: Secondary | ICD-10-CM | POA: Diagnosis not present

## 2021-03-02 DIAGNOSIS — R051 Acute cough: Secondary | ICD-10-CM | POA: Diagnosis not present

## 2021-03-04 DIAGNOSIS — R051 Acute cough: Secondary | ICD-10-CM | POA: Diagnosis not present

## 2021-03-04 DIAGNOSIS — J040 Acute laryngitis: Secondary | ICD-10-CM | POA: Diagnosis not present

## 2021-03-04 DIAGNOSIS — Z20822 Contact with and (suspected) exposure to covid-19: Secondary | ICD-10-CM | POA: Diagnosis not present

## 2021-03-04 DIAGNOSIS — J019 Acute sinusitis, unspecified: Secondary | ICD-10-CM | POA: Diagnosis not present

## 2021-03-05 ENCOUNTER — Encounter (HOSPITAL_COMMUNITY): Payer: Self-pay | Admitting: *Deleted

## 2021-03-05 ENCOUNTER — Emergency Department (HOSPITAL_COMMUNITY)
Admission: EM | Admit: 2021-03-05 | Discharge: 2021-03-05 | Disposition: A | Discharge status: Home / Self Care | Payer: Federal, State, Local not specified - PPO | Attending: Emergency Medicine | Admitting: Emergency Medicine

## 2021-03-05 ENCOUNTER — Other Ambulatory Visit: Payer: Self-pay

## 2021-03-05 ENCOUNTER — Emergency Department (HOSPITAL_COMMUNITY): Payer: Federal, State, Local not specified - PPO

## 2021-03-05 DIAGNOSIS — U071 COVID-19: Secondary | ICD-10-CM | POA: Diagnosis not present

## 2021-03-05 DIAGNOSIS — R059 Cough, unspecified: Secondary | ICD-10-CM | POA: Diagnosis not present

## 2021-03-05 DIAGNOSIS — Z5321 Procedure and treatment not carried out due to patient leaving prior to being seen by health care provider: Secondary | ICD-10-CM | POA: Diagnosis not present

## 2021-03-05 DIAGNOSIS — R21 Rash and other nonspecific skin eruption: Secondary | ICD-10-CM | POA: Diagnosis not present

## 2021-03-05 LAB — RESP PANEL BY RT-PCR (FLU A&B, COVID) ARPGX2
Influenza A by PCR: NEGATIVE
Influenza B by PCR: NEGATIVE
SARS Coronavirus 2 by RT PCR: POSITIVE — AB

## 2021-03-05 NOTE — ED Notes (Signed)
Pt not in the lobby at this time for vital sign update. Called 2x

## 2021-03-05 NOTE — ED Provider Notes (Signed)
Emergency Medicine Provider Triage Evaluation Note  Ronesha Heenan , a 35 y.o. female  was evaluated in triage.  Pt complains of cough for two days.  Review of Systems  Positive: Cough, diarrhea Negative: SOB, vomiting, chest pain, fever, hemoptysis  Physical Exam  BP 114/74 (BP Location: Left Arm)   Pulse 82   Temp 98.3 F (36.8 C) (Oral)   Resp 16   LMP 02/19/2021   SpO2 99%  Gen:   Awake, no distress   Resp:  Normal effort, lungs clear MSK:   Moves extremities without difficulty  Other:    Medical Decision Making  Medically screening exam initiated at 2:35 PM.  Appropriate orders placed.  Kanon Townley was informed that the remainder of the evaluation will be completed by another provider, this initial triage assessment does not replace that evaluation, and the importance of remaining in the ED until their evaluation is complete.     Anselm Pancoast, PA-C 03/05/21 1437    Koleen Distance, MD 03/05/21 [phone]

## 2021-03-05 NOTE — ED Notes (Signed)
Called pt for vitals no answer 

## 2021-03-05 NOTE — ED Triage Notes (Signed)
Pt reports having two days of productive cough and just not feeling well.

## 2021-03-05 NOTE — ED Notes (Signed)
Called for vitals no answer °

## 2021-05-11 DIAGNOSIS — R1032 Left lower quadrant pain: Secondary | ICD-10-CM | POA: Diagnosis not present

## 2021-07-05 ENCOUNTER — Emergency Department (HOSPITAL_COMMUNITY)
Admission: EM | Admit: 2021-07-05 | Discharge: 2021-07-05 | Disposition: A | Discharge status: Home / Self Care | Payer: Federal, State, Local not specified - PPO | Attending: Emergency Medicine | Admitting: Emergency Medicine

## 2021-07-05 ENCOUNTER — Emergency Department (HOSPITAL_COMMUNITY): Payer: Federal, State, Local not specified - PPO

## 2021-07-05 DIAGNOSIS — M25571 Pain in right ankle and joints of right foot: Secondary | ICD-10-CM | POA: Insufficient documentation

## 2021-07-05 DIAGNOSIS — M7989 Other specified soft tissue disorders: Secondary | ICD-10-CM | POA: Diagnosis not present

## 2021-07-05 MED ORDER — NAPROXEN 500 MG PO TABS: 500.0000 mg | ORAL_TABLET | Freq: Two times a day (BID) | ORAL | 0 refills | Status: DC

## 2021-07-05 NOTE — ED Triage Notes (Addendum)
Pt said x 2 day has had swelling in her right ankle. Does stand on  her feet at work a lot. NO injury.

## 2021-07-05 NOTE — ED Provider Notes (Signed)
[facility] EMERGENCY DEPARTMENT Provider Note   CSN: [id number] Arrival date & time: 07/05/21  9937     History Chief Complaint  Patient presents with   Ankle Pain    Kristen Santos is a 35 y.o. female.  HPI  Patient presents with right ankle swelling.  Started 2 to 3 days ago, denies any inciting incident.  She works on her feet all day, denies any prior trauma to the ankle.  There is swelling and pain with movement, has tried Tylenol with minimal relief.  Walking and standing on it is an aggravating factor.  No fevers or chills.  No warmth surrounding the ankle, no skin color changes.  Not warm to the touch, no tenderness to the posterior calf.  Past Medical History:  Diagnosis Date   Chlamydia    Ileus [facility])    Pregnant    UTI (lower urinary tract infection)     Patient Active Problem List   Diagnosis Date Noted   Post-dates pregnancy 12/11/2016   Screening for STD (sexually transmitted disease) 12/09/2016   Trichomonal vaginitis during pregnancy in third trimester 11/29/2016   H/O influenza 10/25/2016   Supervision of normal pregnancy, antepartum 06/06/2016   Chlamydia trachomatis infection in mother during third trimester of pregnancy 05/18/2016    Past Surgical History:  Procedure Laterality Date   ABDOMINAL SURGERY       OB History     Gravida  4   Para  2   Term  2   Preterm      AB  2   Living  2      SAB  2   IAB      Ectopic      Multiple  0   Live Births  1           Family History  Problem Relation Age of Onset   Hypertension Mother    Kidney disease Father     Social History   Tobacco Use   Smoking status: Never   Smokeless tobacco: Never  Substance Use Topics   Alcohol use: No   Drug use: Not Currently    Types: Marijuana    Comment: last used 05/2016    Home Medications Prior to Admission medications   Medication Sig Start Date End Date Taking? Authorizing Provider  cyclobenzaprine  (FLEXERIL) 5 MG tablet Take 1 tablet (5 mg total) by mouth at bedtime as needed for muscle spasms. Patient not taking: Reported on 05/02/2018 11/01/17   Caccavale, Sophia, PA-C  diclofenac sodium (VOLTAREN) 1 % GEL Apply 2 g topically 4 (four) times daily. Patient not taking: Reported on 05/02/2018 12/15/17   Mathews Robinsons B, PA-C  doxycycline (VIBRAMYCIN) 100 MG capsule Take 1 capsule (100 mg total) by mouth 2 (two) times daily. 05/02/18   Nanavati, Ankit, MD  Flaxseed, Linseed, (FLAX SEED OIL PO) Take 1 capsule by mouth daily as needed.     [provider]  ibuprofen (ADVIL,MOTRIN) 800 MG tablet Take 1 tablet (800 mg total) by mouth 3 (three) times daily with meals. Patient not taking: Reported on 05/02/2018 11/01/17   Caccavale, Sophia, PA-C  lidocaine (LIDODERM) 5 % Place 1 patch onto the skin daily. Remove & Discard patch within 12 hours or as directed by MD Patient not taking: Reported on 05/02/2018 11/01/17   Caccavale, Sophia, PA-C  methocarbamol (ROBAXIN) 500 MG tablet Take 1 tablet (500 mg total) by mouth at bedtime as needed. Patient not taking: Reported on  05/02/2018 12/15/17   Mathews Robinsons B, PA-C  Multiple Vitamin (MULTIVITAMIN) tablet Take 1 tablet by mouth daily as needed.    [provider]  Naphazoline HCl (CLEAR EYES OP) Place 1 drop into both eyes daily as needed (dry eyes).    [provider]  naproxen (NAPROSYN) 500 MG tablet Take 1 tablet (500 mg total) by mouth 2 (two) times daily. Patient not taking: Reported on 05/02/2018 12/15/17   Georgiana Shore, PA-C  nitrofurantoin, macrocrystal-monohydrate, (MACROBID) 100 MG capsule Take 1 capsule (100 mg total) by mouth 2 (two) times daily. 05/02/18   Derwood Kaplan, MD  Norethindrone-Ethinyl Estradiol-Fe Biphas (LO LOESTRIN FE) 1 MG-10 MCG / 10 MCG tablet Take 1 tablet by mouth daily. Take 1 tablet by mouth daily. Patient not taking: Reported on 10/31/2017 01/10/17   Orvilla Cornwall A, CNM    Allergies     Penicillins  Review of Systems   Review of Systems  Constitutional:  Negative for fever.  Musculoskeletal:  Positive for gait problem, joint swelling and myalgias.  Skin:  Negative for color change.   Physical Exam Updated Vital Signs BP 105/64 (BP Location: Left Arm)   Pulse 72   Temp 98.2 F (36.8 C) (Oral)   Resp 18   LMP 07/05/2021   SpO2 100%   Physical Exam Vitals and nursing note reviewed. Exam conducted with a chaperone present.  Constitutional:      General: She is not in acute distress.    Appearance: Normal appearance.  HENT:     Head: Normocephalic and atraumatic.  Eyes:     General: No scleral icterus.    Extraocular Movements: Extraocular movements intact.     Pupils: Pupils are equal, round, and reactive to light.  Cardiovascular:     Pulses: Normal pulses.     Comments: DP and PT 2+ Musculoskeletal:        General: Swelling and tenderness present. No signs of injury. Normal range of motion.     Comments: Range of motion fully intact.  There is swelling to the lateral malleolus of the right ankle.  No point tenderness, but diffuse tenderness surrounding.  Negative Thompson, negative Homan  Skin:    General: Skin is warm.     Capillary Refill: Capillary refill takes less than 2 seconds.     Coloration: Skin is not jaundiced.     Findings: No erythema or rash.     Comments: No skin discoloration, not warm to the touch.  Neurological:     Mental Status: She is alert. Mental status is at baseline.     Coordination: Coordination normal.     Comments: Sensation to light touch fully intact to the lower extremities    ED Results / Procedures / Treatments   Labs (all labs ordered are listed, but only abnormal results are displayed) Labs Reviewed - No data to display  EKG None  Radiology DG Ankle Complete Right  Result Date: 07/05/2021 CLINICAL DATA:  Ankle pain and swelling EXAM: RIGHT ANKLE - COMPLETE 3+ VIEW COMPARISON:  02/01/2013 FINDINGS: Soft  tissue swelling without fracture, erosion, or subluxation. IMPRESSION: Soft tissue swelling osseous abnormality. Electronically Signed   By: Tiburcio Pea M.D.   On: 07/05/2021 05:14    Procedures Procedures   Medications Ordered in ED Medications - No data to display  ED Course  I have reviewed the triage vital signs and the nursing notes.  Pertinent labs & imaging results that were available during my care of  the patient were reviewed by me and considered in my medical decision making (see chart for details).    MDM Rules/Calculators/A&P                           Patient vitals are stable, she is ambulatory.  She is neurovascularly intact.  No skin changes concerning for cellulitis, doubt DVT.  Patient not febrile, doubt septic joint.  Doubt gout given lack of history and lack of erythema.  Suspect muscle strain secondary to her occupation.  X-rays are negative for any signs of bony involvement.  Negative Thompson, negative Homan.  Doubt Achilles tendon rupture.  Will provide supportive care, patient advised to follow-up with orthopedic if no improvement.  Final Clinical Impression(s) / ED Diagnoses Final diagnoses:  None    Rx / DC Orders ED Discharge Orders     None        Theron Arista, Cordelia Poche 07/05/21 0908    Benjiman Core, MD 07/05/21 2118

## 2021-07-05 NOTE — ED Notes (Signed)
Discharged by PA at triage. 

## 2021-07-05 NOTE — ED Provider Notes (Signed)
Emergency Medicine Provider Triage Evaluation Note  Kristen Santos , a 35 y.o. female  was evaluated in triage.  Pt complains of right ankle pain and swelling for the past several days.  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  Review of Systems  Positive: Right ankle pain Negative: numbness  Physical Exam  BP 112/67 (BP Location: Right Arm)   Pulse 65   Temp 98.2 F (36.8 C) (Oral)   Resp 16   LMP 07/05/2021   SpO2 99%   Gen:   Awake, no distress   Resp:  Normal effort  MSK:   Moves extremities without difficulty  Other:  Swelling along right lateral ankle, there is no acute deformity, DP pulse intact, remains ambulatory  Medical Decision Making  Medically screening exam initiated at 3:19 AM.  Appropriate orders placed.  Kristen Santos was informed that the remainder of the evaluation will be completed by another provider, this initial triage assessment does not replace that evaluation, and the importance of remaining in the ED until their evaluation is complete.  Right ankle pain/swelling.  X-ray ordered.   Kristen Hatchet, PA-C 07/05/21 0326    Kristen Conn, MD 07/05/21 [phone]

## 2021-07-05 NOTE — Discharge Instructions (Addendum)
Take naproxen twice daily for the next 5 days.  Take it with food and water. Keep your ankle elevated and ice it. You can use an Ace bandage to provide comfort. Although the x-ray did not show any signs of injury to the bone, you do have swelling.  This should resolve on its own with anti-inflammatory, icing, elevation.  However, if it does not improve in the next few days you should call to set up follow-up with an orthopedic group.  Information is provided above.  If things change or worsen please return back to the ED as needed

## 2021-08-27 ENCOUNTER — Ambulatory Visit: Payer: Federal, State, Local not specified - PPO | Admitting: Family Medicine

## 2021-09-09 ENCOUNTER — Ambulatory Visit: Payer: Federal, State, Local not specified - PPO | Admitting: Family Medicine

## 2021-10-24 DIAGNOSIS — K0889 Other specified disorders of teeth and supporting structures: Secondary | ICD-10-CM | POA: Diagnosis not present

## 2021-10-24 DIAGNOSIS — K0501 Acute gingivitis, non-plaque induced: Secondary | ICD-10-CM | POA: Diagnosis not present

## 2021-10-26 ENCOUNTER — Encounter: Payer: Self-pay | Admitting: Family

## 2021-11-01 ENCOUNTER — Emergency Department (HOSPITAL_BASED_OUTPATIENT_CLINIC_OR_DEPARTMENT_OTHER): Payer: Federal, State, Local not specified - PPO

## 2021-11-01 ENCOUNTER — Encounter (HOSPITAL_BASED_OUTPATIENT_CLINIC_OR_DEPARTMENT_OTHER): Payer: Self-pay | Admitting: Emergency Medicine

## 2021-11-01 ENCOUNTER — Other Ambulatory Visit: Payer: Self-pay

## 2021-11-01 ENCOUNTER — Emergency Department (HOSPITAL_BASED_OUTPATIENT_CLINIC_OR_DEPARTMENT_OTHER)
Admission: EM | Admit: 2021-11-01 | Discharge: 2021-11-01 | Disposition: A | Discharge status: Home / Self Care | Payer: Federal, State, Local not specified - PPO | Attending: Emergency Medicine | Admitting: Emergency Medicine

## 2021-11-01 DIAGNOSIS — Z3A01 Less than 8 weeks gestation of pregnancy: Secondary | ICD-10-CM | POA: Diagnosis not present

## 2021-11-01 DIAGNOSIS — O26891 Other specified pregnancy related conditions, first trimester: Secondary | ICD-10-CM | POA: Diagnosis not present

## 2021-11-01 DIAGNOSIS — R1084 Generalized abdominal pain: Secondary | ICD-10-CM | POA: Diagnosis not present

## 2021-11-01 DIAGNOSIS — O074 Failed attempted termination of pregnancy without complication: Secondary | ICD-10-CM | POA: Diagnosis not present

## 2021-11-01 DIAGNOSIS — Z3201 Encounter for pregnancy test, result positive: Secondary | ICD-10-CM | POA: Diagnosis not present

## 2021-11-01 DIAGNOSIS — O039 Complete or unspecified spontaneous abortion without complication: Secondary | ICD-10-CM | POA: Diagnosis not present

## 2021-11-01 DIAGNOSIS — N8311 Corpus luteum cyst of right ovary: Secondary | ICD-10-CM | POA: Diagnosis not present

## 2021-11-01 DIAGNOSIS — Z3490 Encounter for supervision of normal pregnancy, unspecified, unspecified trimester: Secondary | ICD-10-CM

## 2021-11-01 DIAGNOSIS — Z3A00 Weeks of gestation of pregnancy not specified: Secondary | ICD-10-CM | POA: Diagnosis not present

## 2021-11-01 DIAGNOSIS — R188 Other ascites: Secondary | ICD-10-CM | POA: Diagnosis not present

## 2021-11-01 DIAGNOSIS — Z332 Encounter for elective termination of pregnancy: Secondary | ICD-10-CM

## 2021-11-01 LAB — CBC
HCT: 39 % (ref 36.0–46.0)
Hemoglobin: 12.9 g/dL (ref 12.0–15.0)
MCH: 30.5 pg (ref 26.0–34.0)
MCHC: 33.1 g/dL (ref 30.0–36.0)
MCV: 92.2 fL (ref 80.0–100.0)
Platelets: 326 10*3/uL (ref 150–400)
RBC: 4.23 MIL/uL (ref 3.87–5.11)
RDW: 13.2 % (ref 11.5–15.5)
WBC: 6.3 10*3/uL (ref 4.0–10.5)
nRBC: 0 % (ref 0.0–0.2)

## 2021-11-01 LAB — COMPREHENSIVE METABOLIC PANEL
ALT: 9 U/L (ref 0–44)
AST: 12 U/L — ABNORMAL LOW (ref 15–41)
Albumin: 3.3 g/dL — ABNORMAL LOW (ref 3.5–5.0)
Alkaline Phosphatase: 42 U/L (ref 38–126)
Anion gap: 5 (ref 5–15)
BUN: 9 mg/dL (ref 6–20)
CO2: 26 mmol/L (ref 22–32)
Calcium: 8.9 mg/dL (ref 8.9–10.3)
Chloride: 105 mmol/L (ref 98–111)
Creatinine, Ser: 0.66 mg/dL (ref 0.44–1.00)
GFR, Estimated: >60 mL/min (ref >60)
Glucose, Bld: 102 mg/dL — ABNORMAL HIGH (ref 70–99)
Potassium: 3.9 mmol/L (ref 3.5–5.1)
Sodium: 136 mmol/L (ref 135–145)
Total Bilirubin: 0.5 mg/dL (ref 0.3–1.2)
Total Protein: 6.7 g/dL (ref 6.5–8.1)

## 2021-11-01 LAB — WET PREP, GENITAL
Clue Cells Wet Prep HPF POC: NONE SEEN
Sperm: NONE SEEN
WBC, Wet Prep HPF POC: <10 (ref <10)
Yeast Wet Prep HPF POC: NONE SEEN

## 2021-11-01 LAB — URINALYSIS, ROUTINE W REFLEX MICROSCOPIC
Bilirubin Urine: NEGATIVE
Glucose, UA: NEGATIVE mg/dL
Ketones, ur: NEGATIVE mg/dL
Nitrite: NEGATIVE
Protein, ur: 30 mg/dL — AB
Specific Gravity, Urine: >=1.03 (ref 1.005–1.030)
pH: 5.5 (ref 5.0–8.0)

## 2021-11-01 LAB — URINALYSIS, MICROSCOPIC (REFLEX): RBC / HPF: >50 RBC/hpf (ref 0–5)

## 2021-11-01 LAB — PREGNANCY, URINE: Preg Test, Ur: POSITIVE — AB

## 2021-11-01 LAB — HCG, QUANTITATIVE, PREGNANCY: hCG, Beta Chain, Quant, S: 4684 m[IU]/mL — ABNORMAL HIGH (ref <5)

## 2021-11-01 LAB — LIPASE, BLOOD: Lipase: 31 U/L (ref 11–51)

## 2021-11-01 MED ORDER — IBUPROFEN 400 MG PO TABS
600.0000 mg | ORAL_TABLET | Freq: Once | ORAL | Status: AC
  Administered 2021-11-01: 600 mg via ORAL
  Filled 2021-11-01: qty 1

## 2021-11-01 MED ORDER — METRONIDAZOLE 500 MG PO TABS: 500.0000 mg | ORAL_TABLET | Freq: Two times a day (BID) | ORAL | 0 refills | Status: DC

## 2021-11-01 MED ORDER — METRONIDAZOLE 500 MG PO TABS: 500.0000 mg | ORAL_TABLET | Freq: Two times a day (BID) | ORAL | 0 refills | Status: AC

## 2021-11-01 NOTE — ED Provider Notes (Signed)
[facility] EMERGENCY DEPARTMENT Provider Note   CSN: [id number] Arrival date & time: 11/01/21  1022     History  Chief Complaint  Patient presents with   Abdominal Pain    Kristen Santos is a 36 y.o. female.  36 yo woman presents with abdominal pain and bleeding. Patient is pregnant and was prescribed mifepristone/misoprostol which she took on 2/9, then the subsequent rx 24 hours later. She started having bleeding and cramping Friday night into Saturday. She has had one small blood clot, not going through more than a pad an hour of bleeding. Her bleeding tapered off and stopped last night, but she is having ongoing suprabpubic pain today. No dizziness, n/v/d.       Home Medications Prior to Admission medications   Medication Sig Start Date End Date Taking? Authorizing Provider  cyclobenzaprine (FLEXERIL) 5 MG tablet Take 1 tablet (5 mg total) by mouth at bedtime as needed for muscle spasms. Patient not taking: Reported on 05/02/2018 11/01/17   Caccavale, Sophia, PA-C  diclofenac sodium (VOLTAREN) 1 % GEL Apply 2 g topically 4 (four) times daily. Patient not taking: Reported on 05/02/2018 12/15/17   Avie Echevaria B, PA-C  doxycycline (VIBRAMYCIN) 100 MG capsule Take 1 capsule (100 mg total) by mouth 2 (two) times daily. 05/02/18   Nanavati, Ankit, MD  Flaxseed, Linseed, (FLAX SEED OIL PO) Take 1 capsule by mouth daily as needed.     [provider]  ibuprofen (ADVIL,MOTRIN) 800 MG tablet Take 1 tablet (800 mg total) by mouth 3 (three) times daily with meals. Patient not taking: Reported on 05/02/2018 11/01/17   Caccavale, Sophia, PA-C  lidocaine (LIDODERM) 5 % Place 1 patch onto the skin daily. Remove & Discard patch within 12 hours or as directed by MD Patient not taking: Reported on 05/02/2018 11/01/17   Caccavale, Sophia, PA-C  methocarbamol (ROBAXIN) 500 MG tablet Take 1 tablet (500 mg total) by mouth at bedtime as needed. Patient not taking: Reported on  05/02/2018 12/15/17   Emeline General, PA-C  Multiple Vitamin (MULTIVITAMIN) tablet Take 1 tablet by mouth daily as needed.    [provider]  Naphazoline HCl (CLEAR EYES OP) Place 1 drop into both eyes daily as needed (dry eyes).    [provider]  naproxen (NAPROSYN) 500 MG tablet Take 1 tablet (500 mg total) by mouth 2 (two) times daily. 07/05/21   Sherrill Raring, PA-C  nitrofurantoin, macrocrystal-monohydrate, (MACROBID) 100 MG capsule Take 1 capsule (100 mg total) by mouth 2 (two) times daily. 05/02/18   Varney Biles, MD  Norethindrone-Ethinyl Estradiol-Fe Biphas (LO LOESTRIN FE) 1 MG-10 MCG / 10 MCG tablet Take 1 tablet by mouth daily. Take 1 tablet by mouth daily. Patient not taking: Reported on 10/31/2017 01/10/17   Kandis Cocking A, CNM      Allergies    Penicillins    Review of Systems   Review of Systems  Constitutional:  Negative for chills and fever.  HENT:  Negative for congestion.   Respiratory:  Negative for cough.   Cardiovascular:  Negative for chest pain and palpitations.  Gastrointestinal:  Positive for abdominal pain. Negative for constipation, diarrhea, nausea, rectal pain and vomiting.  Genitourinary:  Positive for pelvic pain and vaginal bleeding. Negative for dysuria, flank pain, frequency, vaginal discharge and vaginal pain.  Skin:  Negative for pallor.  Neurological:  Negative for dizziness and light-headedness.  All other systems reviewed and are negative.  Physical Exam Updated Vital Signs BP 117/66 (BP  Location: Right Arm)    Pulse 64    Temp 98.7 F (37.1 C) (Oral)    Resp 18    SpO2 100%  Physical Exam Vitals and nursing note reviewed. Exam conducted with a chaperone present.  Constitutional:      General: She is not in acute distress.    Appearance: She is well-developed. She is obese. She is not ill-appearing, toxic-appearing or diaphoretic.  HENT:     Head: Normocephalic and atraumatic.  Cardiovascular:     Rate and Rhythm:  Normal rate and regular rhythm.     Heart sounds: Normal heart sounds.  Pulmonary:     Effort: Pulmonary effort is normal.     Breath sounds: Normal breath sounds.  Abdominal:     General: Abdomen is protuberant. A surgical scar is present. Bowel sounds are normal. There is no distension or abdominal bruit. There are no signs of injury.     Palpations: Abdomen is soft. There is no hepatomegaly, splenomegaly, mass or pulsatile mass.     Tenderness: There is abdominal tenderness in the suprapubic area. There is no right CVA tenderness, left CVA tenderness, guarding or rebound. Negative signs include McBurney's sign.     Hernia: No hernia is present.  Genitourinary:    Vagina: Bleeding present. No vaginal discharge, erythema or tenderness.     Cervix: No cervical motion tenderness, discharge or friability.     Adnexa: Right adnexa normal and left adnexa normal.  Skin:    General: Skin is warm and dry.     Capillary Refill: Capillary refill takes less than 2 seconds.  Neurological:     General: No focal deficit present.     Mental Status: She is alert and oriented to person, place, and time.  Psychiatric:        Mood and Affect: Mood normal.        Behavior: Behavior normal.    ED Results / Procedures / Treatments   Labs (all labs ordered are listed, but only abnormal results are displayed) Labs Reviewed  WET PREP, GENITAL - Abnormal; Notable for the following components:      Result Value   Trich, Wet Prep PRESENT (*)    All other components within normal limits  COMPREHENSIVE METABOLIC PANEL - Abnormal; Notable for the following components:   Glucose, Bld 102 (*)    Albumin 3.3 (*)    AST 12 (*)    All other components within normal limits  URINALYSIS, ROUTINE W REFLEX MICROSCOPIC - Abnormal; Notable for the following components:   APPearance CLOUDY (*)    Hgb urine dipstick LARGE (*)    Protein, ur 30 (*)    Leukocytes,Ua SMALL (*)    All other components within normal  limits  PREGNANCY, URINE - Abnormal; Notable for the following components:   Preg Test, Ur POSITIVE (*)    All other components within normal limits  URINALYSIS, MICROSCOPIC (REFLEX) - Abnormal; Notable for the following components:   Bacteria, UA MANY (*)    Trichomonas, UA PRESENT (*)    All other components within normal limits  HCG, QUANTITATIVE, PREGNANCY - Abnormal; Notable for the following components:   hCG, Beta Chain, Quant, S 4,684 (*)    All other components within normal limits  LIPASE, BLOOD  CBC  GC/CHLAMYDIA PROBE AMP ([place]) NOT AT [facility]    EKG None  Radiology US OB LESS THAN 14 WEEKS WITH OB TRANSVAGINAL  Result Date: 11/01/2021 CLINICAL DATA:  Positive pregnancy  test. EXAM: OBSTETRIC <14 WK [place] AND TRANSVAGINAL OB US TECHNIQUE: Both transabdominal and transvaginal ultrasound examinations were performed for complete evaluation of the gestation as well as the maternal uterus, adnexal regions, and pelvic cul-de-sac. Transvaginal technique was performed to assess early pregnancy. COMPARISON:  None. FINDINGS: Intrauterine gestational sac: None. Subchorionic hemorrhage:  None visualized. Maternal uterus/adnexae: Mild endometrial thickening without fluid in the endometrial cavity. 19 mm corpus luteum cyst noted right ovary. Dominant follicle or small cyst noted left ovary. No adnexal mass. Trace intraperitoneal free fluid. IMPRESSION: 1. No intrauterine gestational sac. Given the history of a positive pregnancy test, differential considerations include intrauterine gestation too early to visualize, completed abortion, or nonvisualized ectopic pregnancy. 2. Trace intraperitoneal free fluid. Electronically Signed   By: Misty Stanley M.D.   On: 11/01/2021 14:24    Procedures Procedures    Medications Ordered in ED Medications  ibuprofen (ADVIL) tablet 600 mg (has no administration in time range)    ED Course/ Medical Decision Making/ A&P Clinical Course as of 11/01/21  1450  Sun Nov 01, 2021  1341 US OB LESS THAN 14 WEEKS WITH OB TRANSVAGINAL [CM]  1431 Trichomonas +, will treat with flagyl.  [CM]  1443 Lipase, CBC, CMP unremarkable. Hematuria present, likely from vaginal bleeding.  [CM]    Clinical Course User Index [CM] Gladys Damme, MD                           Medical Decision Making 36 yo woman with positive pregnancy test, abdominal pain after medication induced abortion. Os is closed on exam. OB US shows nothing in the uterus. Discussed with on call OBGYN, Dr. Rip Harbour, who recommended 1 week follow up serum b-HCG. Trichomonas found on labs today; will treat with flagyl. Hgb stable and WNL. Will treat with ibuprofen for pain and discuss follow up as well as return precautions. Given reassuring abdominal exam, unlikely to be non ob/gyn source of pain.   Amount and/or Complexity of Data Reviewed Labs: ordered. Decision-making details documented in ED Course. Radiology: ordered. Decision-making details documented in ED Course.          Final Clinical Impression(s) / ED Diagnoses Final diagnoses:  Abortion in first trimester    Rx / DC Orders ED Discharge Orders     None         Gladys Damme, MD 11/01/21 1450    Gareth Morgan, MD 11/03/21 [phone]

## 2021-11-01 NOTE — ED Notes (Signed)
Patient transported to Ultrasound 

## 2021-11-01 NOTE — ED Triage Notes (Signed)
Pt reports she took abortion pill and medication on Thursday and Friday. Pt started bleeding but has not passed as much tissue as she expected along with having generalized abd pain. Pt estimates she was at about 6-[redacted] weeks gestation previously. Denies menorrhagia.

## 2021-11-01 NOTE — Discharge Instructions (Addendum)
For pain I recommend alternating tylenol and ibuprofen every 6 hours: for example, if you take ibuprofen at 9 AM, take tylenol at noon, ibuprofen at 3 PM, and tylenol at 6 PM, etc.   If you have bleeding that is more than 1 pad per hour or clots the size of a golf ball or larger, please come to the ED. If you have intolerable abdominal pain, nausea or vomiting, fever (temp over 100.4*F), please seek care immediately.  You will need a recheck of a blood pregnancy test in 1 week to ensure complete resolution of this pregnancy. I recommend doing this at the office you went to this week if they can offer that, or follow up with the [facility] by calling [phone].

## 2021-11-01 NOTE — ED Notes (Signed)
Will update vitals after pt is back from ultrasound.

## 2021-11-02 LAB — GC/CHLAMYDIA PROBE AMP (~~LOC~~) NOT AT ARMC
Chlamydia: NEGATIVE
Comment: NEGATIVE
Comment: NORMAL
Neisseria Gonorrhea: NEGATIVE

## 2021-11-05 ENCOUNTER — Other Ambulatory Visit: Payer: Self-pay

## 2021-11-05 ENCOUNTER — Encounter: Payer: Self-pay | Admitting: Family

## 2021-11-05 ENCOUNTER — Ambulatory Visit: Payer: Federal, State, Local not specified - PPO | Admitting: Family

## 2021-11-05 VITALS — BP 112/74 | HR 90 | Temp 98.7°F | Ht 65.0 in | Wt 233.4 lb

## 2021-11-05 DIAGNOSIS — R1030 Lower abdominal pain, unspecified: Secondary | ICD-10-CM

## 2021-11-05 DIAGNOSIS — Z332 Encounter for elective termination of pregnancy: Secondary | ICD-10-CM

## 2021-11-05 DIAGNOSIS — M545 Low back pain, unspecified: Secondary | ICD-10-CM | POA: Diagnosis not present

## 2021-11-05 MED ORDER — CYCLOBENZAPRINE HCL 10 MG PO TABS: 10.0000 mg | ORAL_TABLET | Freq: Three times a day (TID) | ORAL | 0 refills | Status: DC | PRN

## 2021-11-05 NOTE — Patient Instructions (Signed)
Welcome to [employer] [facility] [employer]! It was a pleasure meeting you today.  As discussed, go to the lab for blood work today. I have sent generic Flexeril to your pharmacy to take mainly at bedtime, but during work hours if does not cause drowsiness, or can cut pill in half. Continue the Ibuprofen 3 times per day as needed for pain after eating.  I will complete your FMLA forms in the next couple of days, by Monday you can pick up from the front desk.    PLEASE NOTE:  If you had any LAB tests please let us know if you have not heard back within a few days. You may see your results on MyChart before we have a chance to review them but we will give you a call once they are reviewed by [place]. If we ordered any REFERRALS today, please let us know if you have not heard from their office within the next week.  Let us know through MyChart if you are needing REFILLS, or have your pharmacy send [place] the request. You can also use MyChart to communicate with me or any office staff.  Please try these tips to maintain a healthy lifestyle:  Eat most of your calories during the day when you are active. Eliminate processed foods including packaged sweets (pies, cakes, cookies), reduce intake of potatoes, white bread, white pasta, and white rice. Look for whole grain options, oat flour or almond flour.  Each meal should contain half fruits/vegetables, one quarter protein, and one quarter carbs (no bigger than a computer mouse).  Cut down on sweet beverages. This includes juice, soda, and sweet tea. Also watch fruit intake, though this is a healthier sweet option, it still contains natural sugar! Limit to 3 servings daily.  Drink at least 1 glass of water with each meal and aim for at least 8 glasses per day  Exercise at least 150 minutes every week.

## 2021-11-05 NOTE — Progress Notes (Signed)
New Patient Office Visit  Subjective:  Patient ID: Kristen Santos, female    DOB: [birth year]  Age: 36 y.o. MRN: [id number]  CC:  Chief Complaint  Patient presents with   New Patient (Initial Visit)    Pt states she started an abortion process on 02/09 and she is having back pain and stomach pain from that.    HPI [name] presents for establishing care and to discuss a new problem.  Abdominal Pain: Patient complains of abdominal pain. The pain is described as colicky and cramping. Pain is located in the suprapubic without radiation. Onset was 3 weeks ago. Symptoms have been gradually improving since. Aggravating factors: activity.  Alleviating factors: NSAIDs. Associated symptoms: vaginal bleeding, improving. The patient denies chills, diarrhea, dysuria, fever, hematuria, nausea, and vomiting. Pain She reports new onset low back pain. There was not an injury that may have caused the pain. The pain started about a week ago and is staying constant. The pain does not radiate. The pain is described as aching and soreness, is moderate in intensity, occurring intermittently. Symptoms are worse in the: evening, nighttime  Aggravating factors: standing and walking She has tried application of heat and NSAIDs with mild relief.    Past Medical History:  Diagnosis Date   H/O influenza 10/25/2016   Ileus ([place])    Post-dates pregnancy 12/11/2016   Supervision of normal pregnancy, antepartum 06/06/2016     [facility] Prenatal Labs  Dating LMP c/w 10w sono Blood type: O/Positive/-- (09/19 1452) O pos  Genetic Screen Quad: negative      Antibody:Negative (09/19 1452)Neg  Anatomic [place]  suboptimal  Completed @36  wks;normal; female; EFW: 54% Rubella: 6.36 (09/19 1452)Immune  GTT Third trimester: 82/116/70 RPR: Non Reactive (12/12 1050)   Flu vaccine 06/08/2016 HBsAg: Negative (09/19 1452) neg  TDaP v   UTI (lower urinary tract infection)     Past Surgical History:  Procedure Laterality  Date   ABDOMINAL SURGERY      Family History  Problem Relation Age of Onset   Hypertension Mother    Kidney disease Father     Social History   Socioeconomic History   Marital status: Single    Spouse name: Not on file   Number of children: Not on file   Years of education: Not on file   Highest education level: Not on file  Occupational History   Not on file  Tobacco Use   Smoking status: Never   Smokeless tobacco: Never  Substance and Sexual Activity   Alcohol use: No   Drug use: Not Currently    Types: Marijuana    Comment: last used 05/2016   Sexual activity: Yes  Other Topics Concern   Not on file  Social History Narrative   Not on file   Social Determinants of Health   Financial Resource Strain: Not on file  Food Insecurity: Not on file  Transportation Needs: Not on file  Physical Activity: Not on file  Stress: Not on file  Social Connections: Not on file  Intimate Partner Violence: Not on file    Objective:   Today's Vitals: BP 112/74    Pulse 90    Temp 98.7 F (37.1 C)    Ht 5\' 5"  (1.651 m)    Wt 233 lb 6.4 oz (105.9 kg)    SpO2 99%    BMI 38.84 kg/m   Physical Exam Vitals and nursing note reviewed.  Constitutional:      Appearance: Normal appearance.  She is obese.  Cardiovascular:     Rate and Rhythm: Normal rate and regular rhythm.  Pulmonary:     Effort: Pulmonary effort is normal.     Breath sounds: Normal breath sounds.  Musculoskeletal:        General: Normal range of motion.  Skin:    General: Skin is warm and dry.  Neurological:     Mental Status: She is alert.  Psychiatric:        Mood and Affect: Mood normal.        Behavior: Behavior normal.    Assessment & Plan:   Problem List Items Addressed This Visit       Other   Abortion in first trimester - Primary    pt went to local health clinic for abortion on 2/9, given mifepristone/misoprostol, developed severe cramping and bleeding, seen in ER 3 days later, TVUS negative  for any gestational sac, also positive for Trichomonas and treated, given Ibuprofen for pain. Pt reports she is still having pain, but it has improved.       Relevant Orders   B-HCG Quant   Other Visit Diagnoses     Acute bilateral low back pain without sciatica       since having abortion on 2/9, r/t abdominal cramping, advised to continue RX Ibuprofen given in ER, also sending Flexeril, pt advised on use & SE.  Relevant Medications   cyclobenzaprine (FLEXERIL) 10 MG tablet   Lower abdominal pain      since having abortion on 2/9, cramping and bleeding, seen in ER several days ago and given RX Ibuprofen which is providing mild relief. Sending Flexeril and advised on use & SE.    Outpatient Encounter Medications as of 11/05/2021  Medication Sig   metroNIDAZOLE (FLAGYL) 500 MG tablet Take 1 tablet (500 mg total) by mouth 2 (two) times daily for 7 days.   Naphazoline HCl (CLEAR EYES OP) Place 1 drop into both eyes daily as needed (dry eyes).   [DISCONTINUED] cyclobenzaprine (FLEXERIL) 10 MG tablet Take 1-2 tablets (10-20 mg total) by mouth 3 (three) times daily as needed for muscle spasms. Take at bedtime to help sleep   cyclobenzaprine (FLEXERIL) 10 MG tablet Take 1-2 tablets (10-20 mg total) by mouth 3 (three) times daily as needed for muscle spasms. Take at bedtime to help sleep   ibuprofen (ADVIL,MOTRIN) 800 MG tablet Take 1 tablet (800 mg total) by mouth 3 (three) times daily with meals. (Patient not taking: Reported on 05/02/2018)   lidocaine (LIDODERM) 5 % Place 1 patch onto the skin daily. Remove & Discard patch within 12 hours or as directed by MD (Patient not taking: Reported on 05/02/2018)   naproxen (NAPROSYN) 500 MG tablet Take 1 tablet (500 mg total) by mouth 2 (two) times daily. (Patient not taking: Reported on 11/05/2021)   [DISCONTINUED] cyclobenzaprine (FLEXERIL) 5 MG tablet Take 1 tablet (5 mg total) by mouth at bedtime as needed for muscle spasms. (Patient not taking: Reported  on 05/02/2018)   [DISCONTINUED] diclofenac sodium (VOLTAREN) 1 % GEL Apply 2 g topically 4 (four) times daily. (Patient not taking: Reported on 05/02/2018)   [DISCONTINUED] doxycycline (VIBRAMYCIN) 100 MG capsule Take 1 capsule (100 mg total) by mouth 2 (two) times daily.   [DISCONTINUED] Flaxseed, Linseed, (FLAX SEED OIL PO) Take 1 capsule by mouth daily as needed.    [DISCONTINUED] methocarbamol (ROBAXIN) 500 MG tablet Take 1 tablet (500 mg total) by mouth at bedtime as needed. (Patient not taking:  Reported on 05/02/2018)   [DISCONTINUED] Multiple Vitamin (MULTIVITAMIN) tablet Take 1 tablet by mouth daily as needed.   [DISCONTINUED] nitrofurantoin, macrocrystal-monohydrate, (MACROBID) 100 MG capsule Take 1 capsule (100 mg total) by mouth 2 (two) times daily.   [DISCONTINUED] Norethindrone-Ethinyl Estradiol-Fe Biphas (LO LOESTRIN FE) 1 MG-10 MCG / 10 MCG tablet Take 1 tablet by mouth daily. Take 1 tablet by mouth daily. (Patient not taking: Reported on 10/31/2017)   No facility-administered encounter medications on file as of 11/05/2021.    Follow-up: Return for any future concerns.   Jeanie Sewer, NP

## 2021-11-05 NOTE — Assessment & Plan Note (Signed)
pt went to local health clinic for abortion on 2/9, given mifepristone/misoprostol, developed severe cramping and bleeding, seen in ER 3 days later, TVUS negative for any gestational sac, also positive for Trichomonas and treated, given Ibuprofen for pain. Pt reports she is still having pain, but it has improved.

## 2021-11-06 LAB — HCG, QUANTITATIVE, PREGNANCY: Quantitative HCG: 977.95 m[IU]/mL

## 2021-11-09 ENCOUNTER — Encounter: Payer: Self-pay | Admitting: Family

## 2021-11-11 ENCOUNTER — Ambulatory Visit: Payer: Federal, State, Local not specified - PPO | Admitting: Family

## 2021-11-12 ENCOUNTER — Ambulatory Visit: Payer: Federal, State, Local not specified - PPO | Admitting: Family

## 2021-11-16 ENCOUNTER — Other Ambulatory Visit: Payer: Self-pay | Admitting: Family

## 2021-11-16 DIAGNOSIS — M545 Low back pain, unspecified: Secondary | ICD-10-CM

## 2021-11-16 DIAGNOSIS — R1030 Lower abdominal pain, unspecified: Secondary | ICD-10-CM

## 2021-11-16 MED ORDER — CYCLOBENZAPRINE HCL 10 MG PO TABS: 10.0000 mg | ORAL_TABLET | Freq: Three times a day (TID) | ORAL | 0 refills | Status: DC | PRN

## 2021-12-06 DIAGNOSIS — R600 Localized edema: Secondary | ICD-10-CM | POA: Diagnosis not present

## 2021-12-06 DIAGNOSIS — S29011A Strain of muscle and tendon of front wall of thorax, initial encounter: Secondary | ICD-10-CM | POA: Diagnosis not present

## 2022-01-28 DIAGNOSIS — M5441 Lumbago with sciatica, right side: Secondary | ICD-10-CM | POA: Diagnosis not present

## 2022-01-28 DIAGNOSIS — R319 Hematuria, unspecified: Secondary | ICD-10-CM | POA: Diagnosis not present

## 2022-04-15 IMAGING — US US OB < 14 WEEKS - US OB TV
1 series · 14 of 28 positions shown · non-contrast
Comparison: None.

CLINICAL DATA: Positive pregnancy test.

EXAM:
OBSTETRIC <14 WK US AND TRANSVAGINAL OB US
TECHNIQUE: Both transabdominal and transvaginal ultrasound examinations were
performed for complete evaluation of the gestation as well as the
maternal uterus, adnexal regions, and pelvic cul-de-sac.
Transvaginal technique was performed to assess early pregnancy.

[Series 1: us ob < 14 weeks - us ob tv · 100 acquisitions, 14 frames shown]
[im 4/100]
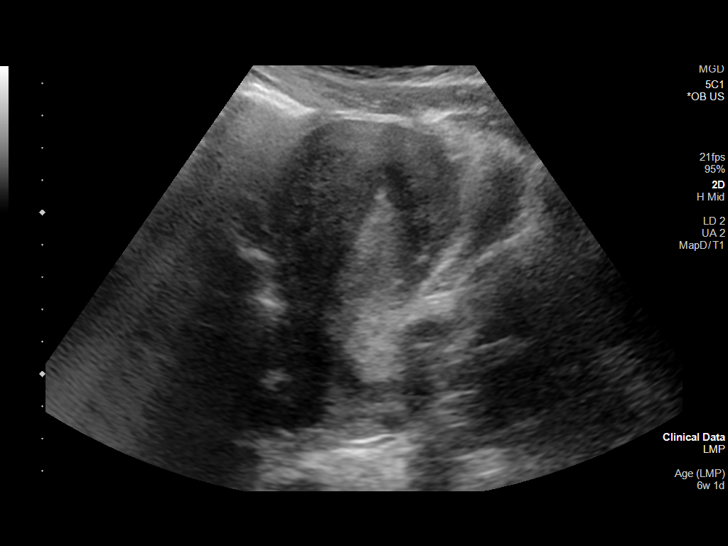
[im 12/100]
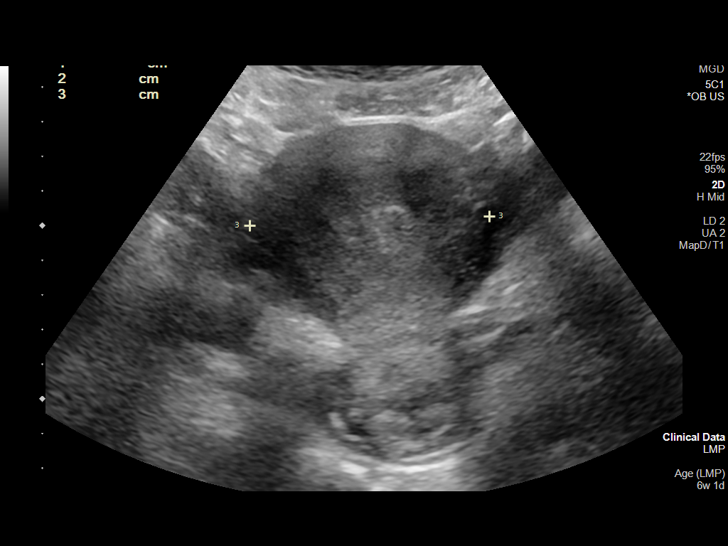
[im 19/100]
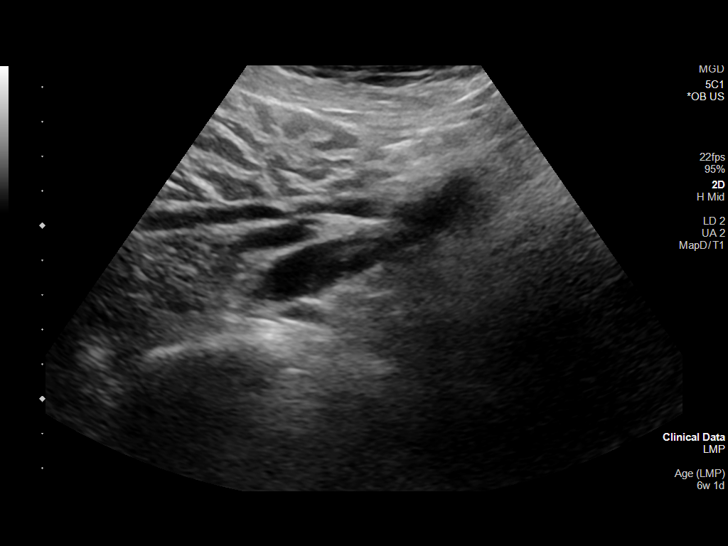
[im 26/100]
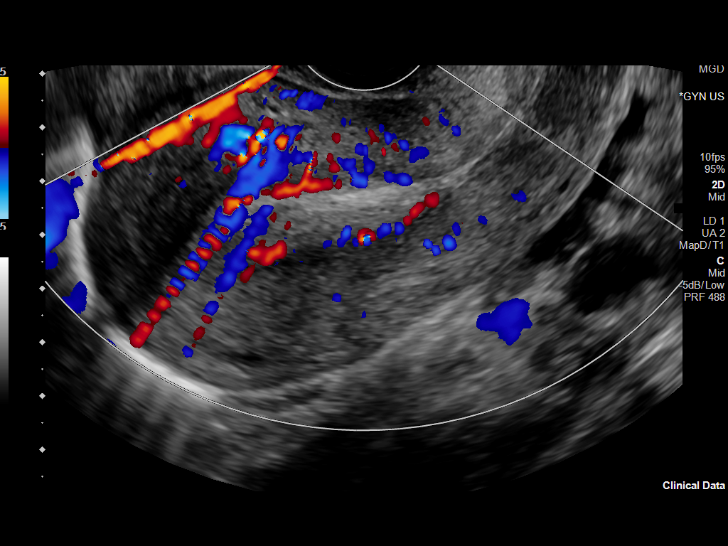
[im 34/100]
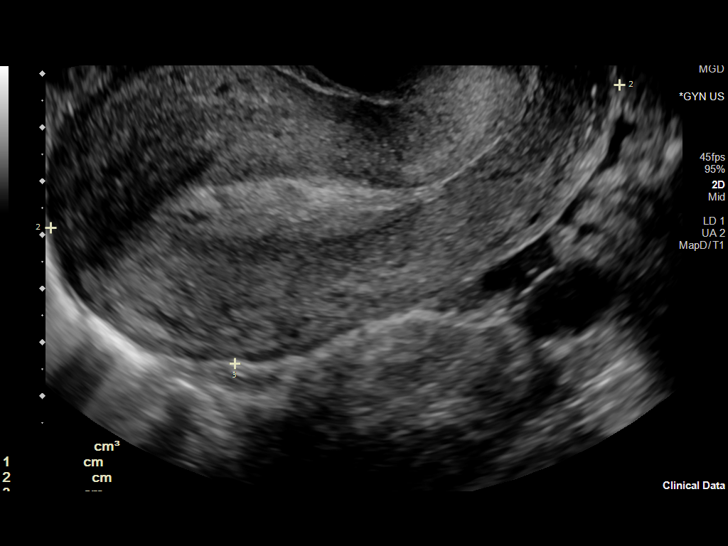
[im 41/100]
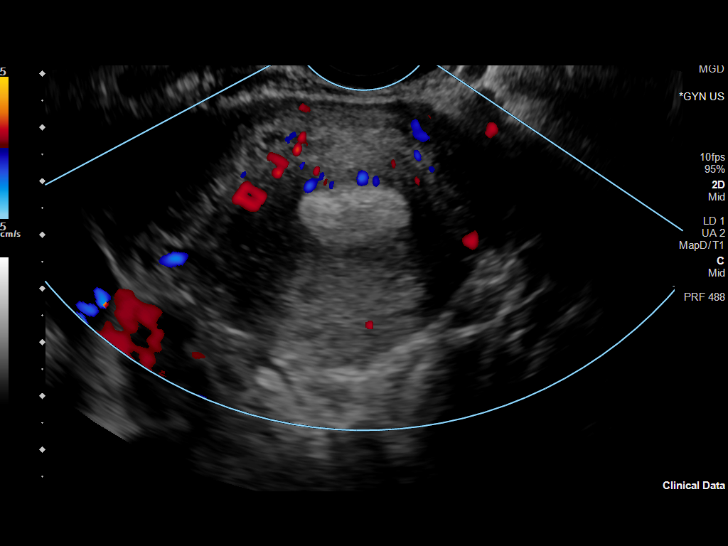
[im 48/100]
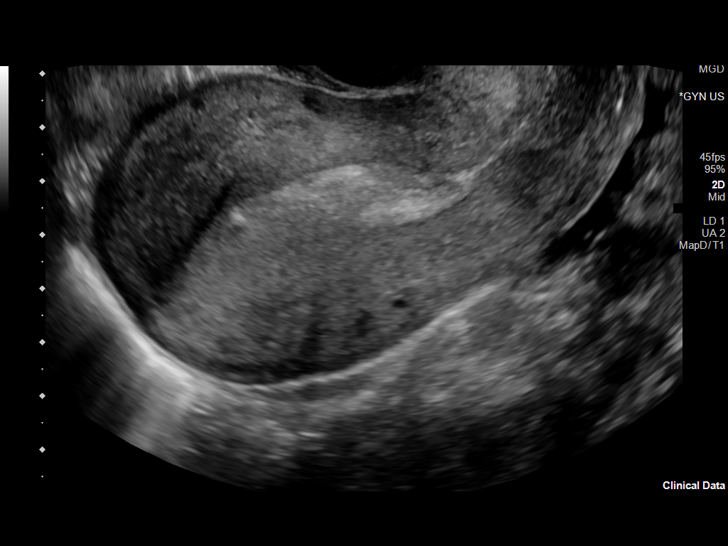
[im 56/100]
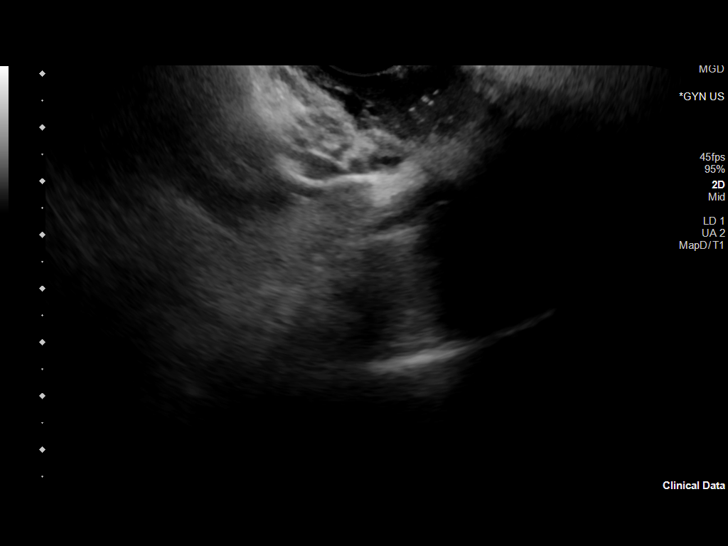
[im 63/100]
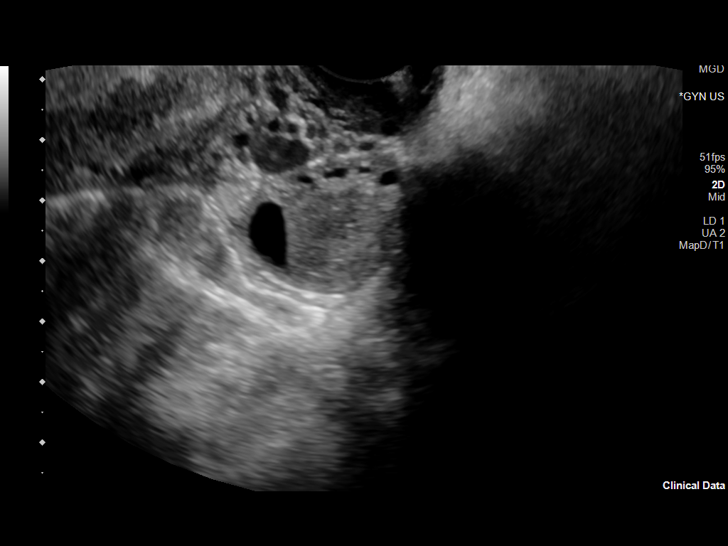
[im 70/100]
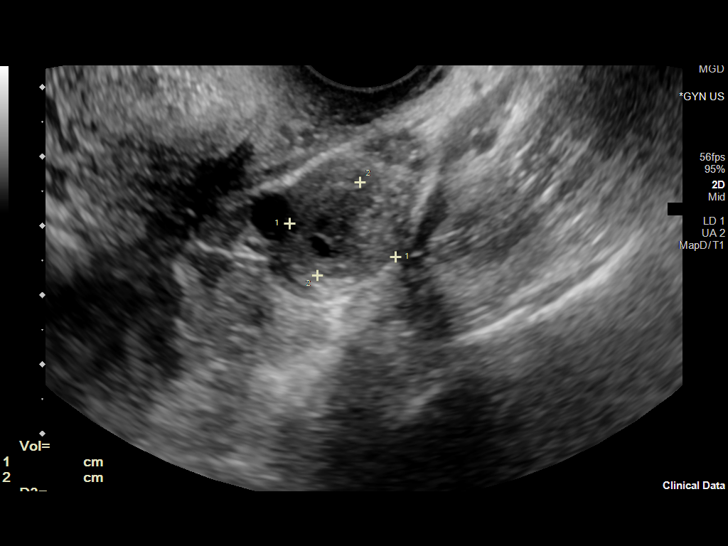
[im 78/100]
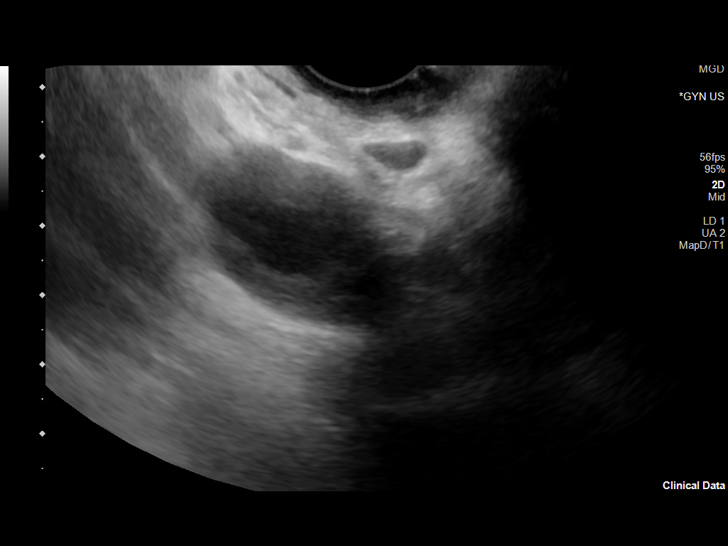
[im 85/100]
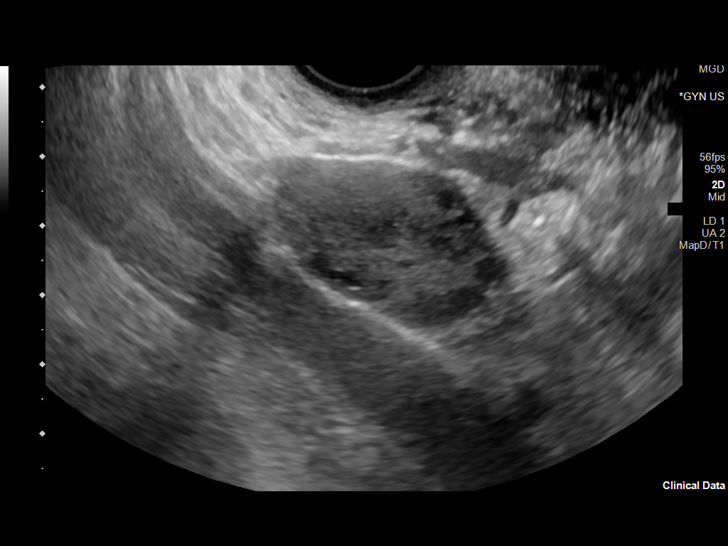
[im 92/100]
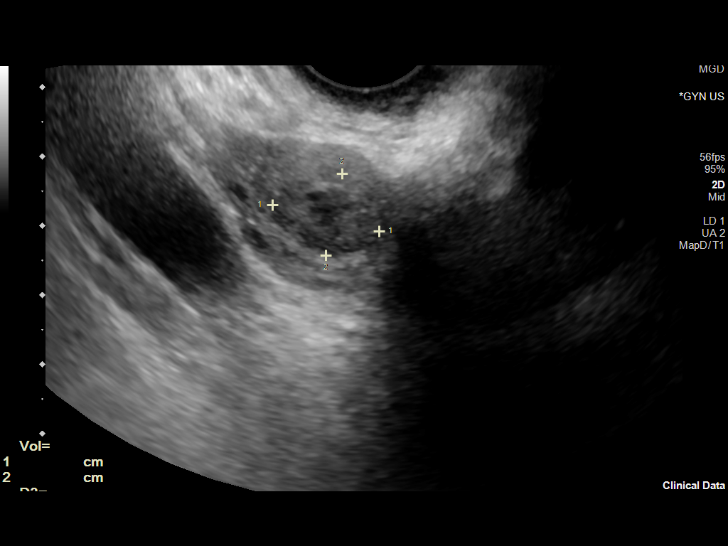
[im 100/100]
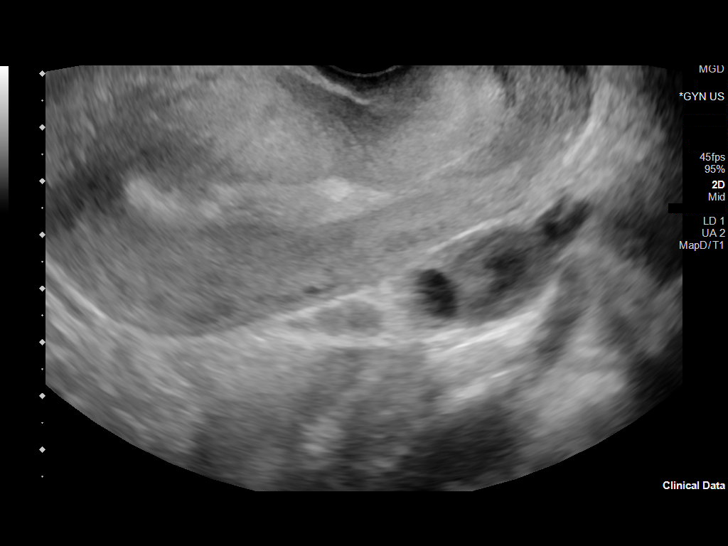

[14 of 28 positions shown; findings below may reference images not displayed]

FINDINGS: Intrauterine gestational sac: None.

Subchorionic hemorrhage:  None visualized.

Maternal uterus/adnexae: Mild endometrial thickening without fluid
in the endometrial cavity. 19 mm corpus luteum cyst noted right
ovary. Dominant follicle or small cyst noted left ovary. No adnexal
mass. Trace intraperitoneal free fluid.
IMPRESSION: 1. No intrauterine gestational sac. Given the history of a positive
pregnancy test, differential considerations include intrauterine
gestation too early to visualize, completed abortion, or
nonvisualized ectopic pregnancy.
2. Trace intraperitoneal free fluid.

## 2022-06-14 ENCOUNTER — Encounter: Payer: Self-pay | Admitting: *Deleted

## 2022-08-07 DIAGNOSIS — R0789 Other chest pain: Secondary | ICD-10-CM | POA: Diagnosis not present

## 2022-09-02 ENCOUNTER — Encounter: Payer: Self-pay | Admitting: *Deleted

## 2022-10-04 ENCOUNTER — Ambulatory Visit: Payer: Federal, State, Local not specified - PPO | Admitting: Family

## 2022-10-05 ENCOUNTER — Ambulatory Visit: Payer: Federal, State, Local not specified - PPO | Admitting: Family

## 2022-10-05 ENCOUNTER — Encounter: Payer: Self-pay | Admitting: Family

## 2022-10-05 ENCOUNTER — Telehealth: Payer: Self-pay | Admitting: Family

## 2022-10-05 VITALS — BP 96/52 | HR 72 | Temp 97.5°F | Ht 65.0 in | Wt 223.2 lb

## 2022-10-05 DIAGNOSIS — S6991XA Unspecified injury of right wrist, hand and finger(s), initial encounter: Secondary | ICD-10-CM

## 2022-10-05 DIAGNOSIS — F4321 Adjustment disorder with depressed mood: Secondary | ICD-10-CM

## 2022-10-05 DIAGNOSIS — F418 Other specified anxiety disorders: Secondary | ICD-10-CM | POA: Insufficient documentation

## 2022-10-05 MED ORDER — BUPROPION HCL ER (SR) 100 MG PO TB12: 100.0000 mg | ORAL_TABLET | Freq: Two times a day (BID) | ORAL | 0 refills | Status: DC

## 2022-10-05 NOTE — Patient Instructions (Signed)
It was very nice to see you today!   I have sent the Bupropion ([facility]rin) to your [facility]. Let me know after a few weeks how you are doing, if you increased to twice a day or if we may need to increase your dose. I will send a refill after we talk or message.  Use the Voltaren gel on your right hand up to 4 times per day as needed for pain, just rub in a small amount.       PLEASE NOTE:  If you had any lab tests please let us know if you have not heard back within a few days. You may see your results on MyChart before we have a chance to review them but we will give you a call once they are reviewed by [place]. If we ordered any referrals today, please let us know if you have not heard from their office within the next week.

## 2022-10-05 NOTE — Telephone Encounter (Signed)
Patient sent a my chart message stating:     "Hi I really didn't to bother you again Kristen Santos I just read the note for my job you have and she won't accept it only because it needs to state that I wasn't able to work instead of please excuse me. If you are able to can you send a new one stating that I wasn't able to perform my job due to taking care of my father's affairs and my health ?"  Please Advise.

## 2022-10-05 NOTE — Assessment & Plan Note (Signed)
New  r/t bad job situation and recent loss of her father discussed using her church family/pastors for support & counseling discussed different meds starting Wellbutrin 100mg  q12h, advised use & SE advised to message or call me in a few weeks with how she's doing, will need refill follow up 3 months

## 2022-10-05 NOTE — Progress Notes (Signed)
Patient ID: Kristen Santos, female    DOB: [birth year], 37 y.o.   MRN: [id number]  Chief Complaint  Patient presents with   Fatigue    d/t working 3rd shift & some depression   Hand Injury    Pt c/o right hand injury since Saturday. Hand is swollen. Has tried ice which did help the swelling a little.     HPI:      Fatigue/Depression:  Pt c/o fatigue, No appetite, unable to sleep  and coughing up mucus for about 3 weeks. Pt states she does not feel herself. father passed on 2023/09/05, Pt states she has not been eating well due to depression. States her job situation at the [employer] is not good either and compounding the problem. She had to take about 2 weeks off after her father died to take care of his affairs and needs a letter stating this was necessary as longer than usual bereavement.  Hand injury:  reports hurting her hand at work, describes as a twisting motion, had immediate swelling, no bruising or redness, denies any broken skin. States she can move her fingers ok, and some pain with moving her hand around. She applied ice one time after the injury occurred.   Assessment & Plan:   Problem List Items Addressed This Visit       Other   Situational depression - Primary    New  r/t bad job situation and recent loss of her father discussed using her church family/pastors for support & counseling discussed different meds starting Wellbutrin 100mg  q12h, advised use & SE advised to message or call me in a few weeks with how she's doing, will need refill follow up 3 months      Relevant Medications   buPROPion ER (WELLBUTRIN SR) 100 MG 12 hr tablet   Other Visit Diagnoses     Hand injury, right, initial encounter    - pt hurt at work, mild swelling noted, pt states it is improved, no bruising or redness noted, pt has full ROM with soreness. Advised ok to continue icing for up to 109min prn, provided sample of Voltaren gel, apply pea size drop and rub in well qid prn.     Subjective:    Outpatient Medications Prior to Visit  Medication Sig Dispense Refill   Naphazoline HCl (CLEAR EYES OP) Place 1 drop into both eyes daily as needed (dry eyes).     cyclobenzaprine (FLEXERIL) 10 MG tablet Take 1-2 tablets (10-20 mg total) by mouth 3 (three) times daily as needed for muscle spasms. (Patient not taking: Reported on 10/05/2022) 14 tablet 0   No facility-administered medications prior to visit.   Past Medical History:  Diagnosis Date   H/O influenza 10/25/2016   Ileus ([place])    Post-dates pregnancy 12/11/2016   Supervision of normal pregnancy, antepartum 06/06/2016     Clinic [facility] Prenatal Labs  Dating LMP c/w 10w sono Blood type: O/Positive/-- (09/19 1452) O pos  Genetic Screen Quad: negative      Antibody:Negative (09/19 1452)Neg  Anatomic [place]  suboptimal  Completed @36  wks;normal; female; EFW: 54% Rubella: 6.36 (09/19 1452)Immune  GTT Third trimester: 82/116/70 RPR: Non Reactive (12/12 1050)   Flu vaccine 06/08/2016 HBsAg: Negative (09/19 1452) neg  TDaP v   Trichomonal vaginitis during pregnancy in third trimester 11/29/2016   @38  weeks.     UTI (lower urinary tract infection)    Past Surgical History:  Procedure Laterality Date   ABDOMINAL SURGERY  Allergies  Allergen Reactions   Penicillins Shortness Of Breath, Itching and Other (See Comments)    Has patient had a PCN reaction causing immediate rash, facial/tongue/throat swelling, SOB or lightheadedness with hypotension: Yes Has patient had a PCN reaction causing severe rash involving mucus membranes or skin necrosis: No Has patient had a PCN reaction that required hospitalization No Has patient had a PCN reaction occurring within the last 10 years: Yes If all of the above answers are "NO", then may proceed with Cephalosporin use.      Objective:    Physical Exam Vitals and nursing note reviewed.  Constitutional:      Appearance: Normal appearance. She is obese.  Cardiovascular:      Rate and Rhythm: Normal rate and regular rhythm.  Pulmonary:     Effort: Pulmonary effort is normal.     Breath sounds: Normal breath sounds.  Musculoskeletal:        General: Normal range of motion.  Skin:    General: Skin is warm and dry.  Neurological:     Mental Status: She is alert.  Psychiatric:        Mood and Affect: Mood normal.        Behavior: Behavior normal.   BP (!) 96/52 (BP Location: Left Arm, Patient Position: Sitting, Cuff Size: Large)   Pulse 72   Temp (!) 97.5 F (36.4 C) (Temporal)   Ht 5\' 5"  (1.651 m)   Wt 223 lb 4 oz (101.3 kg)   LMP 10/02/2022 (Exact Date)   SpO2 98%   BMI 37.15 kg/m  Wt Readings from Last 3 Encounters:  10/05/22 223 lb 4 oz (101.3 kg)  11/05/21 233 lb 6.4 oz (105.9 kg)  04/11/18 190 lb (86.2 kg)       Jeanie Sewer, NP

## 2022-10-06 ENCOUNTER — Telehealth: Payer: Self-pay | Admitting: Family

## 2022-10-06 ENCOUNTER — Other Ambulatory Visit: Payer: Self-pay | Admitting: Family

## 2022-10-06 NOTE — Telephone Encounter (Signed)
Pt sent another msg to [facility]. She would like an update on previous msg.

## 2022-10-06 NOTE — Telephone Encounter (Signed)
can you print the note I wrote please

## 2022-10-06 NOTE — Telephone Encounter (Signed)
Pt has cancelled her TOC through mychart.

## 2022-10-06 NOTE — Telephone Encounter (Signed)
This message was sent in error. Please do not transfer!

## 2022-10-06 NOTE — Telephone Encounter (Signed)
Pt has scheduled via mychart a new patient with Dr. Grandville Silos and is currently a patient of NP Stephanie Hudnell. I have left her a vm to cb to figure out why.

## 2022-10-12 ENCOUNTER — Ambulatory Visit: Payer: Federal, State, Local not specified - PPO | Admitting: Family Medicine

## 2022-11-01 ENCOUNTER — Encounter: Payer: Self-pay | Admitting: Family

## 2022-11-01 ENCOUNTER — Ambulatory Visit: Payer: Federal, State, Local not specified - PPO | Admitting: Family

## 2022-11-01 VITALS — BP 109/71 | HR 72 | Temp 97.5°F | Ht 65.0 in | Wt 225.5 lb

## 2022-11-01 DIAGNOSIS — F418 Other specified anxiety disorders: Secondary | ICD-10-CM

## 2022-11-01 DIAGNOSIS — F4321 Adjustment disorder with depressed mood: Secondary | ICD-10-CM | POA: Insufficient documentation

## 2022-11-01 DIAGNOSIS — M25471 Effusion, right ankle: Secondary | ICD-10-CM

## 2022-11-01 DIAGNOSIS — M7989 Other specified soft tissue disorders: Secondary | ICD-10-CM | POA: Insufficient documentation

## 2022-11-01 MED ORDER — HYDROCHLOROTHIAZIDE 12.5 MG PO TABS: 12.5000 mg | ORAL_TABLET | Freq: Every morning | ORAL | 2 refills | Status: DC

## 2022-11-01 NOTE — Progress Notes (Signed)
Patient ID: Kristen Santos, female    DOB: 29-Mar-1986, 37 y.o.   MRN: [id number]  Chief Complaint  Patient presents with   Ankle Pain    Pt c/o right ankle pain and swelling, Present for 2 days. Has tried elevating and ice which did not help. Standing a lot at work.     HPI:      Depression f/u:  first seen a month ago d/t sx, pt still grieving loss of her father in Dec and dealing with a stressful job at [employer]. Started Bupropion 132m bid, but pt did not start this. Still having sx.  Right ankle pain/swelling:  has had off and on for a few days. States she elevates overnight but still has swelling in am. Reports her job requires her to stand for long periods with little walking. She was unable to work last night due to pain.      Assessment & Plan:   Problem List Items Addressed This Visit       Other   Situational anxiety    New seen 1 month ago r/t bad job situation and recent loss of her father started Wellbutrin [id number], pt did not pick up yet, states she will pick up today follow up 3 months      Ankle swelling, right - Primary    New advised on walking more when able at work look for mild compression socks, wear during day, off at night eat low sodium diet, limit take-out food increase water to 2L qd sending HCTZ 12.22m28madvised on use & SE, take for 1 week, ok to then take qod or prn, if able to implement above changes f/u 1 month or prn      Relevant Medications   hydrochlorothiazide (HYDRODIURIL) 12.5 MG tablet   Subjective:    Outpatient Medications Prior to Visit  Medication Sig Dispense Refill   buPROPion ER (WELLBUTRIN SR) 100 MG 12 hr tablet Take 1 tablet (100 mg total) by mouth 2 (two) times daily. Start with one pill in the morning for 3-4 days, then increase to twice a day. 60 tablet 0   No facility-administered medications prior to visit.   Past Medical History:  Diagnosis Date   Chlamydia trachomatis infection in mother during  third trimester of pregnancy 05/18/2016   TOC 9/19- neg  TOC 10/25/16: positive @34$  wks   H/O influenza 10/25/2016   [place]  Post-dates pregnancy 12/11/2016   Screening for STD (sexually transmitted disease) 12/09/2016   Supervision of normal pregnancy, antepartum 06/06/2016     [facility]  Dating LMP c/w 10w sono Blood type: O/Positive/-- (09/19 1452) O pos  Genetic Screen Quad: negative      Antibody:Negative (09/19 1452)Neg  Anatomic [place]  Completed @36$  wks;normal; female; EFW: 54% Rubella: 6.36 (09/19 1452)Immune  GTT Third trimester: 82/116/70 RPR: Non Reactive (12/12 1050)   Flu vaccine 06/08/2016 HBsAg: Negative (09/19 1452) neg  TDaP v   Trichomonal vaginitis during pregnancy in third trimester 11/29/2016   @38$  weeks.     UTI (lower urinary tract infection)    Past Surgical History:  Procedure Laterality Date   ABDOMINAL SURGERY     Allergies  Allergen Reactions   Penicillins Shortness Of Breath, Itching and Other (See Comments)    Has patient had a PCN reaction causing immediate rash, facial/tongue/throat swelling, SOB or lightheadedness with hypotension: Yes Has patient had a PCN reaction causing severe rash involving mucus membranes  or skin necrosis: No Has patient had a PCN reaction that required hospitalization No Has patient had a PCN reaction occurring within the last 10 years: Yes If all of the above answers are "NO", then may proceed with Cephalosporin use.      Objective:    Physical Exam Vitals and nursing note reviewed.  Constitutional:      Appearance: Normal appearance.  Cardiovascular:     Rate and Rhythm: Normal rate and regular rhythm.  Pulmonary:     Effort: Pulmonary effort is normal.     Breath sounds: Normal breath sounds.  Musculoskeletal:        General: Normal range of motion.     Right lower leg: 3+ Edema present.     Left lower leg: No edema.  Skin:    General: Skin is warm and dry.  Neurological:     Mental  Status: She is alert.  Psychiatric:        Mood and Affect: Mood normal.        Behavior: Behavior normal.    BP 109/71 (BP Location: Left Arm, Patient Position: Sitting, Cuff Size: Large)   Pulse 72   Temp (!) 97.5 F (36.4 C) (Temporal)   Ht 5' 5"$  (1.651 m)   Wt 225 lb 8 oz (102.3 kg)   LMP 10/08/2022 (Approximate)   SpO2 99%   BMI 37.53 kg/m  Wt Readings from Last 3 Encounters:  11/01/22 225 lb 8 oz (102.3 kg)  10/05/22 223 lb 4 oz (101.3 kg)  11/05/21 233 lb 6.4 oz (105.9 kg)       Jeanie Sewer, NP

## 2022-11-01 NOTE — Assessment & Plan Note (Signed)
New advised on walking more when able at work look for mild compression socks, wear during day, off at night eat low sodium diet, limit take-out food increase water to 2L qd sending HCTZ 12.39m, advised on use & SE, take for 1 week, ok to then take qod or prn, if able to implement above changes f/u 1 month or prn

## 2022-11-01 NOTE — Patient Instructions (Signed)
It was very nice to see you today!    I have sent Hydrochlorothiazide (HCTZ) to your pharmacy to help the swelling in your ankle & foot. Take every morning. This can cause you to be dehydrated because it makes you urinate out the extra fluid on your body, so VERY important to be sure to drink at least 8 cups of water daily.  Eat a low sodium diet every day - reduce any fast food or restaurant food.  Schedule a 1 month follow up visit today.    PLEASE NOTE:  If you had any lab tests please let us know if you have not heard back within a few days. You may see your results on MyChart before we have a chance to review them but we will give you a call once they are reviewed by [place]. If we ordered any referrals today, please let us know if you have not heard from their office within the next week.

## 2022-11-01 NOTE — Assessment & Plan Note (Signed)
New seen 1 month ago r/t bad job situation and recent loss of her father started Wellbutrin 125m q12h, pt did not pick up yet, states she will pick up today follow up 3 months

## 2022-11-08 DIAGNOSIS — R519 Headache, unspecified: Secondary | ICD-10-CM | POA: Diagnosis not present

## 2022-11-08 DIAGNOSIS — R067 Sneezing: Secondary | ICD-10-CM | POA: Diagnosis not present

## 2022-11-08 DIAGNOSIS — M791 Myalgia, unspecified site: Secondary | ICD-10-CM | POA: Diagnosis not present

## 2022-11-08 DIAGNOSIS — R0981 Nasal congestion: Secondary | ICD-10-CM | POA: Diagnosis not present

## 2022-11-15 ENCOUNTER — Encounter (HOSPITAL_COMMUNITY): Payer: Self-pay | Admitting: Emergency Medicine

## 2022-11-15 ENCOUNTER — Emergency Department (HOSPITAL_COMMUNITY)
Admission: EM | Admit: 2022-11-15 | Discharge: 2022-11-16 | Disposition: A | Discharge status: Home / Self Care | Payer: Federal, State, Local not specified - PPO | Attending: Emergency Medicine | Admitting: Emergency Medicine

## 2022-11-15 ENCOUNTER — Other Ambulatory Visit: Payer: Self-pay

## 2022-11-15 DIAGNOSIS — O98511 Other viral diseases complicating pregnancy, first trimester: Secondary | ICD-10-CM | POA: Diagnosis not present

## 2022-11-15 DIAGNOSIS — B349 Viral infection, unspecified: Secondary | ICD-10-CM | POA: Insufficient documentation

## 2022-11-15 DIAGNOSIS — O98519 Other viral diseases complicating pregnancy, unspecified trimester: Secondary | ICD-10-CM | POA: Insufficient documentation

## 2022-11-15 DIAGNOSIS — Z1152 Encounter for screening for COVID-19: Secondary | ICD-10-CM | POA: Diagnosis not present

## 2022-11-15 DIAGNOSIS — Z3490 Encounter for supervision of normal pregnancy, unspecified, unspecified trimester: Secondary | ICD-10-CM

## 2022-11-15 DIAGNOSIS — Z3A01 Less than 8 weeks gestation of pregnancy: Secondary | ICD-10-CM | POA: Diagnosis not present

## 2022-11-15 LAB — RESP PANEL BY RT-PCR (RSV, FLU A&B, COVID)  RVPGX2
Influenza A by PCR: NEGATIVE
Influenza B by PCR: NEGATIVE
Resp Syncytial Virus by PCR: NEGATIVE
SARS Coronavirus 2 by RT PCR: NEGATIVE

## 2022-11-15 MED ORDER — ONDANSETRON 4 MG PO TBDP
4.0000 mg | ORAL_TABLET | Freq: Once | ORAL | Status: AC
  Administered 2022-11-15: 4 mg via ORAL
  Filled 2022-11-15: qty 1

## 2022-11-15 NOTE — ED Triage Notes (Signed)
Pt states she has had a fever,chills and nausea X3 days.

## 2022-11-15 NOTE — ED Provider Triage Note (Signed)
Emergency Medicine Provider Triage Evaluation Note  Kristen Santos , a 37 y.o. female  was evaluated in triage.  Pt complains of vomiting onset tonight. Has associated chills, rhinorrhea, nasal congestion. Sick contacts at home with similar symptoms. No abdominal pain, chest pain, shortness of breath, urinary symptoms.   Review of Systems  Positive:  Negative:   Physical Exam  BP 117/65 (BP Location: Right Arm)   Pulse 71   Temp 98.1 F (36.7 C)   Resp 20   LMP 10/08/2022 (Approximate)   SpO2 97%  Gen:   Awake, no distress   Resp:  Normal effort  MSK:   Moves extremities without difficulty  Other:  No abdominal TTP.   Medical Decision Making  Medically screening exam initiated at 9:42 PM.  Appropriate orders placed.  Kristen Santos was informed that the remainder of the evaluation will be completed by another provider, this initial triage assessment does not replace that evaluation, and the importance of remaining in the ED until their evaluation is complete.  Work-up initiated.    Kristen Santos A, PA-C 11/15/22 2147

## 2022-11-16 ENCOUNTER — Telehealth: Payer: Self-pay

## 2022-11-16 LAB — PREGNANCY, URINE: Preg Test, Ur: POSITIVE — AB

## 2022-11-16 NOTE — ED Provider Notes (Signed)
[facility] Provider Note   CSN: [id number] Arrival date & time: 11/15/22  2116     History  Chief Complaint  Patient presents with   Fever   Emesis    Kristen Santos is a 37 y.o. female who present with concern for 1 week of nasal congestion, fatigue, runny nose, bodyaches, now with N/V wiiht NBNB emesis that started today. + home pregnancy test, known ill contacts as well. LMP 10/08/22.   Patient is G3P2, hx of prior termination with 2 living children at this time.  HPI     Home Medications Prior to Admission medications   Medication Sig Start Date End Date Taking? Authorizing Provider  buPROPion ER (WELLBUTRIN SR) 100 MG 12 hr tablet Take 1 tablet (100 mg total) by mouth 2 (two) times daily. Start with one pill in the morning for 3-4 days, then increase to twice a day. Patient not taking: Reported on 11/15/2022 10/05/22   Jeanie Sewer, NP  hydrochlorothiazide (HYDRODIURIL) 12.5 MG tablet Take 1 tablet (12.5 mg total) by mouth in the morning. OK to cut in half if dizzy. Patient not taking: Reported on 11/15/2022 11/01/22   Jeanie Sewer, NP      Allergies    Penicillins    Review of Systems   Review of Systems  Constitutional:  Positive for appetite change, chills and fatigue. Negative for activity change and fever.  HENT:  Positive for congestion. Negative for sore throat.   Respiratory:  Positive for cough.   Gastrointestinal:  Positive for nausea and vomiting. Negative for abdominal pain, constipation and diarrhea.  Genitourinary: Negative.  Negative for decreased urine volume, flank pain, hematuria, urgency, vaginal bleeding, vaginal discharge and vaginal pain.  Musculoskeletal:  Positive for myalgias.  Skin: Negative.   Neurological: Negative.     Physical Exam Updated Vital Signs BP 112/60   Pulse 65   Temp 97.9 F (36.6 C) (Oral)   Resp 15   Ht '5\' 5"'$  (1.651 m)   Wt 99.8 kg   LMP 10/08/2022  (Approximate)   SpO2 100%   BMI 36.61 kg/m  Physical Exam Vitals and nursing note reviewed.  Constitutional:      Appearance: She is not ill-appearing.  HENT:     Head: Normocephalic and atraumatic.     Nose: Congestion and rhinorrhea present. Rhinorrhea is clear.     Mouth/Throat:     Mouth: Mucous membranes are moist.     Pharynx: Oropharynx is clear. Uvula midline. No oropharyngeal exudate or posterior oropharyngeal erythema.     Tonsils: No tonsillar exudate.  Eyes:     General:        Right eye: No discharge.        Left eye: No discharge.     Extraocular Movements: Extraocular movements intact.     Conjunctiva/sclera: Conjunctivae normal.     Pupils: Pupils are equal, round, and reactive to light.  Neck:     Trachea: Trachea and phonation normal.  Cardiovascular:     Rate and Rhythm: Normal rate and regular rhythm.     Pulses: Normal pulses.     Heart sounds: Normal heart sounds. No murmur heard. Pulmonary:     Effort: Pulmonary effort is normal. No tachypnea, bradypnea, accessory muscle usage, prolonged expiration or respiratory distress.     Breath sounds: Normal breath sounds. No wheezing or rales.  Abdominal:     General: Bowel sounds are normal. There is no distension.  Palpations: Abdomen is soft.     Tenderness: There is no abdominal tenderness. There is no left CVA tenderness, guarding or rebound.  Musculoskeletal:        General: No deformity.     Cervical back: Normal range of motion and neck supple.     Right lower leg: No edema.     Left lower leg: No edema.  Lymphadenopathy:     Cervical: No cervical adenopathy.  Skin:    General: Skin is warm and dry.     Capillary Refill: Capillary refill takes less than 2 seconds.  Neurological:     General: No focal deficit present.     Mental Status: She is alert and oriented to person, place, and time. Mental status is at baseline.  Psychiatric:        Mood and Affect: Mood normal.     ED Results /  Procedures / Treatments   Labs (all labs ordered are listed, but only abnormal results are displayed) Labs Reviewed  PREGNANCY, URINE - Abnormal; Notable for the following components:      Result Value   Preg Test, Ur POSITIVE (*)    All other components within normal limits  RESP PANEL BY RT-PCR (RSV, FLU A&B, COVID)  [id number]  POC URINE PREG, ED    EKG None  Radiology No results found.  Procedures Procedures    Medications Ordered in ED Medications  ondansetron (ZOFRAN-ODT) disintegrating tablet 4 mg (4 mg Oral Given 11/15/22 2154)    ED Course/ Medical Decision Making/ A&P Clinical Course as of 11/16/22 0230  Tue Nov 16, 2022  0127 POC Urine Pregnancy, ED (not at [facility] or [facility]) [RS]    Clinical Course User Index [RS] [name], Gypsy Balsam, PA-C                             Medical Decision Making 37 year old female with flulike symptoms with nausea and vomiting.  Normal vital signs on intake.  Cardiopulmonary and abdominal exams are benign.  Patient without CVA tenderness.  Nasal congestion and rhinorrhea, nontoxic-appearing.  Amount and/or Complexity of Data Reviewed Labs: ordered. Decision-making details documented in ED Course.    Details: Upreg + RVP negative.    Patient with new diagnosis of pregnancy today, LMP 5 weeks ago.  Suspect concomitant viral illness causing URI symptoms, nausea and vomiting likely contributed to by pregnancy as well.  Patient without any urinary symptoms, abdominal pain, or vaginal bleeding/discharge, do not feel any further workup is warranted in ED at this time.  Will provide information for OB/GYN follow-up.  Patient does have primary care provider with whom she will follow-up as well.  Recommend Unisom and B6 as first-line therapy for nausea and vomiting in pregnancy.  Recommend increase hydration Tylenol for any documented fevers.  Advised to avoid NSAIDs.  No further workup warranted in ED at this time.  Clinical concern for emergent  underlying etiology for this patient symptoms that warrant further ED workup or inpatient management is exceedingly low.  Tiara voiced understanding of her medical evaluation and treatment plan. Each of their questions answered to their expressed satisfaction.  Return precautions were given.  Patient is well-appearing, stable, and was discharged in good condition.  This chart was dictated using voice recognition software, Dragon. Despite the best efforts of this provider to proofread and correct errors, errors may still occur which can change documentation meaning. Final Clinical Impression(s) / ED Diagnoses Final diagnoses:  Pregnancy, unspecified gestational age  Viral syndrome    Rx / DC Orders ED Discharge Orders     None         Aura Dials 11/16/22 0230    Ripley Fraise, MD 11/16/22 [phone]

## 2022-11-16 NOTE — Telephone Encounter (Signed)
LMOVM advising patient to return my call in regards to recent ER visit.

## 2022-11-16 NOTE — Discharge Instructions (Addendum)
You were seen in the ER today for your nausea, vomiting, and cold symptoms.  Your COVID and flu test are negative.  Suspect you have a different viral illness contributing to your cold symptoms.  Additionally you do have a positive pregnancy test today which may be contributing to the nausea you have been experiencing.  Please follow-up with your OB/GYN or with the [facility] listed below.  Please call their office first thing tomorrow morning to schedule follow-up appointment.  Take over-the-counter Unisom and vitamin B6 for nausea and vomiting.  Please make sure you are drinking plenty of fluids and return to the ER with any severe symptoms.

## 2022-11-25 ENCOUNTER — Ambulatory Visit: Payer: Federal, State, Local not specified - PPO | Admitting: Family

## 2022-11-25 ENCOUNTER — Encounter: Payer: Self-pay | Admitting: Family

## 2022-11-25 VITALS — BP 110/70 | HR 77 | Temp 97.7°F | Resp 16 | Wt 222.4 lb

## 2022-11-25 DIAGNOSIS — O219 Vomiting of pregnancy, unspecified: Secondary | ICD-10-CM | POA: Diagnosis not present

## 2022-11-25 DIAGNOSIS — Z3A01 Less than 8 weeks gestation of pregnancy: Secondary | ICD-10-CM

## 2022-11-25 DIAGNOSIS — Z3491 Encounter for supervision of normal pregnancy, unspecified, first trimester: Secondary | ICD-10-CM

## 2022-11-25 MED ORDER — ONDANSETRON HCL 4 MG PO TABS: 4.0000 mg | ORAL_TABLET | Freq: Three times a day (TID) | ORAL | 0 refills | Status: DC | PRN

## 2022-11-25 NOTE — Progress Notes (Signed)
Patient ID: Kristen Santos, female    DOB: [birth year], 37 y.o.   MRN: [id number]  Chief Complaint  Patient presents with   Emesis During Pregnancy    Seen in ER 2/26 Did not know she was pregnant until ED visit 8 weeks    HPI: 1st trimester pregnancy:   pt seen in ER due to nausea, fatigue, had tested positive at home for pregnancy, LMP 8 weeks ago, pos. also in ER.  pt has decided along with the father to abort. pt has contacted clinic she went to last year and has first appointment, she will return on Thursday to receive misoprostol/mifepristone. States she is going to ask about getting a tubal ligation done at same time.  Assessment & Plan:  First trimester pregnancy- confirmed in ED, pt having N,V. 2nd unplanned pregnancy in 2 years. She has been seen at local clinic to have preg terminated again, scheduled for pill administration on Thursday. Discussed options for permanent birth control, pt to discuss with the clinic on Thursday.  Nausea/vomiting in pregnancy -  -     Ondansetron HCl; Take 1 tablet (4 mg total) by mouth every 8 (eight) hours as needed for nausea or vomiting. pt is terminating pregnancy  Dispense: 20 tablet; Refill: 0  Subjective:    Outpatient Medications Prior to Visit  Medication Sig Dispense Refill   buPROPion ER (WELLBUTRIN SR) 100 MG 12 hr tablet Take 1 tablet (100 mg total) by mouth 2 (two) times daily. Start with one pill in the morning for 3-4 days, then increase to twice a day. (Patient not taking: Reported on 11/25/2022) 60 tablet 0   hydrochlorothiazide (HYDRODIURIL) 12.5 MG tablet Take 1 tablet (12.5 mg total) by mouth in the morning. OK to cut in half if dizzy. (Patient not taking: Reported on 11/25/2022) 30 tablet 2   No facility-administered medications prior to visit.   Past Medical History:  Diagnosis Date   Chlamydia trachomatis infection in mother during third trimester of pregnancy 05/18/2016   TOC 9/19- neg  TOC 10/25/16: positive '@34'$  wks    H/O influenza 10/25/2016   Ileus ([place])    Post-dates pregnancy 12/11/2016   Screening for STD (sexually transmitted disease) 12/09/2016   Supervision of normal pregnancy, antepartum 06/06/2016     Clinic [facility] Prenatal Labs  Dating LMP c/w 10w sono Blood type: O/Positive/-- (09/19 1452) O pos  Genetic Screen Quad: negative      Antibody:Negative (09/19 1452)Neg  Anatomic [place]  suboptimal  Completed '@36'$  wks;normal; female; EFW: 54% Rubella: 6.36 (09/19 1452)Immune  GTT Third trimester: 82/116/70 RPR: Non Reactive (12/12 1050)   Flu vaccine 06/08/2016 HBsAg: Negative (09/19 1452) neg  TDaP v   Trichomonal vaginitis during pregnancy in third trimester 11/29/2016   '@38'$  weeks.     UTI (lower urinary tract infection)    Past Surgical History:  Procedure Laterality Date   ABDOMINAL SURGERY     Allergies  Allergen Reactions   Penicillins Shortness Of Breath, Itching and Other (See Comments)    Has patient had a PCN reaction causing immediate rash, facial/tongue/throat swelling, SOB or lightheadedness with hypotension: Yes Has patient had a PCN reaction causing severe rash involving mucus membranes or skin necrosis: No Has patient had a PCN reaction that required hospitalization No Has patient had a PCN reaction occurring within the last 10 years: Yes If all of the above answers are "NO", then may proceed with Cephalosporin use.      Objective:    Physical  Exam Vitals and nursing note reviewed.  Constitutional:      Appearance: Normal appearance.  Cardiovascular:     Rate and Rhythm: Normal rate and regular rhythm.  Pulmonary:     Effort: Pulmonary effort is normal.     Breath sounds: Normal breath sounds.  Musculoskeletal:        General: Normal range of motion.  Skin:    General: Skin is warm and dry.  Neurological:     Mental Status: She is alert.  Psychiatric:        Mood and Affect: Mood normal.        Behavior: Behavior normal.   BP 110/70   Pulse 77   Temp 97.7 F (36.5  C) (Temporal)   Resp 16   Wt 222 lb 6.4 oz (100.9 kg)   LMP 10/08/2022 (Approximate)   SpO2 100%   BMI 37.01 kg/m  Wt Readings from Last 3 Encounters:  11/25/22 222 lb 6.4 oz (100.9 kg)  11/15/22 220 lb (99.8 kg)  11/01/22 225 lb 8 oz (102.3 kg)       Jeanie Sewer, NP

## 2022-11-27 ENCOUNTER — Other Ambulatory Visit: Payer: Self-pay

## 2022-11-27 ENCOUNTER — Emergency Department (HOSPITAL_COMMUNITY)
Admission: EM | Admit: 2022-11-27 | Discharge: 2022-11-27 | Disposition: A | Discharge status: Home / Self Care | Payer: Federal, State, Local not specified - PPO | Attending: Emergency Medicine | Admitting: Emergency Medicine

## 2022-11-27 ENCOUNTER — Encounter (HOSPITAL_COMMUNITY): Payer: Self-pay

## 2022-11-27 DIAGNOSIS — O26891 Other specified pregnancy related conditions, first trimester: Secondary | ICD-10-CM | POA: Diagnosis not present

## 2022-11-27 DIAGNOSIS — O99891 Other specified diseases and conditions complicating pregnancy: Secondary | ICD-10-CM | POA: Diagnosis not present

## 2022-11-27 DIAGNOSIS — R519 Headache, unspecified: Secondary | ICD-10-CM | POA: Diagnosis not present

## 2022-11-27 DIAGNOSIS — Z3A08 8 weeks gestation of pregnancy: Secondary | ICD-10-CM | POA: Insufficient documentation

## 2022-11-27 DIAGNOSIS — Z3A01 Less than 8 weeks gestation of pregnancy: Secondary | ICD-10-CM | POA: Diagnosis not present

## 2022-11-27 LAB — I-STAT CHEM 8, ED
BUN: 10 mg/dL (ref 6–20)
Calcium, Ion: 1.23 mmol/L (ref 1.15–1.40)
Chloride: 102 mmol/L (ref 98–111)
Creatinine, Ser: 0.5 mg/dL (ref 0.44–1.00)
Glucose, Bld: 98 mg/dL (ref 70–99)
HCT: 40 % (ref 36.0–46.0)
Hemoglobin: 13.6 g/dL (ref 12.0–15.0)
Potassium: 3.9 mmol/L (ref 3.5–5.1)
Sodium: 137 mmol/L (ref 135–145)
TCO2: 25 mmol/L (ref 22–32)

## 2022-11-27 MED ORDER — PRENATAL COMPLETE 14-0.4 MG PO TABS: 1.0000 | ORAL_TABLET | Freq: Every day | ORAL | 8 refills | Status: AC

## 2022-11-27 MED ORDER — DOXYLAMINE SUCCINATE (SLEEP) 25 MG PO TABS: 25.0000 mg | ORAL_TABLET | Freq: Two times a day (BID) | ORAL | 0 refills | Status: DC | PRN

## 2022-11-27 MED ORDER — ACETAMINOPHEN 325 MG PO TABS: 650.0000 mg | ORAL_TABLET | Freq: Four times a day (QID) | ORAL | 0 refills | Status: DC | PRN

## 2022-11-27 MED ORDER — ACETAMINOPHEN 500 MG PO TABS
1000.0000 mg | ORAL_TABLET | Freq: Once | ORAL | Status: AC
  Administered 2022-11-27: 1000 mg via ORAL
  Filled 2022-11-27: qty 2

## 2022-11-27 MED ORDER — LACTATED RINGERS IV BOLUS
1000.0000 mL | Freq: Once | INTRAVENOUS | Status: AC
  Administered 2022-11-27: 1000 mL via INTRAVENOUS

## 2022-11-27 MED ORDER — PYRIDOXINE HCL 500 MG PO TABS: 500.0000 mg | ORAL_TABLET | Freq: Two times a day (BID) | ORAL | 0 refills | Status: DC | PRN

## 2022-11-27 MED ORDER — ONDANSETRON HCL 4 MG/2ML IJ SOLN
4.0000 mg | Freq: Once | INTRAMUSCULAR | Status: AC
  Administered 2022-11-27: 4 mg via INTRAVENOUS
  Filled 2022-11-27: qty 2

## 2022-11-27 NOTE — ED Provider Notes (Signed)
[facility] Provider Note   CSN: [id number] Arrival date & time: 11/27/22  0507     History  Chief Complaint  Patient presents with   Headache    Kristen Santos is a 37 y.o. female.  37 yo F here with headache. Approximately [redacted] weeks pregnant. No fever. No neck pain. No trauma. No medical problems. Doesn't know what to take for headaches when pregnant.    Headache      Home Medications Prior to Admission medications   Medication Sig Start Date End Date Taking? Authorizing Provider  acetaminophen (TYLENOL) 325 MG tablet Take 2 tablets (650 mg total) by mouth every 6 (six) hours as needed. 11/27/22  Yes Carry Weesner, Corene Cornea, MD  doxylamine, Sleep, (UNISOM) 25 MG tablet Take 1 tablet (25 mg total) by mouth 2 (two) times daily as needed (with the pyridoxine for nausea). 11/27/22 12/27/22 Yes Miloh Alcocer, Corene Cornea, MD  Prenatal Vit-Fe Fumarate-FA (PRENATAL COMPLETE) 14-0.4 MG TABS Take 1 tablet by mouth daily. 11/27/22 12/27/22 Yes Keyonia Gluth, Corene Cornea, MD  pyridoxine (B-6) 500 MG tablet Take 1 tablet (500 mg total) by mouth 2 (two) times daily as needed (with the doxylamine for nausea). 11/27/22  Yes Randy Whitener, Corene Cornea, MD  ondansetron (ZOFRAN) 4 MG tablet Take 1 tablet (4 mg total) by mouth every 8 (eight) hours as needed for nausea or vomiting. pt is terminating pregnancy 11/25/22   Jeanie Sewer, NP      Allergies    Penicillins    Review of Systems   Review of Systems  Neurological:  Positive for headaches.    Physical Exam Updated Vital Signs BP 110/61   Pulse 63   Temp 98 F (36.7 C) (Oral)   Resp 14   Ht '5\' 5"'$  (1.651 m)   Wt 100.7 kg   LMP 10/08/2022 (Approximate)   SpO2 100%   BMI 36.94 kg/m  Physical Exam Vitals and nursing note reviewed.  Constitutional:      Appearance: She is well-developed.  HENT:     Head: Normocephalic and atraumatic.  Cardiovascular:     Rate and Rhythm: Normal rate and regular rhythm.  Pulmonary:     Effort: No  respiratory distress.     Breath sounds: No stridor.  Abdominal:     General: There is no distension.  Musculoskeletal:        General: No swelling or tenderness. Normal range of motion.     Cervical back: Normal range of motion.  Skin:    General: Skin is warm and dry.  Neurological:     Mental Status: She is alert and oriented to person, place, and time.     Cranial Nerves: No cranial nerve deficit.     Sensory: No sensory deficit.     ED Results / Procedures / Treatments   Labs (all labs ordered are listed, but only abnormal results are displayed) Labs Reviewed  I-STAT CHEM 8, ED    EKG None  Radiology No results found.  Procedures Procedures    Medications Ordered in ED Medications  lactated ringers bolus 1,000 mL (0 mLs Intravenous Stopped 11/27/22 0724)  acetaminophen (TYLENOL) tablet 1,000 mg (1,000 mg Oral Given 11/27/22 0615)  ondansetron (ZOFRAN) injection 4 mg (4 mg Intravenous Given 11/27/22 [id number])    ED Course/ Medical Decision Making/ A&P                             Medical Decision  Making Risk OTC drugs. Prescription drug management.   Headache improved. No obvious causes. Too early for preeclampsia worries. Low concern for meningitis. Doubt head bleed. Considered possibly venous thrombosis related to hypercoagulability but no neurologic changes, but clinically doesn't fit.  Will rx tylenol, diclegis equivalent and PNV.    Final Clinical Impression(s) / ED Diagnoses Final diagnoses:  Acute nonintractable headache, unspecified headache type    Rx / DC Orders ED Discharge Orders          Ordered    pyridoxine (B-6) 500 MG tablet  2 times daily PRN        11/27/22 0741    doxylamine, Sleep, (UNISOM) 25 MG tablet  2 times daily PRN        11/27/22 0741    acetaminophen (TYLENOL) 325 MG tablet  Every 6 hours PRN        11/27/22 0741    Prenatal Vit-Fe Fumarate-FA (PRENATAL COMPLETE) 14-0.4 MG TABS  Daily        11/27/22 0744               Teniola Tseng, Corene Cornea, MD 11/27/22 2312

## 2022-11-27 NOTE — Discharge Instructions (Addendum)
Please start taking prenatal vitamins. You can get these over the counter, if not I have prescribed you some.

## 2022-11-27 NOTE — ED Triage Notes (Signed)
Pt arrived to triage complaining of headache that started last night. Pt states she is [redacted]wks pregnant and has also been having n/v   Pt states she does not normally get headaches and wasn't sure what she could take being pregnant

## 2022-11-30 DIAGNOSIS — R519 Headache, unspecified: Secondary | ICD-10-CM | POA: Diagnosis not present

## 2022-11-30 DIAGNOSIS — I959 Hypotension, unspecified: Secondary | ICD-10-CM | POA: Diagnosis not present

## 2022-11-30 DIAGNOSIS — Z3A08 8 weeks gestation of pregnancy: Secondary | ICD-10-CM | POA: Diagnosis not present

## 2022-11-30 DIAGNOSIS — R1030 Lower abdominal pain, unspecified: Secondary | ICD-10-CM | POA: Diagnosis not present

## 2022-12-01 ENCOUNTER — Ambulatory Visit (HOSPITAL_COMMUNITY)
Admission: EM | Admit: 2022-12-01 | Discharge: 2022-12-01 | Disposition: A | Discharge status: Home / Self Care | Payer: Federal, State, Local not specified - PPO | Attending: Family Medicine | Admitting: Family Medicine

## 2022-12-01 ENCOUNTER — Encounter (HOSPITAL_COMMUNITY): Payer: Self-pay

## 2022-12-01 DIAGNOSIS — O210 Mild hyperemesis gravidarum: Secondary | ICD-10-CM

## 2022-12-01 DIAGNOSIS — Z3A08 8 weeks gestation of pregnancy: Secondary | ICD-10-CM | POA: Diagnosis not present

## 2022-12-01 LAB — POCT URINALYSIS DIPSTICK, ED / UC
Bilirubin Urine: NEGATIVE
Glucose, UA: NEGATIVE mg/dL
Hgb urine dipstick: NEGATIVE
Ketones, ur: 15 mg/dL — AB
Leukocytes,Ua: NEGATIVE
Nitrite: NEGATIVE
Protein, ur: NEGATIVE mg/dL
Specific Gravity, Urine: 1.025 (ref 1.005–1.030)
Urobilinogen, UA: 1 mg/dL (ref 0.0–1.0)
pH: 7 (ref 5.0–8.0)

## 2022-12-01 MED ORDER — DOXYLAMINE SUCCINATE (SLEEP) 25 MG PO TABS: 25.0000 mg | ORAL_TABLET | Freq: Two times a day (BID) | ORAL | 0 refills | Status: DC | PRN

## 2022-12-01 MED ORDER — PYRIDOXINE HCL 25 MG PO TABS
500.0000 mg | ORAL_TABLET | Freq: Two times a day (BID) | ORAL | 0 refills | Status: DC | PRN
Start: 2022-12-01 — End: 2023-05-30

## 2022-12-01 NOTE — ED Triage Notes (Signed)
Pt is currently 8 wks  in pregnancy . Pt is here for nausea, headache, and  vomiting so that the pt can eat .

## 2022-12-02 NOTE — ED Provider Notes (Signed)
[facility]   [id number] 12/01/22 Arrival Time: [id number]  ASSESSMENT & PLAN:  1. Hyperemesis gravidarum    Printed Rx given to ensure she is able to get below.  Meds ordered this encounter  Medications   pyridoxine (VITAMIN B6) 25 MG tablet    Sig: Take 20 tablets (500 mg total) by mouth 2 (two) times daily as needed (with the doxylamine for nausea).    Dispense:  60 tablet    Refill:  0   doxylamine, Sleep, (UNISOM) 25 MG tablet    Sig: Take 1 tablet (25 mg total) by mouth 2 (two) times daily as needed (with the pyridoxine for nausea).    Dispense:  60 tablet    Refill:  0    Follow-up Information     Go to  [facility] (MAU).   Why: If worsening or failing to improve as anticipated. Contact information: [address] [place],  [place]  [address]                 Reviewed expectations re: course of current medical issues. Questions answered. Outlined signs and symptoms indicating need for more acute intervention. Patient verbalized understanding. After Visit Summary given.   SUBJECTIVE: History from: patient.  Kristen Santos is a 37 y.o. female who is currently [redacted] wks pregnant. Pt is here for nausea, headache, and vomiting. Several days. Denies abd pain, dysuria, fever, chills, vaginal bleeding.  Tolerating sips of water today. Seen in ED 11/27/22; given Rx for n/v; unable to fill for some reason; unclear.  Patient's last menstrual period was 10/08/2022 (approximate).  Past Surgical History:  Procedure Laterality Date   ABDOMINAL SURGERY    .  OBJECTIVE:  Vitals:   12/01/22 1214  BP: 109/73  Pulse: 93  Resp: 12  Temp: 98 F (36.7 C)  TempSrc: Oral  SpO2: 97%    General appearance: alert; no distress Oropharynx: slightly dry Lungs: clear to auscultation bilaterally; unlabored Heart: regular Abdomen: soft; non-distended; no TTP Back: no CVA tenderness Extremities: no edema; symmetrical with  no gross deformities Skin: warm; dry Neurologic: normal gait Psychological: alert and cooperative; normal mood and affect  Labs: Results for orders placed or performed during the hospital encounter of 12/01/22  POC Urinalysis dipstick  Result Value Ref Range   Glucose, UA NEGATIVE NEGATIVE mg/dL   Bilirubin Urine NEGATIVE NEGATIVE   Ketones, ur 15 (A) NEGATIVE mg/dL   Specific Gravity, Urine 1.025 1.005 - 1.030   Hgb urine dipstick NEGATIVE NEGATIVE   pH 7.0 5.0 - 8.0   Protein, ur NEGATIVE NEGATIVE mg/dL   Urobilinogen, UA 1.0 0.0 - 1.0 mg/dL   Nitrite NEGATIVE NEGATIVE   Leukocytes,Ua NEGATIVE NEGATIVE   Labs Reviewed  POCT URINALYSIS DIPSTICK, ED / UC - Abnormal; Notable for the following components:      Result Value   Ketones, ur 15 (*)    All other components within normal limits    Imaging: No results found.  Allergies  Allergen Reactions   Penicillins Shortness Of Breath, Itching and Other (See Comments)    Has patient had a PCN reaction causing immediate rash, facial/tongue/throat swelling, SOB or lightheadedness with hypotension: Yes Has patient had a PCN reaction causing severe rash involving mucus membranes or skin necrosis: No Has patient had a PCN reaction that required hospitalization No Has patient had a PCN reaction occurring within the last 10 years: Yes If all of the above answers are "NO", then  may proceed with Cephalosporin use.                                               Past Medical History:  Diagnosis Date   Chlamydia trachomatis infection in mother during third trimester of pregnancy 05/18/2016   TOC 9/19- neg  TOC 10/25/16: positive '@34'$  wks   H/O influenza 10/25/2016   Ileus ([place])    Post-dates pregnancy 12/11/2016   Screening for STD (sexually transmitted disease) 12/09/2016   Supervision of normal pregnancy, antepartum 06/06/2016     Clinic [facility] Prenatal Labs  Dating LMP c/w 10w sono Blood type: O/Positive/-- (09/19 1452) O pos  Genetic  Screen Quad: negative      Antibody:Negative (09/19 1452)Neg  Anatomic [place]  suboptimal  Completed '@36'$  wks;normal; female; EFW: 54% Rubella: 6.36 (09/19 1452)Immune  GTT Third trimester: 82/116/70 RPR: Non Reactive (12/12 1050)   Flu vaccine 06/08/2016 HBsAg: Negative (09/19 1452) neg  TDaP v   Trichomonal vaginitis during pregnancy in third trimester 11/29/2016   '@38'$  weeks.     UTI (lower urinary tract infection)    Social History   Socioeconomic History   Marital status: Significant Other    Spouse name: Not on file   Number of children: Not on file   Years of education: Not on file   Highest education level: Not on file  Occupational History   Not on file  Tobacco Use   Smoking status: Never   Smokeless tobacco: Never  Substance and Sexual Activity   Alcohol use: No   Drug use: Not Currently    Types: Marijuana    Comment: last used 05/2016   Sexual activity: Yes    Birth control/protection: None  Other Topics Concern   Not on file  Social History Narrative   works 3rd shift at [employer]   Social Determinants of Health   Financial Resource Strain: Not on file  Food Insecurity: Not on file  Transportation Needs: Not on file  Physical Activity: Not on file  Stress: Not on file  Social Connections: Not on file  Intimate Partner Violence: Not on file   Family History  Problem Relation Age of Onset   Hypertension Mother    Kidney disease Father       Vanessa Kick, MD 12/02/22 [phone]

## 2022-12-04 ENCOUNTER — Other Ambulatory Visit: Payer: Self-pay | Admitting: Family

## 2022-12-04 DIAGNOSIS — F4321 Adjustment disorder with depressed mood: Secondary | ICD-10-CM

## 2022-12-06 ENCOUNTER — Encounter (HOSPITAL_COMMUNITY): Payer: Self-pay | Admitting: *Deleted

## 2022-12-06 ENCOUNTER — Inpatient Hospital Stay (HOSPITAL_COMMUNITY)
Admission: AD | Admit: 2022-12-06 | Discharge: 2022-12-07 | Disposition: A | Discharge status: Home / Self Care | Payer: Federal, State, Local not specified - PPO | Attending: Obstetrics and Gynecology | Admitting: Obstetrics and Gynecology

## 2022-12-06 DIAGNOSIS — N939 Abnormal uterine and vaginal bleeding, unspecified: Secondary | ICD-10-CM | POA: Diagnosis not present

## 2022-12-06 DIAGNOSIS — Z9889 Other specified postprocedural states: Secondary | ICD-10-CM | POA: Diagnosis not present

## 2022-12-06 DIAGNOSIS — R102 Pelvic and perineal pain: Secondary | ICD-10-CM | POA: Insufficient documentation

## 2022-12-06 DIAGNOSIS — O209 Hemorrhage in early pregnancy, unspecified: Secondary | ICD-10-CM | POA: Diagnosis not present

## 2022-12-06 DIAGNOSIS — Z3A00 Weeks of gestation of pregnancy not specified: Secondary | ICD-10-CM | POA: Diagnosis not present

## 2022-12-06 LAB — CBC
HCT: 34.7 % — ABNORMAL LOW (ref 36.0–46.0)
Hemoglobin: 12.1 g/dL (ref 12.0–15.0)
MCH: 32.1 pg (ref 26.0–34.0)
MCHC: 34.9 g/dL (ref 30.0–36.0)
MCV: 92 fL (ref 80.0–100.0)
Platelets: 258 10*3/uL (ref 150–400)
RBC: 3.77 MIL/uL — ABNORMAL LOW (ref 3.87–5.11)
RDW: 13.7 % (ref 11.5–15.5)
WBC: 6.9 10*3/uL (ref 4.0–10.5)
nRBC: 0 % (ref 0.0–0.2)

## 2022-12-06 NOTE — MAU Provider Note (Incomplete)
History     CSN: [id number]  Arrival date and time: 12/06/22 2235   Event Date/Time   First Provider Initiated Contact with Patient 12/06/22 2301      Chief Complaint  Patient presents with  . Abdominal Pain  . Vaginal Bleeding   HPI Kristen Santos is a 37 y.o. [id number] who is s/p TAB who presents to MAU for abdominal cramping and vaginal bleeding. ***   OB History     Gravida  5   Para  2   Term  2   Preterm      AB  2   Living  2      SAB  2   IAB      Ectopic      Multiple  0   Live Births  1           Past Medical History:  Diagnosis Date  . Chlamydia trachomatis infection in mother during third trimester of pregnancy 05/18/2016   TOC 9/19- neg  TOC 10/25/16: positive @34  wks  . H/O influenza 10/25/2016  . Ileus ([place])   . Post-dates pregnancy 12/11/2016  . Screening for STD (sexually transmitted disease) 12/09/2016  . Supervision of normal pregnancy, antepartum 06/06/2016     Clinic [facility] Prenatal Labs  Dating LMP c/w 10w sono Blood type: O/Positive/-- (09/19 1452) O pos  Genetic Screen Quad: negative      Antibody:Negative (09/19 1452)Neg  Anatomic [place]  suboptimal  Completed @36  wks;normal; female; EFW: 54% Rubella: 6.36 (09/19 1452)Immune  GTT Third trimester: 82/116/70 RPR: Non Reactive (12/12 1050)   Flu vaccine 06/08/2016 HBsAg: Negative (09/19 1452) neg  TDaP v  . Trichomonal vaginitis during pregnancy in third trimester 11/29/2016   @38  weeks.    Marland Kitchen UTI (lower urinary tract infection)     Past Surgical History:  Procedure Laterality Date  . ABDOMINAL SURGERY      Family History  Problem Relation Age of Onset  . Hypertension Mother   . Kidney disease Father     Social History   Tobacco Use  . Smoking status: Never  . Smokeless tobacco: Never  Substance Use Topics  . Alcohol use: No  . Drug use: Not Currently    Types: Marijuana    Comment: last used 05/2016    Allergies:  Allergies  Allergen Reactions  . Penicillins  Shortness Of Breath, Itching and Other (See Comments)    Has patient had a PCN reaction causing immediate rash, facial/tongue/throat swelling, SOB or lightheadedness with hypotension: Yes Has patient had a PCN reaction causing severe rash involving mucus membranes or skin necrosis: No Has patient had a PCN reaction that required hospitalization No Has patient had a PCN reaction occurring within the last 10 years: Yes If all of the above answers are "NO", then may proceed with Cephalosporin use.    Medications Prior to Admission  Medication Sig Dispense Refill Last Dose  . doxylamine, Sleep, (UNISOM) 25 MG tablet Take 1 tablet (25 mg total) by mouth 2 (two) times daily as needed (with the pyridoxine for nausea). 60 tablet 0   . Prenatal Vit-Fe Fumarate-FA (PRENATAL COMPLETE) 14-0.4 MG TABS Take 1 tablet by mouth daily. 30 tablet 8   . pyridoxine (VITAMIN B6) 25 MG tablet Take 20 tablets (500 mg total) by mouth 2 (two) times daily as needed (with the doxylamine for nausea). 60 tablet 0     Review of Systems Physical Exam   Blood pressure 116/74, pulse 70, temperature 98.3  F (36.8 C), temperature source Oral, resp. rate 18, height 5\' 5"  (1.651 m), weight 103.1 kg, last menstrual period 10/08/2022.  Physical Exam  MAU Course  Procedures  MDM ***  Assessment and Plan  ***  Renee Harder 12/06/2022, 11:58 PM

## 2022-12-06 NOTE — MAU Provider Note (Signed)
History     CSN: [id number]  Arrival date and time: 12/06/22 2235   Event Date/Time   First Provider Initiated Contact with Patient 12/06/22 2301      Chief Complaint  Patient presents with   Abdominal Pain   Vaginal Bleeding   HPI Kristen Santos is a 37 y.o. [id number] who is s/p medication TAB who presents to MAU for abdominal cramping and vaginal bleeding. Patient reports she went to [facility] and was given 4 pills which she took on 3/15. She was instructed to repeat the 4 pills four hours after taking the first four. She reports on the evening on 3/15 she had a lot of bleeding and cramping and eventually passed the baby. She reports today she continues to have bleeding heavy like a period and lower abdominal cramping. She last took 800mg  of Ibuprofen at 5pm which helps a little. She reports she tried to call [facility] today but was unable to reach anyone which prompted her to come here to make sure everything was okay.   OB History     Gravida  5   Para  2   Term  2   Preterm      AB  2   Living  2      SAB  2   IAB      Ectopic      Multiple  0   Live Births  1           Past Medical History:  Diagnosis Date   Chlamydia trachomatis infection in mother during third trimester of pregnancy 05/18/2016   TOC 9/19- neg  TOC 10/25/16: positive @34  wks   H/O influenza 10/25/2016   Ileus ([place])    Post-dates pregnancy 12/11/2016   Screening for STD (sexually transmitted disease) 12/09/2016   Supervision of normal pregnancy, antepartum 06/06/2016     Clinic [facility] Prenatal Labs  Dating LMP c/w 10w sono Blood type: O/Positive/-- (09/19 1452) O pos  Genetic Screen Quad: negative      Antibody:Negative (09/19 1452)Neg  Anatomic [place]  suboptimal  Completed @36  wks;normal; female; EFW: 54% Rubella: 6.36 (09/19 1452)Immune  GTT Third trimester: 82/116/70 RPR: Non Reactive (12/12 1050)   Flu vaccine 06/08/2016 HBsAg: Negative (09/19 1452) neg  TDaP v    Trichomonal vaginitis during pregnancy in third trimester 11/29/2016   @38  weeks.     UTI (lower urinary tract infection)     Past Surgical History:  Procedure Laterality Date   ABDOMINAL SURGERY      Family History  Problem Relation Age of Onset   Hypertension Mother    Kidney disease Father     Social History   Tobacco Use   Smoking status: Never   Smokeless tobacco: Never  Substance Use Topics   Alcohol use: No   Drug use: Not Currently    Types: Marijuana    Comment: last used 05/2016    Allergies:  Allergies  Allergen Reactions   Penicillins Shortness Of Breath, Itching and Other (See Comments)    Has patient had a PCN reaction causing immediate rash, facial/tongue/throat swelling, SOB or lightheadedness with hypotension: Yes Has patient had a PCN reaction causing severe rash involving mucus membranes or skin necrosis: No Has patient had a PCN reaction that required hospitalization No Has patient had a PCN reaction occurring within the last 10 years: Yes If all of the above answers are "NO", then may proceed with Cephalosporin use.    No  medications prior to admission.   Review of Systems  Constitutional: Negative.   Gastrointestinal:  Positive for abdominal pain (cramping).  Genitourinary:  Positive for vaginal bleeding.  All other systems reviewed and are negative.  Physical Exam   Blood pressure 116/74, pulse 70, temperature 98.3 F (36.8 C), temperature source Oral, resp. rate 16, height 5\' 5"  (1.651 m), weight 103.1 kg, last menstrual period 10/08/2022.  Physical Exam Vitals and nursing note reviewed. Exam conducted with a chaperone present.  Constitutional:      General: She is not in acute distress. Cardiovascular:     Rate and Rhythm: Normal rate.  Pulmonary:     Effort: Pulmonary effort is normal. No respiratory distress.  Abdominal:     Palpations: Abdomen is soft.     Tenderness: There is no abdominal tenderness.  Genitourinary:     Comments: Normal external female genitalia, vaginal walls pink and well-rugated, small amount of dark red blood, cervix visually closed without lesions/masses Skin:    General: Skin is warm and dry.  Neurological:     General: No focal deficit present.     Mental Status: She is alert and oriented to person, place, and time.  Psychiatric:        Mood and Affect: Mood normal.        Behavior: Behavior normal.    Results for orders placed or performed during the hospital encounter of 12/06/22 (from the past 24 hour(s))  CBC     Status: Abnormal   Collection Time: 12/06/22 11:27 PM  Result Value Ref Range   WBC 6.9 4.0 - 10.5 K/uL   RBC 3.77 (L) 3.87 - 5.11 MIL/uL   Hemoglobin 12.1 12.0 - 15.0 g/dL   HCT 34.7 (L) 36.0 - 46.0 %   MCV 92.0 80.0 - 100.0 fL   MCH 32.1 26.0 - 34.0 pg   MCHC 34.9 30.0 - 36.0 g/dL   RDW 13.7 11.5 - 15.5 %   Platelets 258 150 - 400 K/uL   nRBC 0.0 0.0 - 0.2 %   US OB Transvaginal  Result Date: 12/07/2022 CLINICAL DATA:  Vaginal bleeding and pelvic cramping. EXAM: TRANSVAGINAL OB ULTRASOUND TECHNIQUE: Transvaginal ultrasound was performed for complete evaluation of the gestation as well as the maternal uterus, adnexal regions, and pelvic cul-de-sac. COMPARISON:  None Available. FINDINGS: The uterus is anteverted and appears unremarkable. The endometrium measures 18 mm in thickness. No intrauterine pregnancy identified. Irregularity of the anterior upper endometrium with vascular flow. This may be sequela prior procedure. However, retained product of conception is not excluded. The ovaries are unremarkable. No significant free fluid in the pelvis. IMPRESSION: No intrauterine pregnancy identified and no suspicious adnexal masses noted. Findings consistent with pregnancy of unknown location and differential diagnosis includes: An early IUP, recent spontaneous miscarriage, or an occult ectopic pregnancy. Clinical correlation and follow-up with serial HCG levels and repeat  ultrasound in 7-11 days, or earlier if clinically indicated, recommended. Electronically Signed   By: Anner Crete M.D.   On: 12/07/2022 00:35    MAU Course  Procedures  MDM CBC [place]  CBC stable. Reviewed [place] with Dr. Ilda Basset who agrees patient is stable for discharge home with outpatient f/u.  Assessment and Plan   1. Status post elective abortion   2. Vaginal bleeding    - Discharge home in stable condition - Return precautions given. Return to MAU as needed for new/worsening symptoms - F/u at [facility], CNM 12/07/2022, 2:16 AM

## 2022-12-06 NOTE — MAU Note (Signed)
Pt says LMP was 10-08-2022 Was 11-15-2022- positive UPT Then on 12-02-2022-  went to [facility]- gave 4 pills - took  then extra 4 doses- started Vb and cramping.  She saw baby come out   Is here bc still VB and cramping  Pad in Triage- small amt red blood . Pain 8/10. Last med - 800 Ibuprofen  at 5pm

## 2022-12-07 ENCOUNTER — Inpatient Hospital Stay (HOSPITAL_COMMUNITY): Payer: Federal, State, Local not specified - PPO

## 2022-12-07 DIAGNOSIS — Z9889 Other specified postprocedural states: Secondary | ICD-10-CM | POA: Diagnosis not present

## 2022-12-07 DIAGNOSIS — Z3A00 Weeks of gestation of pregnancy not specified: Secondary | ICD-10-CM | POA: Diagnosis not present

## 2022-12-07 DIAGNOSIS — O209 Hemorrhage in early pregnancy, unspecified: Secondary | ICD-10-CM | POA: Diagnosis not present

## 2023-04-13 ENCOUNTER — Ambulatory Visit: Payer: Federal, State, Local not specified - PPO | Admitting: Family

## 2023-04-13 ENCOUNTER — Encounter: Payer: Self-pay | Admitting: Family

## 2023-04-13 NOTE — Progress Notes (Deleted)
Patient ID: Kristen Santos, female    DOB: 1986/05/08, 37 y.o.   MRN: [id number]  No chief complaint on file.   HPI: HX from 10/2022: has had off and on for a few days. States she elevates overnight but still has swelling in am. Reports her job requires her to stand for long periods with little walking. She was unable to work last night due to pain. *Last visit 110m ago, given hydrochlorothiazide 12.5, advised on compression socks.      Assessment & Plan:  There are no diagnoses linked to this encounter.  Subjective:    Outpatient Medications Prior to Visit  Medication Sig Dispense Refill   doxylamine, Sleep, (UNISOM) 25 MG tablet Take 1 tablet (25 mg total) by mouth 2 (two) times daily as needed (with the pyridoxine for nausea). 60 tablet 0   pyridoxine (VITAMIN B6) 25 MG tablet Take 20 tablets (500 mg total) by mouth 2 (two) times daily as needed (with the doxylamine for nausea). 60 tablet 0   No facility-administered medications prior to visit.   Past Medical History:  Diagnosis Date   Chlamydia trachomatis infection in mother during third trimester of pregnancy 05/18/2016   TOC 9/19- neg  TOC 10/25/16: positive @34  wks   H/O influenza 10/25/2016   Ileus ([facility])    Post-dates pregnancy 12/11/2016   Screening for STD (sexually transmitted disease) 12/09/2016   Supervision of normal pregnancy, antepartum 06/06/2016     Clinic [facility] Prenatal Labs  Dating LMP c/w 10w sono Blood type: O/Positive/-- (09/19 1452) O pos  Genetic Screen Quad: negative      Antibody:Negative (09/19 1452)Neg  Anatomic [place]  suboptimal  Completed @36  wks;normal; female; EFW: 54% Rubella: 6.36 (09/19 1452)Immune  GTT Third trimester: 82/116/70 RPR: Non Reactive (12/12 1050)   Flu vaccine 06/08/2016 HBsAg: Negative (09/19 1452) neg  TDaP v   Trichomonal vaginitis during pregnancy in third trimester 11/29/2016   @38  weeks.     UTI (lower urinary tract infection)    Past Surgical History:  Procedure Laterality  Date   ABDOMINAL SURGERY     Allergies  Allergen Reactions   Penicillins Shortness Of Breath, Itching and Other (See Comments)    Has patient had a PCN reaction causing immediate rash, facial/tongue/throat swelling, SOB or lightheadedness with hypotension: Yes Has patient had a PCN reaction causing severe rash involving mucus membranes or skin necrosis: No Has patient had a PCN reaction that required hospitalization No Has patient had a PCN reaction occurring within the last 10 years: Yes If all of the above answers are "NO", then may proceed with Cephalosporin use.      Objective:    Physical Exam Vitals and nursing note reviewed.  Constitutional:      Appearance: Normal appearance.  Cardiovascular:     Rate and Rhythm: Normal rate and regular rhythm.  Pulmonary:     Effort: Pulmonary effort is normal.     Breath sounds: Normal breath sounds.  Musculoskeletal:        General: Normal range of motion.  Skin:    General: Skin is warm and dry.  Neurological:     Mental Status: She is alert.  Psychiatric:        Mood and Affect: Mood normal.        Behavior: Behavior normal.    LMP 10/08/2022 (Approximate)  Wt Readings from Last 3 Encounters:  12/06/22 227 lb 3.2 oz (103.1 kg)  11/27/22 222 lb (100.7 kg)  11/25/22 222 lb  6.4 oz (100.9 kg)       Dulce Sellar, NP

## 2023-04-14 ENCOUNTER — Ambulatory Visit: Payer: Federal, State, Local not specified - PPO | Admitting: Family

## 2023-05-17 DIAGNOSIS — Z124 Encounter for screening for malignant neoplasm of cervix: Secondary | ICD-10-CM | POA: Diagnosis not present

## 2023-05-17 DIAGNOSIS — Z01419 Encounter for gynecological examination (general) (routine) without abnormal findings: Secondary | ICD-10-CM | POA: Diagnosis not present

## 2023-05-17 DIAGNOSIS — Z113 Encounter for screening for infections with a predominantly sexual mode of transmission: Secondary | ICD-10-CM | POA: Diagnosis not present

## 2023-05-17 DIAGNOSIS — Z3201 Encounter for pregnancy test, result positive: Secondary | ICD-10-CM | POA: Diagnosis not present

## 2023-05-17 DIAGNOSIS — Z1151 Encounter for screening for human papillomavirus (HPV): Secondary | ICD-10-CM | POA: Diagnosis not present

## 2023-05-17 LAB — HM PAP SMEAR

## 2023-05-18 DIAGNOSIS — N911 Secondary amenorrhea: Secondary | ICD-10-CM | POA: Diagnosis not present

## 2023-05-18 LAB — HM PAP SMEAR: HPV, high-risk: NEGATIVE

## 2023-05-24 ENCOUNTER — Inpatient Hospital Stay (HOSPITAL_COMMUNITY)
Admission: AD | Admit: 2023-05-24 | Discharge: 2023-05-24 | Disposition: A | Discharge status: Home / Self Care | Payer: Federal, State, Local not specified - PPO | Source: Home / Self Care | Attending: Obstetrics and Gynecology | Admitting: Obstetrics and Gynecology

## 2023-05-24 ENCOUNTER — Other Ambulatory Visit: Payer: Self-pay

## 2023-05-24 ENCOUNTER — Encounter (HOSPITAL_COMMUNITY): Payer: Self-pay

## 2023-05-24 DIAGNOSIS — S40022A Contusion of left upper arm, initial encounter: Secondary | ICD-10-CM | POA: Insufficient documentation

## 2023-05-24 DIAGNOSIS — T7491XA Unspecified adult maltreatment, confirmed, initial encounter: Secondary | ICD-10-CM | POA: Diagnosis not present

## 2023-05-24 DIAGNOSIS — S0081XA Abrasion of other part of head, initial encounter: Secondary | ICD-10-CM | POA: Insufficient documentation

## 2023-05-24 DIAGNOSIS — A5901 Trichomonal vulvovaginitis: Secondary | ICD-10-CM | POA: Insufficient documentation

## 2023-05-24 DIAGNOSIS — O98811 Other maternal infectious and parasitic diseases complicating pregnancy, first trimester: Secondary | ICD-10-CM | POA: Insufficient documentation

## 2023-05-24 DIAGNOSIS — O9A311 Physical abuse complicating pregnancy, first trimester: Secondary | ICD-10-CM | POA: Insufficient documentation

## 2023-05-24 DIAGNOSIS — Z3A08 8 weeks gestation of pregnancy: Secondary | ICD-10-CM | POA: Diagnosis not present

## 2023-05-24 DIAGNOSIS — M25562 Pain in left knee: Secondary | ICD-10-CM | POA: Insufficient documentation

## 2023-05-24 MED ORDER — CYCLOBENZAPRINE HCL 10 MG PO TABS: 5.0000 mg | ORAL_TABLET | Freq: Three times a day (TID) | ORAL | 2 refills | Status: DC | PRN
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
## 2023-05-30 ENCOUNTER — Ambulatory Visit (INDEPENDENT_AMBULATORY_CARE_PROVIDER_SITE_OTHER): Payer: Federal, State, Local not specified - PPO | Admitting: Family

## 2023-05-30 ENCOUNTER — Telehealth: Payer: Self-pay | Admitting: Family

## 2023-05-30 VITALS — BP 120/78 | HR 83 | Temp 97.8°F | Ht 65.0 in | Wt 230.6 lb

## 2023-05-30 DIAGNOSIS — Z3A09 9 weeks gestation of pregnancy: Secondary | ICD-10-CM | POA: Diagnosis not present

## 2023-05-30 DIAGNOSIS — A599 Trichomoniasis, unspecified: Secondary | ICD-10-CM

## 2023-05-30 DIAGNOSIS — O219 Vomiting of pregnancy, unspecified: Secondary | ICD-10-CM | POA: Diagnosis not present

## 2023-05-30 MED ORDER — DOXYLAMINE-PYRIDOXINE 10-10 MG PO TBEC: 2.0000 | DELAYED_RELEASE_TABLET | Freq: Three times a day (TID) | ORAL | 0 refills | Status: DC | PRN

## 2023-05-30 MED ORDER — METRONIDAZOLE 0.75 % VA GEL: 1.0000 | Freq: Every day | VAGINAL | 0 refills | Status: AC

## 2023-05-30 NOTE — Telephone Encounter (Addendum)
Patient was unable to pay copay today.  Patient is aware this will be billed and must bring copay in going forward.
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
## 2023-05-30 NOTE — Patient Instructions (Signed)
It was very nice to see you today!   I will review your lab results via MyChart in a few days.   Have a great rest of the week!   PLEASE NOTE:  If you had any lab tests please let us know if you have not heard back within a few days. You may see your results on MyChart before we have a chance to review them but we will give you a call once they are reviewed by us. If we ordered any referrals today, please let us know if you have not heard from their office within the next week.    

## 2023-05-31 ENCOUNTER — Encounter: Payer: Self-pay | Admitting: Family

## 2023-05-31 ENCOUNTER — Telehealth: Payer: Self-pay | Admitting: Family

## 2023-06-01 ENCOUNTER — Telehealth: Payer: Self-pay

## 2023-06-01 NOTE — Telephone Encounter (Signed)
-----   Message from [place] sent at 05/30/2023 10:59 PM EDT ----- Regarding: lab result Please call [facility] - pt told she was positive for Trich -can they confirm? She was seen in office on 8/27 and again on 9/5 for PAP w/trich - I see office notes but no lab results. Let me know, thanks.

## 2023-06-01 NOTE — Telephone Encounter (Signed)
error 

## 2023-06-01 NOTE — Telephone Encounter (Signed)
I reached out to [facility], Records received and placed on providers desk for review.    **scanned to chart

## 2023-06-02 DIAGNOSIS — Z3481 Encounter for supervision of other normal pregnancy, first trimester: Secondary | ICD-10-CM | POA: Diagnosis not present

## 2023-06-02 DIAGNOSIS — Z3685 Encounter for antenatal screening for Streptococcus B: Secondary | ICD-10-CM | POA: Diagnosis not present

## 2023-06-02 DIAGNOSIS — Z3400 Encounter for supervision of normal first pregnancy, unspecified trimester: Secondary | ICD-10-CM | POA: Diagnosis not present

## 2023-06-02 DIAGNOSIS — Z3A10 10 weeks gestation of pregnancy: Secondary | ICD-10-CM | POA: Diagnosis not present

## 2023-06-02 LAB — OB RESULTS CONSOLE HIV ANTIBODY (ROUTINE TESTING): HIV: NONREACTIVE

## 2023-06-02 LAB — OB RESULTS CONSOLE HEPATITIS B SURFACE ANTIGEN: Hepatitis B Surface Ag: NEGATIVE

## 2023-06-02 LAB — OB RESULTS CONSOLE RUBELLA ANTIBODY, IGM: Rubella: IMMUNE

## 2023-06-02 LAB — OB RESULTS CONSOLE RPR: RPR: NONREACTIVE

## 2023-06-02 LAB — HEPATITIS C ANTIBODY: HCV Ab: NEGATIVE

## 2023-06-05 ENCOUNTER — Inpatient Hospital Stay (HOSPITAL_COMMUNITY)
Admission: AD | Admit: 2023-06-05 | Discharge: 2023-06-05 | Disposition: A | Discharge status: Home / Self Care | Payer: Federal, State, Local not specified - PPO | Attending: Obstetrics and Gynecology | Admitting: Obstetrics and Gynecology

## 2023-06-05 ENCOUNTER — Inpatient Hospital Stay (HOSPITAL_COMMUNITY): Payer: Federal, State, Local not specified - PPO

## 2023-06-05 DIAGNOSIS — O09521 Supervision of elderly multigravida, first trimester: Secondary | ICD-10-CM | POA: Diagnosis not present

## 2023-06-05 DIAGNOSIS — Z3A08 8 weeks gestation of pregnancy: Secondary | ICD-10-CM | POA: Insufficient documentation

## 2023-06-05 DIAGNOSIS — Z3A10 10 weeks gestation of pregnancy: Secondary | ICD-10-CM | POA: Diagnosis not present

## 2023-06-05 DIAGNOSIS — O26891 Other specified pregnancy related conditions, first trimester: Secondary | ICD-10-CM

## 2023-06-05 DIAGNOSIS — O09511 Supervision of elderly primigravida, first trimester: Secondary | ICD-10-CM | POA: Diagnosis not present

## 2023-06-05 DIAGNOSIS — R109 Unspecified abdominal pain: Secondary | ICD-10-CM

## 2023-06-05 LAB — COMPREHENSIVE METABOLIC PANEL
ALT: 12 U/L (ref 0–44)
AST: 13 U/L — ABNORMAL LOW (ref 15–41)
Albumin: 3 g/dL — ABNORMAL LOW (ref 3.5–5.0)
Alkaline Phosphatase: 37 U/L — ABNORMAL LOW (ref 38–126)
Anion gap: 8 (ref 5–15)
BUN: 8 mg/dL (ref 6–20)
CO2: 22 mmol/L (ref 22–32)
Calcium: 8.7 mg/dL — ABNORMAL LOW (ref 8.9–10.3)
Chloride: 101 mmol/L (ref 98–111)
Creatinine, Ser: 0.62 mg/dL (ref 0.44–1.00)
GFR, Estimated: >60 mL/min (ref >60)
Glucose, Bld: 98 mg/dL (ref 70–99)
Potassium: 3.3 mmol/L — ABNORMAL LOW (ref 3.5–5.1)
Sodium: 131 mmol/L — ABNORMAL LOW (ref 135–145)
Total Bilirubin: 0.2 mg/dL — ABNORMAL LOW (ref 0.3–1.2)
Total Protein: 6.7 g/dL (ref 6.5–8.1)

## 2023-06-05 LAB — HCG, QUANTITATIVE, PREGNANCY: hCG, Beta Chain, Quant, S: 54382 m[IU]/mL — ABNORMAL HIGH (ref <5)

## 2023-06-05 LAB — CBC
HCT: 35.5 % — ABNORMAL LOW (ref 36.0–46.0)
Hemoglobin: 12.2 g/dL (ref 12.0–15.0)
MCH: 30.7 pg (ref 26.0–34.0)
MCHC: 34.4 g/dL (ref 30.0–36.0)
MCV: 89.4 fL (ref 80.0–100.0)
Platelets: 279 10*3/uL (ref 150–400)
RBC: 3.97 MIL/uL (ref 3.87–5.11)
RDW: 13 % (ref 11.5–15.5)
WBC: 6.4 10*3/uL (ref 4.0–10.5)
nRBC: 0 % (ref 0.0–0.2)

## 2023-06-05 MED ORDER — ACETAMINOPHEN 325 MG PO TABS
650.0000 mg | ORAL_TABLET | Freq: Once | ORAL | Status: AC
  Administered 2023-06-05: 650 mg via ORAL
  Filled 2023-06-05: qty 2

## 2023-06-05 NOTE — Discharge Instructions (Signed)
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
## 2023-06-05 NOTE — MAU Note (Signed)
.  Kristen Santos is a 37 y.o. at [redacted]w[redacted]d here in MAU reporting: sharp pain while she was at work that shoots in her lower abdomen and the sides.  Denies VB or abnormal discharge.   LMP: 04/05/23 Onset of complaint: 0200 Pain score: 6 Vitals:   06/05/23 0345  BP: 114/70  Pulse: 70  Resp: 17  Temp: 98 F (36.7 C)  SpO2: 100%      Lab orders placed from triage:    none

## 2023-06-05 NOTE — MAU Note (Signed)
Kristen Santos, CNM discharged pt.    [name]

## 2023-06-13 DIAGNOSIS — O98319 Other infections with a predominantly sexual mode of transmission complicating pregnancy, unspecified trimester: Secondary | ICD-10-CM | POA: Diagnosis not present

## 2023-06-13 DIAGNOSIS — N760 Acute vaginitis: Secondary | ICD-10-CM | POA: Diagnosis not present

## 2023-06-13 DIAGNOSIS — Z3A11 11 weeks gestation of pregnancy: Secondary | ICD-10-CM | POA: Diagnosis not present

## 2023-06-13 DIAGNOSIS — A599 Trichomoniasis, unspecified: Secondary | ICD-10-CM | POA: Diagnosis not present

## 2023-06-13 DIAGNOSIS — Z113 Encounter for screening for infections with a predominantly sexual mode of transmission: Secondary | ICD-10-CM | POA: Diagnosis not present

## 2023-06-13 DIAGNOSIS — Z3481 Encounter for supervision of other normal pregnancy, first trimester: Secondary | ICD-10-CM | POA: Diagnosis not present

## 2023-06-15 ENCOUNTER — Encounter: Payer: Self-pay | Admitting: Family

## 2023-07-18 DIAGNOSIS — Z361 Encounter for antenatal screening for raised alphafetoprotein level: Secondary | ICD-10-CM | POA: Diagnosis not present

## 2023-08-05 ENCOUNTER — Other Ambulatory Visit: Payer: Self-pay | Admitting: Family

## 2023-08-05 DIAGNOSIS — O219 Vomiting of pregnancy, unspecified: Secondary | ICD-10-CM

## 2023-08-08 ENCOUNTER — Other Ambulatory Visit: Payer: Self-pay | Admitting: Family

## 2023-08-08 DIAGNOSIS — O219 Vomiting of pregnancy, unspecified: Secondary | ICD-10-CM

## 2023-08-08 NOTE — Telephone Encounter (Signed)
call pt and let her know med is not covered now - needs to contact her OB for new med. Thx

## 2023-08-23 DIAGNOSIS — N760 Acute vaginitis: Secondary | ICD-10-CM | POA: Diagnosis not present

## 2023-08-23 DIAGNOSIS — Z363 Encounter for antenatal screening for malformations: Secondary | ICD-10-CM | POA: Diagnosis not present

## 2023-08-23 DIAGNOSIS — Z3A21 21 weeks gestation of pregnancy: Secondary | ICD-10-CM | POA: Diagnosis not present

## 2023-09-14 ENCOUNTER — Inpatient Hospital Stay (HOSPITAL_COMMUNITY)
Admission: AD | Admit: 2023-09-14 | Discharge: 2023-09-14 | Disposition: A | Discharge status: Home / Self Care | Payer: Federal, State, Local not specified - PPO | Attending: Obstetrics and Gynecology | Admitting: Obstetrics and Gynecology

## 2023-09-14 ENCOUNTER — Encounter (HOSPITAL_COMMUNITY): Payer: Self-pay | Admitting: Obstetrics and Gynecology

## 2023-09-14 DIAGNOSIS — O26892 Other specified pregnancy related conditions, second trimester: Secondary | ICD-10-CM | POA: Insufficient documentation

## 2023-09-14 DIAGNOSIS — A599 Trichomoniasis, unspecified: Secondary | ICD-10-CM | POA: Diagnosis not present

## 2023-09-14 DIAGNOSIS — R519 Headache, unspecified: Secondary | ICD-10-CM | POA: Diagnosis not present

## 2023-09-14 DIAGNOSIS — O98312 Other infections with a predominantly sexual mode of transmission complicating pregnancy, second trimester: Secondary | ICD-10-CM | POA: Insufficient documentation

## 2023-09-14 DIAGNOSIS — M545 Low back pain, unspecified: Secondary | ICD-10-CM | POA: Insufficient documentation

## 2023-09-14 DIAGNOSIS — Z3A24 24 weeks gestation of pregnancy: Secondary | ICD-10-CM | POA: Diagnosis not present

## 2023-09-14 LAB — URINALYSIS, ROUTINE W REFLEX MICROSCOPIC
Bacteria, UA: NONE SEEN
Bilirubin Urine: NEGATIVE
Glucose, UA: NEGATIVE mg/dL
Hgb urine dipstick: NEGATIVE
Ketones, ur: NEGATIVE mg/dL
Nitrite: NEGATIVE
Protein, ur: NEGATIVE mg/dL
Specific Gravity, Urine: 1.026 (ref 1.005–1.030)
pH: 5 (ref 5.0–8.0)

## 2023-09-14 MED ORDER — ACETAMINOPHEN 500 MG PO TABS
1000.0000 mg | ORAL_TABLET | Freq: Once | ORAL | Status: AC
  Administered 2023-09-14: 1000 mg via ORAL
  Filled 2023-09-14: qty 2

## 2023-09-14 MED ORDER — METRONIDAZOLE 500 MG PO TABS
2000.0000 mg | ORAL_TABLET | Freq: Once | ORAL | Status: AC
  Administered 2023-09-14: 2000 mg via ORAL
  Filled 2023-09-14: qty 4

## 2023-09-14 NOTE — MAU Note (Signed)
Reginae Lecates is a 37 y.o. at [redacted]w[redacted]d here in MAU reporting: back pain that started while the pt was at work tonight around 2200. Pt states its a constant cramp and sore feeling in her lower back. Pt states she thinks the pain is mostly from work because she sits up in a hard chair all day. Pt also complains of white foul smelling discharge. Pt reports being diagnosed with Trich on 12/3 but has not picked up the medication due to financial issues. Pt denies pain with urination. Pt denies LOF or VB. +FM   Onset of complaint: 2200 Pain score: 8/10 lower back cramping  Vitals:   09/14/23 0224  BP: 121/71  Pulse: 86  Resp: 16  Temp: 97.7 F (36.5 C)  SpO2: 100%     FHT:147 Lab orders placed from triage:  UA

## 2023-09-14 NOTE — MAU Provider Note (Signed)
History     CSN: [id number]  Arrival date and time: 09/14/23 4098   Event Date/Time   First Provider Initiated Contact with Patient 09/14/23 0240      Chief Complaint  Patient presents with   Back Pain   Kristen Santos , a  37 y.o. [id number] at [redacted]w[redacted]d presents to MAU with complaints of on-going back pain since 10 pm. Patient states that she sits in a hard chair at this is when her pain started. She describes it as a long cramp that just does not let up. Currently rates as a 8/10 and denies attempting to relieve symptoms. She also reports a waxing and waning headache that she has has all day. She states that she has not been resting well and states that she only had 2 bottles of water and a subway sandwich today. She denies abnormal vaginal discharge and urinary symptoms. Reports that she was tested positive for Trich on 12/3 but has been unable to get treatment. She endorses positive fetal movement.          OB History     Gravida  7   Para  2   Term  2   Preterm      AB  4   Living  2      SAB  2   IAB  2   Ectopic      Multiple  0   Live Births  1           Past Medical History:  Diagnosis Date   Chlamydia trachomatis infection in mother during third trimester of pregnancy 05/18/2016   TOC 9/19- neg  TOC 10/25/16: positive @34  wks   H/O influenza 10/25/2016   Ileus (HCC)    Post-dates pregnancy 12/11/2016   Screening for STD (sexually transmitted disease) 12/09/2016   Supervision of normal pregnancy, antepartum 06/06/2016     Clinic [facility] Prenatal Labs  Dating LMP c/w 10w sono Blood type: O/Positive/-- (09/19 1452) O pos  Genetic Screen Quad: negative      Antibody:Negative (09/19 1452)Neg  Anatomic [place]  suboptimal  Completed @36  wks;normal; female; EFW: 54% Rubella: 6.36 (09/19 1452)Immune  GTT Third trimester: 82/116/70 RPR: Non Reactive (12/12 1050)   Flu vaccine 06/08/2016 HBsAg: Negative (09/19 1452) neg  TDaP v   Trichomonal vaginitis during  pregnancy in third trimester 11/29/2016   @38  weeks.     UTI (lower urinary tract infection)     Past Surgical History:  Procedure Laterality Date   ABDOMINAL SURGERY      Family History  Problem Relation Age of Onset   Hypertension Mother    Kidney disease Father     Social History   Tobacco Use   Smoking status: Never   Smokeless tobacco: Never  Substance Use Topics   Alcohol use: No   Drug use: Not Currently    Types: Marijuana    Comment: last used 05/2016    Allergies:  Allergies  Allergen Reactions   Penicillins Shortness Of Breath, Itching and Other (See Comments)    Has patient had a PCN reaction causing immediate rash, facial/tongue/throat swelling, SOB or lightheadedness with hypotension: Yes Has patient had a PCN reaction causing severe rash involving mucus membranes or skin necrosis: No Has patient had a PCN reaction that required hospitalization No Has patient had a PCN reaction occurring within the last 10 years: Yes If all of the above answers are "NO", then may proceed with Cephalosporin use.    Medications  Prior to Admission  Medication Sig Dispense Refill Last Dose/Taking   Prenatal Vit-Fe Fumarate-FA (PRENATAL MULTIVITAMIN) TABS tablet Take 1 tablet by mouth daily at 12 noon.   09/13/2023   DICLEGIS 10-10 MG TBEC START WITH 2 TABLETS AT BEDTIME FOR 2 NIGHTS. IF NEEDED, ADD 1 TABLET IN THE MORNING, 2 TABLETS AT BEDTIME, THEN 1 TABLET TWICE A DAY, 2 TABLETS AT BEDTIME IF NEEDED. MAX DOSE 4 PILLS IN 24H. 60 tablet 0     Review of Systems  Constitutional:  Negative for chills, fatigue and fever.  Eyes:  Negative for pain and visual disturbance.  Respiratory:  Negative for apnea, shortness of breath and wheezing.   Cardiovascular:  Negative for chest pain and palpitations.  Gastrointestinal:  Negative for abdominal pain, constipation, diarrhea, nausea and vomiting.  Genitourinary:  Negative for difficulty urinating, dysuria, pelvic pain, vaginal  bleeding, vaginal discharge and vaginal pain.  Musculoskeletal:  Positive for back pain.  Neurological:  Positive for headaches. Negative for seizures and weakness.  Psychiatric/Behavioral:  Negative for suicidal ideas.    Physical Exam   Blood pressure 121/71, pulse 86, temperature 97.7 F (36.5 C), temperature source Oral, resp. rate 16, height 5\' 5"  (1.651 m), weight 113.9 kg, last menstrual period 04/05/2023, SpO2 100%, unknown if currently breastfeeding.  Physical Exam Vitals and nursing note reviewed.  Constitutional:      General: She is not in acute distress.    Appearance: Normal appearance.  HENT:     Head: Normocephalic.  Pulmonary:     Effort: Pulmonary effort is normal.  Musculoskeletal:     Cervical back: Normal range of motion.  Skin:    General: Skin is warm and dry.  Neurological:     Mental Status: She is alert and oriented to person, place, and time.  Psychiatric:        Mood and Affect: Mood normal.    FHT: 150bpm with moderate variability. Occasional 10x10 accels no decels  - Toco: Quiet   MAU Course  Procedures Orders Placed This Encounter  Procedures   Urinalysis, Routine w reflex microscopic -Urine, Clean Catch   Meds ordered this encounter  Medications   metroNIDAZOLE (FLAGYL) tablet 2,000 mg   acetaminophen (TYLENOL) tablet 1,000 mg   Results for orders placed or performed during the hospital encounter of 09/14/23 (from the past 24 hours)  Urinalysis, Routine w reflex microscopic -Urine, Clean Catch     Status: Abnormal   Collection Time: 09/14/23  2:33 AM  Result Value Ref Range   Color, Urine YELLOW YELLOW   APPearance CLEAR CLEAR   Specific Gravity, Urine 1.026 1.005 - 1.030   pH 5.0 5.0 - 8.0   Glucose, UA NEGATIVE NEGATIVE mg/dL   Hgb urine dipstick NEGATIVE NEGATIVE   Bilirubin Urine NEGATIVE NEGATIVE   Ketones, ur NEGATIVE NEGATIVE mg/dL   Protein, ur NEGATIVE NEGATIVE mg/dL   Nitrite NEGATIVE NEGATIVE   Leukocytes,Ua TRACE (A)  NEGATIVE   RBC / HPF 0-5 0 - 5 RBC/hpf   WBC, UA 0-5 0 - 5 WBC/hpf   Bacteria, UA NONE SEEN NONE SEEN   Squamous Epithelial / HPF 0-5 0 - 5 /HPF   Mucus PRESENT      MDM - UA noted trace leuks otherwise normal. Low suspicion for UTI causing back pan  - PO pain management and in-house treatment for Trich provided.  - Pain improved with PO Tylenol and heat applied to affected area  from 8/10-->5/10  - Plan for discharge  Assessment and Plan   1. Acute midline low back pain, unspecified whether sciatica present   2. [redacted] weeks gestation of pregnancy   3. Trichomoniasis   4. Pregnancy headache in second trimester    - Reviewed comfort measures for back pain as pregnancy continues to progress.  - Reviewed to abstain from intercourse or use condoms and notify partner(s) as needed.  - Reviewed worsening signs and return precautions  - FHT appropriate for gestational age upon discharge.  - Patient discharged home in stable condition and may return to MAU as needed.   Claudette Head, MSN CNM  09/14/2023, 2:40 AM

## 2023-09-29 DIAGNOSIS — Z3480 Encounter for supervision of other normal pregnancy, unspecified trimester: Secondary | ICD-10-CM | POA: Diagnosis not present

## 2023-09-29 DIAGNOSIS — Z113 Encounter for screening for infections with a predominantly sexual mode of transmission: Secondary | ICD-10-CM | POA: Diagnosis not present

## 2023-10-13 DIAGNOSIS — Z23 Encounter for immunization: Secondary | ICD-10-CM | POA: Diagnosis not present

## 2023-10-13 DIAGNOSIS — N760 Acute vaginitis: Secondary | ICD-10-CM | POA: Diagnosis not present

## 2023-10-27 DIAGNOSIS — N760 Acute vaginitis: Secondary | ICD-10-CM | POA: Diagnosis not present

## 2023-11-25 DIAGNOSIS — N760 Acute vaginitis: Secondary | ICD-10-CM | POA: Diagnosis not present

## 2023-11-25 DIAGNOSIS — Z3685 Encounter for antenatal screening for Streptococcus B: Secondary | ICD-10-CM | POA: Diagnosis not present

## 2023-11-25 LAB — OB RESULTS CONSOLE GBS: GBS: NEGATIVE

## 2023-12-15 DIAGNOSIS — Z3A38 38 weeks gestation of pregnancy: Secondary | ICD-10-CM | POA: Diagnosis not present

## 2023-12-15 DIAGNOSIS — O26843 Uterine size-date discrepancy, third trimester: Secondary | ICD-10-CM | POA: Diagnosis not present

## 2023-12-22 DIAGNOSIS — N760 Acute vaginitis: Secondary | ICD-10-CM | POA: Diagnosis not present

## 2023-12-23 ENCOUNTER — Telehealth (HOSPITAL_COMMUNITY): Payer: Self-pay | Admitting: *Deleted

## 2023-12-23 ENCOUNTER — Inpatient Hospital Stay (HOSPITAL_COMMUNITY)
Admission: RE | Admit: 2023-12-23 | Discharge status: Absent without leave | Payer: Self-pay | Source: Home / Self Care | Attending: Obstetrics and Gynecology | Admitting: Obstetrics and Gynecology

## 2023-12-23 NOTE — Telephone Encounter (Signed)
 Preadmission screen

## 2023-12-24 ENCOUNTER — Inpatient Hospital Stay (HOSPITAL_COMMUNITY): Admitting: Anesthesiology

## 2023-12-24 ENCOUNTER — Encounter (HOSPITAL_COMMUNITY)
Admission: RE | Disposition: A | Discharge status: Home / Self Care | Payer: Self-pay | Source: Home / Self Care | Attending: Obstetrics and Gynecology

## 2023-12-24 ENCOUNTER — Other Ambulatory Visit: Payer: Self-pay

## 2023-12-24 ENCOUNTER — Encounter (HOSPITAL_COMMUNITY): Payer: Self-pay | Admitting: Obstetrics and Gynecology

## 2023-12-24 ENCOUNTER — Inpatient Hospital Stay (HOSPITAL_COMMUNITY)
Admission: RE | Admit: 2023-12-24 | Discharge: 2023-12-27 | DRG: 788 | Disposition: A | Discharge status: Home / Self Care | Attending: Obstetrics and Gynecology | Admitting: Obstetrics and Gynecology

## 2023-12-24 DIAGNOSIS — Z3490 Encounter for supervision of normal pregnancy, unspecified, unspecified trimester: Principal | ICD-10-CM

## 2023-12-24 DIAGNOSIS — O99214 Obesity complicating childbirth: Principal | ICD-10-CM | POA: Diagnosis present

## 2023-12-24 DIAGNOSIS — Z3A39 39 weeks gestation of pregnancy: Secondary | ICD-10-CM | POA: Diagnosis not present

## 2023-12-24 DIAGNOSIS — E66813 Obesity, class 3: Secondary | ICD-10-CM | POA: Diagnosis not present

## 2023-12-24 DIAGNOSIS — O26893 Other specified pregnancy related conditions, third trimester: Secondary | ICD-10-CM | POA: Diagnosis not present

## 2023-12-24 DIAGNOSIS — Z23 Encounter for immunization: Secondary | ICD-10-CM | POA: Diagnosis not present

## 2023-12-24 LAB — CBC
HCT: 33.1 % — ABNORMAL LOW (ref 36.0–46.0)
Hemoglobin: 11.1 g/dL — ABNORMAL LOW (ref 12.0–15.0)
MCH: 29.9 pg (ref 26.0–34.0)
MCHC: 33.5 g/dL (ref 30.0–36.0)
MCV: 89.2 fL (ref 80.0–100.0)
Platelets: 340 10*3/uL (ref 150–400)
RBC: 3.71 MIL/uL — ABNORMAL LOW (ref 3.87–5.11)
RDW: 13 % (ref 11.5–15.5)
WBC: 5.6 10*3/uL (ref 4.0–10.5)
nRBC: 0 % (ref 0.0–0.2)

## 2023-12-24 LAB — TYPE AND SCREEN
ABO/RH(D): O POS
Antibody Screen: NEGATIVE

## 2023-12-24 SURGERY — Surgical Case
Anesthesia: Epidural | Site: Abdomen

## 2023-12-24 MED ORDER — TERBUTALINE SULFATE 1 MG/ML IJ SOLN: 0.2500 mg | Freq: Once | INTRAMUSCULAR | Status: DC | PRN

## 2023-12-24 MED ORDER — MISOPROSTOL 50MCG HALF TABLET: 50.0000 ug | ORAL_TABLET | ORAL | Status: DC

## 2023-12-24 MED ORDER — SCOPOLAMINE 1 MG/3DAYS TD PT72: 1.0000 | MEDICATED_PATCH | Freq: Once | TRANSDERMAL | Status: DC

## 2023-12-24 MED ORDER — GENTAMICIN SULFATE 40 MG/ML IJ SOLN: 5.0000 mg/kg | Freq: Once | INTRAMUSCULAR | Status: DC

## 2023-12-24 MED ORDER — NALOXONE HCL 4 MG/10ML IJ SOLN: 1.0000 ug/kg/h | INTRAVENOUS | Status: DC | PRN

## 2023-12-24 MED ORDER — ONDANSETRON HCL 4 MG/2ML IJ SOLN
4.0000 mg | Freq: Four times a day (QID) | INTRAMUSCULAR | Status: DC | PRN
  Administered 2023-12-24: 4 mg via INTRAVENOUS

## 2023-12-24 MED ORDER — OXYTOCIN-SODIUM CHLORIDE 30-0.9 UT/500ML-% IV SOLN
1.0000 m[IU]/min | INTRAVENOUS | Status: DC
  Administered 2023-12-24: 2 m[IU]/min via INTRAVENOUS
  Filled 2023-12-24: qty 500

## 2023-12-24 MED ORDER — OXYTOCIN-SODIUM CHLORIDE 30-0.9 UT/500ML-% IV SOLN
INTRAVENOUS | Status: AC
  Filled 2023-12-24: qty 500

## 2023-12-24 MED ORDER — MISOPROSTOL 25 MCG QUARTER TABLET: 25.0000 ug | ORAL_TABLET | ORAL | Status: DC

## 2023-12-24 MED ORDER — ACETAMINOPHEN 325 MG PO TABS: 650.0000 mg | ORAL_TABLET | ORAL | Status: DC | PRN

## 2023-12-24 MED ORDER — OXYCODONE HCL 5 MG PO TABS: 5.0000 mg | ORAL_TABLET | Freq: Once | ORAL | Status: DC | PRN

## 2023-12-24 MED ORDER — DIPHENHYDRAMINE HCL 50 MG/ML IJ SOLN: 12.5000 mg | INTRAMUSCULAR | Status: DC | PRN

## 2023-12-24 MED ORDER — SODIUM CHLORIDE 0.9 % IV SOLN
INTRAVENOUS | Status: AC
  Filled 2023-12-24: qty 5

## 2023-12-24 MED ORDER — KETOROLAC TROMETHAMINE 30 MG/ML IJ SOLN
30.0000 mg | Freq: Once | INTRAMUSCULAR | Status: AC | PRN
  Administered 2023-12-24: 30 mg via INTRAVENOUS

## 2023-12-24 MED ORDER — OXYCODONE HCL 5 MG/5ML PO SOLN: 5.0000 mg | Freq: Once | ORAL | Status: DC | PRN

## 2023-12-24 MED ORDER — LACTATED RINGERS IV SOLN: INTRAVENOUS | Status: DC

## 2023-12-24 MED ORDER — AMISULPRIDE (ANTIEMETIC) 5 MG/2ML IV SOLN: 10.0000 mg | Freq: Once | INTRAVENOUS | Status: DC | PRN

## 2023-12-24 MED ORDER — SODIUM CHLORIDE 0.9% FLUSH: 3.0000 mL | INTRAVENOUS | Status: DC | PRN

## 2023-12-24 MED ORDER — MORPHINE SULFATE (PF) 0.5 MG/ML IJ SOLN
INTRAMUSCULAR | Status: DC | PRN
  Administered 2023-12-24: 3 mg via EPIDURAL

## 2023-12-24 MED ORDER — FENTANYL CITRATE (PF) 100 MCG/2ML IJ SOLN
INTRAMUSCULAR | Status: DC | PRN
  Administered 2023-12-24: 100 ug via EPIDURAL

## 2023-12-24 MED ORDER — LIDOCAINE HCL (PF) 1 % IJ SOLN
INTRAMUSCULAR | Status: DC | PRN
  Administered 2023-12-24: 5 mL via EPIDURAL

## 2023-12-24 MED ORDER — DEXAMETHASONE SODIUM PHOSPHATE 10 MG/ML IJ SOLN
INTRAMUSCULAR | Status: DC | PRN
Start: 2023-12-24 — End: 2023-12-24
  Administered 2023-12-24: 10 mg via INTRAVENOUS

## 2023-12-24 MED ORDER — OXYTOCIN BOLUS FROM INFUSION
333.0000 mL | Freq: Once | INTRAVENOUS | Status: DC
Start: 2023-12-24 — End: 2023-12-25

## 2023-12-24 MED ORDER — MISOPROSTOL 25 MCG QUARTER TABLET
25.0000 ug | ORAL_TABLET | Freq: Once | ORAL | Status: AC
  Administered 2023-12-24: 25 ug via VAGINAL
  Filled 2023-12-24: qty 1

## 2023-12-24 MED ORDER — SOD CITRATE-CITRIC ACID 500-334 MG/5ML PO SOLN
30.0000 mL | ORAL | Status: AC
Start: 2023-12-24 — End: 2023-12-24
  Administered 2023-12-24: 30 mL via ORAL

## 2023-12-24 MED ORDER — LACTATED RINGERS IV SOLN: 500.0000 mL | Freq: Once | INTRAVENOUS | Status: AC

## 2023-12-24 MED ORDER — OXYCODONE-ACETAMINOPHEN 5-325 MG PO TABS: 1.0000 | ORAL_TABLET | ORAL | Status: DC | PRN

## 2023-12-24 MED ORDER — KETOROLAC TROMETHAMINE 30 MG/ML IJ SOLN
INTRAMUSCULAR | Status: AC
  Filled 2023-12-24: qty 1

## 2023-12-24 MED ORDER — DIPHENHYDRAMINE HCL 25 MG PO CAPS: 25.0000 mg | ORAL_CAPSULE | Freq: Four times a day (QID) | ORAL | Status: DC | PRN

## 2023-12-24 MED ORDER — ACETAMINOPHEN 10 MG/ML IV SOLN: 1000.0000 mg | Freq: Once | INTRAVENOUS | Status: DC | PRN

## 2023-12-24 MED ORDER — EPHEDRINE 5 MG/ML INJ
10.0000 mg | INTRAVENOUS | Status: DC | PRN
  Filled 2023-12-24: qty 5

## 2023-12-24 MED ORDER — MISOPROSTOL 25 MCG QUARTER TABLET: 25.0000 ug | ORAL_TABLET | Freq: Once | ORAL | Status: DC

## 2023-12-24 MED ORDER — ONDANSETRON HCL 4 MG/2ML IJ SOLN: 4.0000 mg | Freq: Three times a day (TID) | INTRAMUSCULAR | Status: DC | PRN

## 2023-12-24 MED ORDER — ACETAMINOPHEN 10 MG/ML IV SOLN
INTRAVENOUS | Status: DC | PRN
Start: 2023-12-24 — End: 2023-12-24
  Administered 2023-12-24: 1000 mg via INTRAVENOUS

## 2023-12-24 MED ORDER — SOD CITRATE-CITRIC ACID 500-334 MG/5ML PO SOLN
30.0000 mL | ORAL | Status: DC | PRN
  Filled 2023-12-24: qty 30

## 2023-12-24 MED ORDER — DEXAMETHASONE SODIUM PHOSPHATE 10 MG/ML IJ SOLN
INTRAMUSCULAR | Status: AC
  Filled 2023-12-24: qty 1

## 2023-12-24 MED ORDER — LACTATED RINGERS IV SOLN
500.0000 mL | INTRAVENOUS | Status: DC | PRN
  Administered 2023-12-24 (×2): 500 mL via INTRAVENOUS

## 2023-12-24 MED ORDER — FENTANYL-BUPIVACAINE-NACL 0.5-0.125-0.9 MG/250ML-% EP SOLN
12.0000 mL/h | EPIDURAL | Status: DC | PRN
  Administered 2023-12-24: 12 mL/h via EPIDURAL
  Filled 2023-12-24: qty 250

## 2023-12-24 MED ORDER — NALOXONE HCL 0.4 MG/ML IJ SOLN: 0.4000 mg | INTRAMUSCULAR | Status: DC | PRN

## 2023-12-24 MED ORDER — MORPHINE SULFATE (PF) 0.5 MG/ML IJ SOLN
INTRAMUSCULAR | Status: AC
  Filled 2023-12-24: qty 10

## 2023-12-24 MED ORDER — CEFAZOLIN SODIUM-DEXTROSE 3-4 GM/150ML-% IV SOLN: 3.0000 g | INTRAVENOUS | Status: DC

## 2023-12-24 MED ORDER — ONDANSETRON HCL 4 MG/2ML IJ SOLN
INTRAMUSCULAR | Status: AC
  Filled 2023-12-24: qty 2

## 2023-12-24 MED ORDER — MISOPROSTOL 25 MCG QUARTER TABLET
25.0000 ug | ORAL_TABLET | Freq: Once | ORAL | Status: AC
  Administered 2023-12-24: 25 ug via ORAL
  Filled 2023-12-24: qty 1

## 2023-12-24 MED ORDER — PHENYLEPHRINE 80 MCG/ML (10ML) SYRINGE FOR IV PUSH (FOR BLOOD PRESSURE SUPPORT)
80.0000 ug | PREFILLED_SYRINGE | INTRAVENOUS | Status: AC | PRN
  Administered 2023-12-24: 80 ug via INTRAVENOUS
  Administered 2023-12-24: 160 ug via INTRAVENOUS
  Filled 2023-12-24: qty 10

## 2023-12-24 MED ORDER — FENTANYL CITRATE (PF) 100 MCG/2ML IJ SOLN: 25.0000 ug | INTRAMUSCULAR | Status: DC | PRN

## 2023-12-24 MED ORDER — OXYTOCIN-SODIUM CHLORIDE 30-0.9 UT/500ML-% IV SOLN
2.5000 [IU]/h | INTRAVENOUS | Status: DC
  Administered 2023-12-24: 30 [IU] via INTRAVENOUS

## 2023-12-24 MED ORDER — OXYCODONE-ACETAMINOPHEN 5-325 MG PO TABS: 2.0000 | ORAL_TABLET | ORAL | Status: DC | PRN

## 2023-12-24 MED ORDER — FENTANYL CITRATE (PF) 100 MCG/2ML IJ SOLN
INTRAMUSCULAR | Status: AC
  Filled 2023-12-24: qty 2

## 2023-12-24 MED ORDER — MEPERIDINE HCL 25 MG/ML IJ SOLN: 6.2500 mg | INTRAMUSCULAR | Status: DC | PRN

## 2023-12-24 MED ORDER — CLINDAMYCIN PHOSPHATE 900 MG/50ML IV SOLN
INTRAVENOUS | Status: DC | PRN
  Administered 2023-12-24: 900 mg via INTRAVENOUS

## 2023-12-24 MED ORDER — LIDOCAINE-EPINEPHRINE (PF) 1.5 %-1:200000 IJ SOLN
INTRAMUSCULAR | Status: DC | PRN
  Administered 2023-12-24: 5 mL via EPIDURAL

## 2023-12-24 MED ORDER — DIPHENHYDRAMINE HCL 50 MG/ML IJ SOLN: 12.5000 mg | Freq: Four times a day (QID) | INTRAMUSCULAR | Status: DC | PRN

## 2023-12-24 MED ORDER — LIDOCAINE HCL (PF) 1 % IJ SOLN: 30.0000 mL | INTRAMUSCULAR | Status: DC | PRN

## 2023-12-24 MED ORDER — PHENYLEPHRINE 80 MCG/ML (10ML) SYRINGE FOR IV PUSH (FOR BLOOD PRESSURE SUPPORT)
80.0000 ug | PREFILLED_SYRINGE | INTRAVENOUS | Status: DC | PRN
  Administered 2023-12-24 (×2): 80 ug via INTRAVENOUS

## 2023-12-24 MED ORDER — AZITHROMYCIN 500 MG IV SOLR
500.0000 mg | INTRAVENOUS | Status: AC
  Administered 2023-12-24: 500 mg via INTRAVENOUS

## 2023-12-24 MED ORDER — STERILE WATER FOR IRRIGATION IR SOLN
Status: DC | PRN
  Administered 2023-12-24: 1000 mL

## 2023-12-24 MED ORDER — TRANEXAMIC ACID-NACL 1000-0.7 MG/100ML-% IV SOLN
INTRAVENOUS | Status: AC
  Filled 2023-12-24: qty 100

## 2023-12-24 MED ORDER — EPHEDRINE 5 MG/ML INJ: 10.0000 mg | INTRAVENOUS | Status: DC | PRN

## 2023-12-24 SURGICAL SUPPLY — 31 items
BENZOIN TINCTURE PRP APPL 2/3 (GAUZE/BANDAGES/DRESSINGS) ×2 IMPLANT
CHLORAPREP W/TINT 26 (MISCELLANEOUS) ×4 IMPLANT
CLAMP UMBILICAL CORD (MISCELLANEOUS) ×2 IMPLANT
CLOTH BEACON ORANGE TIMEOUT ST (SAFETY) ×2 IMPLANT
DERMABOND ADVANCED .7 DNX12 (GAUZE/BANDAGES/DRESSINGS) IMPLANT
DRSG OPSITE POSTOP 4X10 (GAUZE/BANDAGES/DRESSINGS) ×2 IMPLANT
ELECT REM PT RETURN 9FT ADLT (ELECTROSURGICAL) ×1 IMPLANT
ELECTRODE REM PT RTRN 9FT ADLT (ELECTROSURGICAL) ×2 IMPLANT
EXTRACTOR VACUUM KIWI (MISCELLANEOUS) IMPLANT
GAUZE PAD ABD 7.5X8 STRL (GAUZE/BANDAGES/DRESSINGS) IMPLANT
GAUZE SPONGE 4X4 12PLY STRL LF (GAUZE/BANDAGES/DRESSINGS) IMPLANT
GLOVE BIOGEL PI IND STRL 6 (GLOVE) ×2 IMPLANT
GLOVE SS PI 5.5 STRL (GLOVE) ×2 IMPLANT
GOWN STRL REUS W/TWL LRG LVL3 (GOWN DISPOSABLE) ×4 IMPLANT
KIT ABG SYR 3ML LUER SLIP (SYRINGE) ×2 IMPLANT
NDL HYPO 25X5/8 SAFETYGLIDE (NEEDLE) ×2 IMPLANT
NEEDLE HYPO 25X5/8 SAFETYGLIDE (NEEDLE) ×1 IMPLANT
NS IRRIG 1000ML POUR BTL (IV SOLUTION) ×2 IMPLANT
PACK C SECTION WH (CUSTOM PROCEDURE TRAY) ×2 IMPLANT
PAD OB MATERNITY 4.3X12.25 (PERSONAL CARE ITEMS) ×2 IMPLANT
RTRCTR C-SECT PINK 25CM LRG (MISCELLANEOUS) ×2 IMPLANT
STRIP CLOSURE SKIN 1/2X4 (GAUZE/BANDAGES/DRESSINGS) IMPLANT
SUT CHROMIC 2 0 CT 1 (SUTURE) ×2 IMPLANT
SUT MON AB 2-0 CT1 27 (SUTURE) ×2 IMPLANT
SUT MON AB-0 CT1 36 (SUTURE) ×2 IMPLANT
SUT PDS AB 0 CT1 27 (SUTURE) ×2 IMPLANT
SUT VIC AB 0 CTX36XBRD ANBCTRL (SUTURE) ×4 IMPLANT
SUT VIC AB 4-0 PS2 27 (SUTURE) ×2 IMPLANT
TOWEL OR 17X24 6PK STRL BLUE (TOWEL DISPOSABLE) ×2 IMPLANT
TRAY FOLEY W/BAG SLVR 14FR LF (SET/KITS/TRAYS/PACK) IMPLANT
WATER STERILE IRR 1000ML POUR (IV SOLUTION) ×2 IMPLANT

## 2023-12-24 NOTE — Progress Notes (Signed)
 Labor Progress Note  Feeling cramping, resting in bed. FHT cat 1. Cervix 1/th/-3. Foley bulb discussed with patient, she is in agreement with plan. FB placed with 60ccs in uterine balloon. Will start low dose pitocin in conjunction. Epidural when desires.  Jule Economy, MD

## 2023-12-24 NOTE — Progress Notes (Addendum)
 Labor Progress Note  Feeling well after epidural, Bps have been soft requiring phenylephrine. FHT with subtle late decelerations, suspect BP-related. Pitocin has been turned off. FB out, cervix 5/80/-2 - AROMed after discussion with patient, clear fluid Closely monitor FHT BP after AROM 100/50s > continue BP management and position changes.  Jule Economy, MD

## 2023-12-24 NOTE — H&P (Signed)
 Kristen Santos is a 38 y.o. female presenting for elective IOL. Pregnancy complicated by trichomonal infection x2 in early pregnancy with multiple negative tests since, most recently 4/3. History of SBO s/p ex lap x2 as a child. Two prior uncomplicated SVDs - last was in 2018.  OB History     Gravida  7   Para  2   Term  2   Preterm      AB  4   Living  2      SAB  2   IAB  2   Ectopic      Multiple  0   Live Births  1          Past Medical History:  Diagnosis Date   Chlamydia trachomatis infection in mother during third trimester of pregnancy 05/18/2016   TOC 9/19- neg  TOC 10/25/16: positive @37  wks   H/O influenza 10/25/2016   Ileus (HCC)    Post-dates pregnancy 12/11/2016   Screening for STD (sexually transmitted disease) 12/09/2016   Supervision of normal pregnancy, antepartum 06/06/2016     Clinic [facility] Prenatal Labs  Dating LMP c/w 10w sono Blood type: O/Positive/-- (09/19 1452) O pos  Genetic Screen Quad: negative      Antibody:Negative (09/19 1452)Neg  Anatomic [place]  suboptimal  Completed @37  wks;normal; female; EFW: 54% Rubella: 6.36 (09/19 1452)Immune  GTT Third trimester: 82/116/70 RPR: Non Reactive (12/12 1050)   Flu vaccine 06/08/2016 HBsAg: Negative (09/19 1452) neg  TDaP v   Trichomonal vaginitis during pregnancy in third trimester 11/29/2016   @37  weeks.     UTI (lower urinary tract infection)    Past Surgical History:  Procedure Laterality Date   ABDOMINAL SURGERY     Family History: family history includes Hypertension in her mother; Kidney disease in her father. Social History:  reports that she has never smoked. She has never used smokeless tobacco. She reports that she does not currently use drugs after having used the following drugs: Marijuana. She reports that she does not drink alcohol.     Maternal Diabetes: No Genetic Screening: Normal Maternal Ultrasounds/Referrals: Normal Fetal Ultrasounds or other Referrals:  None Maternal  Substance Abuse:  No Significant Maternal Medications:  None Significant Maternal Lab Results:  Group B Strep negative Number of Prenatal Visits:greater than 3 verified prenatal visits Maternal Vaccinations:TDap and Flu Other Comments:  None        09/14/2023    4:56 AM 09/14/2023    2:44 AM 09/14/2023    2:24 AM  Vitals with BMI  Height   5\' 5"   Weight   251 lbs 2 oz  BMI   41.79  Systolic 116 132 161  Diastolic 72 71 71  Pulse 89 80 86    Last menstrual period 04/05/2023, unknown if currently breastfeeding.  Constitutional:      Appearance: Normal appearance.  HENT:     Head: Normocephalic.  Eyes:     Pupils: Pupils are equal, round Cardiovascular:     Rate and Rhythm: Normal rate    Pulses: Normal pulses.  Abdominal:     General: Abdomen is Gravid, nontender Neurological:     Mental Status: She is alert.  Cervix closed/50/-3; vertex presentation FHT reassuring  Prenatal labs: ABO, Rh:   Antibody:   Rubella: Immune (09/12 0000) RPR: Nonreactive (09/12 0000)  HBsAg: Negative (09/12 0000)  HIV: Non-reactive (09/12 0000)  GBS: Negative/-- (03/07 0000)   Assessment/Plan: 38 yo [id number] at [redacted]w[redacted]d presenting for elective IOL.  Cervix unfavorable. Plan for cytotec > pit/AROM when able. GBS neg  Tawni Levy 12/24/2023, 9:27 AM

## 2023-12-24 NOTE — Progress Notes (Addendum)
 Labor Progress Note Delayed Entry Note  Recurrent late decelerations - cervix changed from 7 to 8cm within the course of 20 min. Trialed hands and knees, however extremely dense epidural. Repositioned to exaggerated L lateral with improvement in FHT. Will continue to labor as long as she continues to make cervical change and FHT remains reassuring.  Jule Economy, MD

## 2023-12-24 NOTE — Op Note (Signed)
 PROCEDURE DATE: 12/24/2023   PREOPERATIVE DIAGNOSIS: [redacted] weeks gestation of pregnancy, nonreassuring fetal heart tracing   POSTOPERATIVE DIAGNOSIS: The same   PROCEDURE: Primary Low Transverse Cesarean Section   SURGEON:  Dr. Jule Economy  ASSISTANT: Dr. Myna Hidalgo An experienced assistant was required given the standard of surgical care given the complexity of the case.  This assistant was needed for exposure, dissection, suctioning, retraction, instrument exchange, assisting with delivery with administration of fundal pressure, and for overall help during the procedure.   INDICATIONS: This is a 37yo [id number] at [redacted]w[redacted]d requiring cesarean section secondary to nonreassuring fetal heart tracing.   Decision made to proceed with LTCS. The risks of cesarean section discussed with the patient included but were not limited to: bleeding which may require transfusion or reoperation; infection which may require antibiotics; injury to bowel, bladder, ureters or other surrounding organs; injury to the fetus; need for additional procedures including hysterectomy in the event of a life-threatening hemorrhage; placental abnormalities wth subsequent pregnancies, incisional problems, thromboembolic phenomenon and other postoperative/anesthesia complications. The patient agreed with the proposed plan, giving informed consent for the procedure.     FINDINGS:  Viable female infant "Chyna" in vertex presentation, APGARs 7+9,  Weight pending, Amniotic fluid clear,  Intact placenta, three vessel cord.  Grossly normal uterus. No pelvic intraabdominal adhesions noted. .   ANESTHESIA:    Epidural ESTIMATED BLOOD LOSS: 1292ccs SPECIMENS: Placenta for routine COMPLICATIONS: None immediate    PROCEDURE IN DETAIL:  The patient received intravenous antibiotics (Clindamycin and azithromycin) and had sequential compression devices applied to her lower extremities while in the preoperative area.  She was then taken to the  operating room where epidural anesthesia was found to be adequate. She was then placed in a dorsal supine position with a leftward tilt, and prepped and draped in a sterile manner.  Her foley catheter was already in place and draining.  After an adequate timeout was performed, a Pfannenstiel skin incision was made with scalpel and carried through to the underlying layer of fascia. The fascia was incised in the midline and this incision was extended bilaterally bluntly. Kocher clamps were applied to the superior aspect of the fascial incision and the underlying rectus muscles were dissected off bluntly. A similar process was carried out on the inferior aspect of the facial incision. The rectus muscles were separated in the midline bluntly and the peritoneum was entered bluntly.  The peritoneum was extended bilaterally and an Alexis retractor was placed for better visualization. A transverse hysterotomy was made with a scalpel and extended bilaterally bluntly. The infant was successfully delivered, and cord was clamped and cut and infant was handed over to awaiting neonatology team. Cord blood and venous blood gas was collected. The placenta was delivered intact with three-vessel cord. The uterus was cleared of clot and debris. Hysterotomy was noted to be bleeding at this point and forceps were used for temporary hemostasis. 1g TXA was administered. The hysterotomy was closed with 0 vicryl. Two figure of eights were placed along the hysterotomy with 0-monocryl to reinforce the incision and aid in hemostasis. Excellent hemostasis was noted. The Alexis retractor was removed. The fascia was closed with 0-PDS in a running fashion with good restoration of anatomy.  The subcutaneus tissue was irrigated and was reapproximated using 2-0 monocryl.  The skin was closed with 4-0 Vicryl in a subcuticular fashion.  All surgical sites examined and hemostatic at end of procedure.   Pt tolerated the procedure well. All  sponge/lap/needle  counts were correct  X 2. Pt taken to recovery room in stable condition.   Jule Economy, MD

## 2023-12-24 NOTE — Transfer of Care (Signed)
 Immediate Anesthesia Transfer of Care Note  Patient: Kristen Santos  Procedure(s) Performed: CESAREAN DELIVERY (Abdomen)  Patient Location: PACU  Anesthesia Type:Epidural  Level of Consciousness: awake, alert , oriented, and patient cooperative  Airway & Oxygen Therapy: Patient Spontanous Breathing  Post-op Assessment: Report given to RN, Post -op Vital signs reviewed and stable, and Patient moving all extremities X 4  Post vital signs: Reviewed and stable  Last Vitals:  Vitals Value Taken Time  BP 111/80 12/24/23 2245  Temp 36.6 C 12/24/23 2235  Pulse 78 12/24/23 2255  Resp 24 12/24/23 2241  SpO2 99 % 12/24/23 2255  Vitals shown include unfiled device data.  Last Pain:  Vitals:   12/24/23 2235  TempSrc: Oral  PainSc: 0-No pain      Patients Stated Pain Goal: 10 (12/24/23 1154)  Complications: No notable events documented.

## 2023-12-24 NOTE — Anesthesia Procedure Notes (Signed)
 Epidural Patient location during procedure: OB Start time: 12/24/2023 5:30 PM End time: 12/24/2023 5:50 PM  Staffing Anesthesiologist: Leonides Grills, MD Performed: anesthesiologist   Preanesthetic Checklist Completed: patient identified, IV checked, site marked, risks and benefits discussed, monitors and equipment checked, pre-op evaluation and timeout performed  Epidural Patient position: sitting Prep: DuraPrep Patient monitoring: heart rate, cardiac monitor, continuous pulse ox and blood pressure Approach: midline Location: L4-L5 Injection technique: LOR air  Needle:  Needle type: Tuohy  Needle gauge: 17 G Needle length: 9 cm Needle insertion depth: 10 cm Catheter type: closed end flexible Catheter size: 19 Gauge Catheter at skin depth: 16 cm Test dose: negative and 1.5% lidocaine with Epi 1:200 K  Assessment Events: blood not aspirated, no cerebrospinal fluid, injection not painful, no injection resistance and negative IV test  Additional Notes Informed consent obtained prior to proceeding including risk of failure, 1% risk of PDPH, risk of minor discomfort and bruising.  Discussed incomplete block and epidural failure.  Discussed alternatives to epidural analgesia and patient desires to proceed. Timeout performed pre-procedure verifying patient name, procedure, and platelet count. Loss of resistance encountered on the third attempt. Patient tolerated procedure well. Reason for block:procedure for pain

## 2023-12-24 NOTE — Anesthesia Preprocedure Evaluation (Addendum)
 Anesthesia Evaluation  Patient identified by MRN, date of birth, ID band Patient awake    Reviewed: Allergy & Precautions, H&P , NPO status , Patient's Chart, lab work & pertinent test results  History of Anesthesia Complications Negative for: history of anesthetic complications  Airway Mallampati: II       Dental no notable dental hx.    Pulmonary neg pulmonary ROS   Pulmonary exam normal        Cardiovascular negative cardio ROS Normal cardiovascular exam     Neuro/Psych negative neurological ROS  negative psych ROS   GI/Hepatic negative GI ROS, Neg liver ROS,,,  Endo/Other    Class 3 obesity  Renal/GU negative Renal ROS  negative genitourinary   Musculoskeletal   Abdominal  (+) + obese  Peds  Hematology  (+) Blood dyscrasia, anemia   Anesthesia Other Findings   Reproductive/Obstetrics (+) Pregnancy                             Anesthesia Physical Anesthesia Plan  ASA: 3  Anesthesia Plan: Epidural   Post-op Pain Management:    Induction:   PONV Risk Score and Plan:   Airway Management Planned:   Additional Equipment:   Intra-op Plan:   Post-operative Plan:   Informed Consent: I have reviewed the patients History and Physical, chart, labs and discussed the procedure including the risks, benefits and alternatives for the proposed anesthesia with the patient or authorized representative who has indicated his/her understanding and acceptance.       Plan Discussed with:   Anesthesia Plan Comments:        Anesthesia Quick Evaluation

## 2023-12-24 NOTE — Progress Notes (Signed)
 Labor Progress Note  Persistent late decelerations and minimal variability - cervix still 8cm. I discussed with her that given persistent cat II to cat III tracing without cervical change, I recommend a cesarean section at this time. Risks discussed including infection, bleeding, damage to surrounding structures (especially given her hx of exploratory laparotomy x2 as a baby), the need for additional procedures including hysterectomy, and the possibility of uterine rupture with neonatal morbidity/mortality, scarring, and abnormal placentation with subsequent pregnancies. Patient agrees to proceed.   All questions answered. Anesthesia and OR team aware.   Jule Economy, MD

## 2023-12-25 LAB — CBC
HCT: 28.4 % — ABNORMAL LOW (ref 36.0–46.0)
Hemoglobin: 9.5 g/dL — ABNORMAL LOW (ref 12.0–15.0)
MCH: 29.5 pg (ref 26.0–34.0)
MCHC: 33.5 g/dL (ref 30.0–36.0)
MCV: 88.2 fL (ref 80.0–100.0)
Platelets: 309 10*3/uL (ref 150–400)
RBC: 3.22 MIL/uL — ABNORMAL LOW (ref 3.87–5.11)
RDW: 12.9 % (ref 11.5–15.5)
WBC: 12.7 10*3/uL — ABNORMAL HIGH (ref 4.0–10.5)
nRBC: 0 % (ref 0.0–0.2)

## 2023-12-25 LAB — BASIC METABOLIC PANEL WITH GFR
Anion gap: 7 (ref 5–15)
BUN: 14 mg/dL (ref 6–20)
CO2: 21 mmol/L — ABNORMAL LOW (ref 22–32)
Calcium: 8.9 mg/dL (ref 8.9–10.3)
Chloride: 105 mmol/L (ref 98–111)
Creatinine, Ser: 0.81 mg/dL (ref 0.44–1.00)
GFR, Estimated: >60 mL/min (ref >60)
Glucose, Bld: 134 mg/dL — ABNORMAL HIGH (ref 70–99)
Potassium: 4.1 mmol/L (ref 3.5–5.1)
Sodium: 133 mmol/L — ABNORMAL LOW (ref 135–145)

## 2023-12-25 LAB — RPR: RPR Ser Ql: NONREACTIVE

## 2023-12-25 MED ORDER — MENTHOL 3 MG MT LOZG: 1.0000 | LOZENGE | OROMUCOSAL | Status: DC | PRN

## 2023-12-25 MED ORDER — OXYTOCIN-SODIUM CHLORIDE 30-0.9 UT/500ML-% IV SOLN
2.5000 [IU]/h | INTRAVENOUS | Status: AC
  Administered 2023-12-25: 2.5 [IU]/h via INTRAVENOUS
  Filled 2023-12-25: qty 500

## 2023-12-25 MED ORDER — KETOROLAC TROMETHAMINE 30 MG/ML IJ SOLN
30.0000 mg | Freq: Four times a day (QID) | INTRAMUSCULAR | Status: AC
  Administered 2023-12-25 (×3): 30 mg via INTRAVENOUS
  Filled 2023-12-25 (×3): qty 1

## 2023-12-25 MED ORDER — SIMETHICONE 80 MG PO CHEW: 80.0000 mg | CHEWABLE_TABLET | ORAL | Status: DC | PRN

## 2023-12-25 MED ORDER — DIBUCAINE (PERIANAL) 1 % EX OINT: 1.0000 | TOPICAL_OINTMENT | CUTANEOUS | Status: DC | PRN

## 2023-12-25 MED ORDER — DIPHENHYDRAMINE HCL 25 MG PO CAPS: 25.0000 mg | ORAL_CAPSULE | Freq: Four times a day (QID) | ORAL | Status: DC | PRN

## 2023-12-25 MED ORDER — LACTATED RINGERS IV BOLUS
500.0000 mL | Freq: Once | INTRAVENOUS | Status: AC
  Administered 2023-12-25: 500 mL via INTRAVENOUS

## 2023-12-25 MED ORDER — OXYCODONE HCL 5 MG PO TABS
5.0000 mg | ORAL_TABLET | ORAL | Status: DC | PRN
  Administered 2023-12-26 (×2): 10 mg via ORAL
  Administered 2023-12-26: 5 mg via ORAL
  Administered 2023-12-27: 10 mg via ORAL
  Administered 2023-12-27: 5 mg via ORAL
  Administered 2023-12-27: 10 mg via ORAL
  Filled 2023-12-25 (×2): qty 2
  Filled 2023-12-25 (×2): qty 1
  Filled 2023-12-25 (×2): qty 2

## 2023-12-25 MED ORDER — COCONUT OIL OIL: 1.0000 | TOPICAL_OIL | Status: DC | PRN

## 2023-12-25 MED ORDER — SENNOSIDES-DOCUSATE SODIUM 8.6-50 MG PO TABS
2.0000 | ORAL_TABLET | Freq: Every day | ORAL | Status: DC
  Administered 2023-12-26 – 2023-12-27 (×2): 2 via ORAL
  Filled 2023-12-25 (×2): qty 2

## 2023-12-25 MED ORDER — PRENATAL MULTIVITAMIN CH
1.0000 | ORAL_TABLET | Freq: Every day | ORAL | Status: DC
  Administered 2023-12-25 – 2023-12-27 (×3): 1 via ORAL
  Filled 2023-12-25 (×3): qty 1

## 2023-12-25 MED ORDER — ZOLPIDEM TARTRATE 5 MG PO TABS: 5.0000 mg | ORAL_TABLET | Freq: Every evening | ORAL | Status: DC | PRN

## 2023-12-25 MED ORDER — ACETAMINOPHEN 500 MG PO TABS
1000.0000 mg | ORAL_TABLET | Freq: Four times a day (QID) | ORAL | Status: DC
  Administered 2023-12-25 – 2023-12-27 (×10): 1000 mg via ORAL
  Filled 2023-12-25 (×10): qty 2

## 2023-12-25 MED ORDER — SIMETHICONE 80 MG PO CHEW
80.0000 mg | CHEWABLE_TABLET | Freq: Three times a day (TID) | ORAL | Status: DC
  Administered 2023-12-25 – 2023-12-27 (×8): 80 mg via ORAL
  Filled 2023-12-25 (×8): qty 1

## 2023-12-25 MED ORDER — FERROUS SULFATE 325 (65 FE) MG PO TABS
325.0000 mg | ORAL_TABLET | Freq: Every day | ORAL | Status: DC
  Administered 2023-12-26 – 2023-12-27 (×2): 325 mg via ORAL
  Filled 2023-12-25 (×2): qty 1

## 2023-12-25 MED ORDER — WITCH HAZEL-GLYCERIN EX PADS: 1.0000 | MEDICATED_PAD | CUTANEOUS | Status: DC | PRN

## 2023-12-25 MED ORDER — IBUPROFEN 600 MG PO TABS
600.0000 mg | ORAL_TABLET | Freq: Four times a day (QID) | ORAL | Status: DC
  Administered 2023-12-25 – 2023-12-27 (×8): 600 mg via ORAL
  Filled 2023-12-25 (×8): qty 1

## 2023-12-25 MED ORDER — HYDROMORPHONE HCL 1 MG/ML IJ SOLN: 0.2000 mg | INTRAMUSCULAR | Status: DC | PRN

## 2023-12-25 NOTE — Lactation Note (Addendum)
 This note was copied from a baby's chart. Lactation Consultation Note  Patient Name: Kristen Santos [id number] Date: 12/25/2023 Age:37 hours Reason for consult: Initial assessment;Term Baby hadn't been to interested in latching. Mom had given some formula. Mom getting up for v/s.  Baby awake. Baby took formula well. Baby has been slightly spity this evening. LC cleared mouth and gave some formula per mom,  Mom is very sleepy and hungry. Can't hardly hold her eyes open.  Newborn feeding habits, STS, importance of I&O, and supplementing w/BF. Mom encouraged to feed baby 8-12 times/24 hours and with feeding cues.  Baby has a tiny mouth. LC assessed mom's nipples and it looks like the baby will be able to latch fine.  Baby has been trying to spit up. LC cleared mouth w/bulb syringe.  Mom stated when she feels better we can try to latch the baby to come back. Encouraged to call for assistance when needed. Maternal Data Does the patient have breastfeeding experience prior to this delivery?: Yes How long did the patient breastfeed?: days to week  Feeding Nipple Type: Regular  LATCH Score Latch: Too sleepy or reluctant, no latch achieved, no sucking elicited.  Audible Swallowing: None  Type of Nipple: Everted at rest and after stimulation  Comfort (Breast/Nipple): Soft / non-tender  Hold (Positioning): Assistance needed to correctly position infant at breast and maintain latch.  LATCH Score: 5   Lactation Tools Discussed/Used    Interventions Interventions: Breast feeding basics reviewed;Skin to skin;Education;Pace feeding;LC Services brochure  Discharge    Consult Status Consult Status: Follow-up Date: 12/25/23 Follow-up type: In-patient    Charyl Dancer 12/25/2023, 4:25 AM

## 2023-12-25 NOTE — Progress Notes (Signed)
 Per shift change report, IV infiltrated and removed and IV team consult ordered. IV team up to floor, this RN wants to discuss IV need with MD first. Consult d/c'd. Spoke with Dr. Imogene Burn. Okay for now to not restick for new IV. If pt unable to void 6 hours post-foley removal (2300), replace foley for overnight.
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
## 2023-12-25 NOTE — Progress Notes (Addendum)
 Postpartum Progress Note  S: No complaints. Feeling well. Lochia appropriate. No subjective fevers/chills, Cp, or SOB. Not yet ambulating. Foley catheter remains in place.  O:     12/25/2023    2:30 AM 12/25/2023    1:21 AM 12/25/2023   12:12 AM  Vitals with BMI  Systolic 112 110 782  Diastolic 68 61 73  Pulse 91 87 86    Gen: NAD, A&O Pulm: NWOB Abd: soft, appropriately ttp, fundus firm and below Umb. Incision under pressure dressing, no strikethrough. Ext: No evidence of DVT, 2+ pitting edema b/l  UOP adequate.   Labs Recent Results (from the past 2160 hours)  OB RESULT CONSOLE Group B Strep     Status: None   Collection Time: 11/25/23 12:00 AM  Result Value Ref Range   GBS Negative   CBC     Status: Abnormal   Collection Time: 12/24/23 10:35 AM  Result Value Ref Range   WBC 5.6 4.0 - 10.5 K/uL   RBC 3.71 (L) 3.87 - 5.11 MIL/uL   Hemoglobin 11.1 (L) 12.0 - 15.0 g/dL   HCT 95.6 (L) 21.3 - 08.6 %   MCV 89.2 80.0 - 100.0 fL   MCH 29.9 26.0 - 34.0 pg   MCHC 33.5 30.0 - 36.0 g/dL   RDW 57.8 46.9 - 62.9 %   Platelets 340 150 - 400 K/uL   nRBC 0.0 0.0 - 0.2 %    Comment: Performed at [facility], [address] [place], [place] [address]  Type and screen [facility]     Status: None   Collection Time: 12/24/23 10:35 AM  Result Value Ref Range   ABO/RH(D) O POS    Antibody Screen NEG    Sample Expiration      12/27/2023,2359 Performed at [facility], [address] [place], [place] [address]   CBC     Status: Abnormal   Collection Time: 12/25/23  4:15 AM  Result Value Ref Range   WBC 12.7 (H) 4.0 - 10.5 K/uL   RBC 3.22 (L) 3.87 - 5.11 MIL/uL   Hemoglobin 9.5 (L) 12.0 - 15.0 g/dL   HCT 10.2 (L) 72.5 - 36.6 %   MCV 88.2 80.0 - 100.0 fL   MCH 29.5 26.0 - 34.0 pg   MCHC 33.5 30.0 - 36.0 g/dL   RDW 44.0 34.7 - 42.5 %   Platelets 309 150 - 400 K/uL   nRBC 0.0 0.0 - 0.2 %    Comment: Performed at [facility], [address],  [place], [place] [address]   A/P:  POD1 s/p pCS, doing well pp. AFVSS. Benign exam. Acute blood loss anemia, clinically insignificant - no symptoms. Will start oral iron. Remove pressure dressing today. Continue present care.  Jule Economy, MD

## 2023-12-25 NOTE — Anesthesia Postprocedure Evaluation (Signed)
 Anesthesia Post Note  Patient: Kristen Santos  Procedure(s) Performed: CESAREAN DELIVERY (Abdomen)     Patient location during evaluation: PACU Anesthesia Type: Epidural Level of consciousness: awake Pain management: pain level controlled Vital Signs Assessment: post-procedure vital signs reviewed and stable Respiratory status: spontaneous breathing, nonlabored ventilation and respiratory function stable Cardiovascular status: blood pressure returned to baseline and stable Postop Assessment: no apparent nausea or vomiting Anesthetic complications: no   No notable events documented.  Last Vitals:  Vitals:   12/25/23 0230 12/25/23 0410  BP: 112/68   Pulse: 91   Resp: 18 18  Temp: 36.6 C 36.5 C  SpO2: 98% 97%    Last Pain:  Vitals:   12/25/23 0554  TempSrc:   PainSc: 0-No pain   Pain Goal: Patients Stated Pain Goal: 10 (12/24/23 1154)                 Jonathen Rathman P Aaniyah Strohm

## 2023-12-26 ENCOUNTER — Encounter (HOSPITAL_COMMUNITY): Payer: Self-pay | Admitting: Obstetrics and Gynecology

## 2023-12-26 LAB — GC/CHLAMYDIA PROBE AMP (~~LOC~~) NOT AT ARMC
Chlamydia: NEGATIVE
Comment: NEGATIVE
Comment: NORMAL
Neisseria Gonorrhea: NEGATIVE

## 2023-12-26 NOTE — Progress Notes (Signed)
 Postpartum Progress Note  S: No complaints. Feeling well. Lochia appropriate. No subjective fevers/chills, Cp, or SOB. Amubulating. VF. Swelling improving.   O:     12/26/2023    5:09 AM 12/25/2023    9:11 PM 12/25/2023    4:10 PM  Vitals with BMI  Systolic 101 129 161  Diastolic 67 60 80  Pulse 85 87 80    Gen: NAD, A&O Pulm: NWOB Abd: soft, appropriately ttp, fundus firm and below Umb. Incision c/d/i, no strikethrough. Ext: No evidence of DVT, 1+ pitting edema b/l  UOP adequate.   Labs Recent Results (from the past 2160 hours)  OB RESULT CONSOLE Group B Strep     Status: None   Collection Time: 11/25/23 12:00 AM  Result Value Ref Range   GBS Negative   CBC     Status: Abnormal   Collection Time: 12/24/23 10:35 AM  Result Value Ref Range   WBC 5.6 4.0 - 10.5 K/uL   RBC 3.71 (L) 3.87 - 5.11 MIL/uL   Hemoglobin 11.1 (L) 12.0 - 15.0 g/dL   HCT 09.6 (L) 04.5 - 40.9 %   MCV 89.2 80.0 - 100.0 fL   MCH 29.9 26.0 - 34.0 pg   MCHC 33.5 30.0 - 36.0 g/dL   RDW 81.1 91.4 - 78.2 %   Platelets 340 150 - 400 K/uL   nRBC 0.0 0.0 - 0.2 %    Comment: Performed at [facility], [address] [place], [place] [address]  Type and screen [facility]     Status: None   Collection Time: 12/24/23 10:35 AM  Result Value Ref Range   ABO/RH(D) O POS    Antibody Screen NEG    Sample Expiration      12/27/2023,2359 Performed at [facility], [address] [place], [place] [address]   RPR     Status: None   Collection Time: 12/24/23 10:35 AM  Result Value Ref Range   RPR Ser Ql NON REACTIVE NON REACTIVE    Comment: Performed at [facility], [address], [place], [place] [address]  CBC     Status: Abnormal   Collection Time: 12/25/23  4:15 AM  Result Value Ref Range   WBC 12.7 (H) 4.0 - 10.5 K/uL   RBC 3.22 (L) 3.87 - 5.11 MIL/uL   Hemoglobin 9.5 (L) 12.0 - 15.0 g/dL   HCT 62.9 (L) 52.8 - 41.3 %   MCV 88.2 80.0 - 100.0 fL   MCH 29.5 26.0 - 34.0  pg   MCHC 33.5 30.0 - 36.0 g/dL   RDW 24.4 01.0 - 27.2 %   Platelets 309 150 - 400 K/uL   nRBC 0.0 0.0 - 0.2 %    Comment: Performed at [facility], [address], [place], [place] [address]  Basic metabolic panel     Status: Abnormal   Collection Time: 12/25/23  5:19 PM  Result Value Ref Range   Sodium 133 (L) 135 - 145 mmol/L   Potassium 4.1 3.5 - 5.1 mmol/L   Chloride 105 98 - 111 mmol/L   CO2 21 (L) 22 - 32 mmol/L   Glucose, Bld 134 (H) 70 - 99 mg/dL    Comment: Glucose reference range applies only to samples taken after fasting for at least 8 hours.   BUN 14 6 - 20 mg/dL   Creatinine, Ser 4.03 0.44 - 1.00 mg/dL   Calcium 8.9 8.9 - 47.4 mg/dL  GFR, Estimated >60 >60 mL/min    Comment: (NOTE) Calculated using the CKD-EPI Creatinine Equation (2021)    Anion gap 7 5 - 15    Comment: Performed at [facility], [address], [place], [place] [address]   A/P:  POD2 s/p pCS, doing well pp. AFVSS. Benign exam. Acute blood loss anemia, clinically insignificant - no symptoms. Cont oral iron. Continue present care.  Belva Agee, MD

## 2023-12-26 NOTE — Lactation Note (Signed)
 This note was copied from a baby's chart. Lactation Consultation Note  Patient Name: Kristen Santos [id number] Date: 12/26/2023 Age:37 hours Reason for consult: Follow-up assessment;Term Mom stated she has difficulty latching baby and she thinks she is just going to give the baby her BM. Mom stated she has some pumps at home. LC offered to set up DEBP here at hospital. Mom agreed. Mom shown how to use DEBP & how to disassemble, clean, & reassemble parts. Milk storage reviewed. Asked mom if she is ready to pump mom stated she is going to wait until tomorrow that she is very tired. Asked mom if she had any questions right now mom stated no. Encouraged to call for assistance as needed. Reported to RN of mom's plan.  Maternal Data    Feeding Mother's Current Feeding Choice: Breast Milk and Formula Nipple Type: Slow - flow  LATCH Score       Type of Nipple: Flat  Comfort (Breast/Nipple): Soft / non-tender         Lactation Tools Discussed/Used Tools: Pump;Flanges Flange Size: 18 Breast pump type: Double-Electric Breast Pump Pump Education: Setup, frequency, and cleaning;Milk Storage Reason for Pumping: mom decided to pump and bottle feed.  Interventions Interventions: DEBP;Education  Discharge    Consult Status Consult Status: Follow-up Date: 12/26/23 Follow-up type: In-patient    Kristen Santos, Diamond Nickel 12/26/2023, 12:07 AM

## 2023-12-26 NOTE — Lactation Note (Signed)
 This note was copied from a baby's chart. Lactation Consultation Note  Patient Name: Kristen Santos [id number] Date: 12/26/2023 Age:37 hours Reason for consult: Follow-up assessment;Exclusive pumping and bottle feeding;Term;Infant weight loss  P3- MOB reports that infant has been bottle feeding well since the SLP had assessed infant and provided them with the Dr. Theora Gianotti Preemie nipple. MOB also reported to [facility] that she has decided to no longer put infant to the breast. She wants to exclusively pump only. MOB had already been set up with the hospital DEBP with size 18 mm flanges. LC offered to assist with pumping, but MOB declined due to being in too much pain at her incision site at the moment. Instead, LC reviewed how to use the DEBP. LC then offered to provide coconut oil, but MOB again declined at this time. LC reviewed day 2 cluster feeding, feeding infant on cue 8-12x in 24 hrs, not allowing infant to go over 3 hrs without a feeding, CDC milk storage guidelines, LC services handout and engorgement/breast care. LC encouraged MOB to call for further assistance as needed.  Maternal Data Has patient been taught Hand Expression?: Yes Does the patient have breastfeeding experience prior to this delivery?: Yes  Feeding Mother's Current Feeding Choice: Breast Milk and Formula Nipple Type: Dr. Lorne Skeens  Lactation Tools Discussed/Used Tools: Pump;Flanges Flange Size: 18 Breast pump type: Double-Electric Breast Pump;Manual Pump Education: Setup, frequency, and cleaning;Milk Storage Reason for Pumping: decided to exclusively pump Pumping frequency: 15-20 min every 3 hrs  Interventions Interventions: Breast feeding basics reviewed;Hand pump;DEBP;Education;LC Services brochure  Discharge Discharge Education: Engorgement and breast care;Warning signs for feeding baby Pump: Hands Free;Personal  Consult Status Consult Status: Follow-up Date: 12/27/23 Follow-up type:  In-patient    Dema Severin BS, IBCLC 12/26/2023, 5:08 PM

## 2023-12-27 MED ORDER — OXYCODONE HCL 5 MG PO TABS: 5.0000 mg | ORAL_TABLET | ORAL | 0 refills | Status: DC | PRN

## 2023-12-27 NOTE — Progress Notes (Signed)
 Mom and infant discharged off unit, escorted by nursing staff.

## 2023-12-27 NOTE — Discharge Summary (Signed)
 Postpartum Discharge Summary  Date of Service December 27, 2023     Patient Name: Kristen Santos DOB: [birth year] MRN: [id number]  Date of admission: 12/24/2023 Delivery date:12/24/2023 Delivering provider: Tawni Levy Date of discharge: 12/27/2023  Admitting diagnosis: Pregnancy [Z34.90] Intrauterine pregnancy: [redacted]w[redacted]d     Secondary diagnosis:  Principal Problem:   Pregnancy  Additional problems: none     Discharge diagnosis: Term Pregnancy Delivered                                              Post partum procedures: not applicable Augmentation: Pitocin and Cytotec Complications: None  Hospital course: Induction of Labor With Cesarean Section   38 y.o. yo [phone] at [redacted]w[redacted]d was admitted to the hospital 12/24/2023 for induction of labor. Patient had a labor course significant for  nothing. The patient went for cesarean section due to fetal distress. . Delivery details are as follows: Membrane Rupture Time/Date: 7:02 PM,12/24/2023  Delivery Method:C-Section, Low Transverse Operative Delivery:N/A Details of operation can be found in separate operative Note.  Patient had a postpartum course complicated by nothing. She is ambulating, tolerating a regular diet, passing flatus, and urinating well.  Patient is discharged home in stable condition on 12/27/23.      Newborn Data: Birth date:12/24/2023 Birth time:9:45 PM Gender:Female Living status:Living Apgars:7 ,9  Weight:3350 g                               Magnesium Sulfate received: No BMZ received: No Rhophylac:N/A MMR:N/A T-DaP:Given prenatally Flu: N/A RSV Vaccine received: No Transfusion:No Immunizations administered: Immunization History  Administered Date(s) Administered   Influenza,inj,Quad PF,6+ Mos 06/08/2016   Tdap 08/31/2016    Physical exam  Vitals:   12/26/23 0509 12/26/23 1303 12/26/23 2100 12/27/23 0550  BP: 101/67 122/78 120/76 120/84  Pulse: 85 90 92 85  Resp: 18 18 18 18   Temp: 97.8 F (36.6 C) (!) 97.5  F (36.4 C) (!) 97.4 F (36.3 C) 97.6 F (36.4 C)  TempSrc: Oral Oral Oral Oral  SpO2: 100% 98% 98% 100%  Weight:      Height:       General: alert, cooperative, and no distress Lochia: appropriate Uterine Fundus: firm Incision: Healing well with no significant drainage DVT Evaluation: No evidence of DVT seen on physical exam. Labs: Lab Results  Component Value Date   WBC 12.7 (H) 12/25/2023   HGB 9.5 (L) 12/25/2023   HCT 28.4 (L) 12/25/2023   MCV 88.2 12/25/2023   PLT 309 12/25/2023      Latest Ref Rng & Units 12/25/2023    5:19 PM  CMP  Glucose 70 - 99 mg/dL 147   BUN 6 - 20 mg/dL 14   Creatinine 8.29 - 1.00 mg/dL 5.62   Sodium 130 - 865 mmol/L 133   Potassium 3.5 - 5.1 mmol/L 4.1   Chloride 98 - 111 mmol/L 105   CO2 22 - 32 mmol/L 21   Calcium 8.9 - 10.3 mg/dL 8.9    Edinburgh Score:    12/25/2023    4:10 PM  Edinburgh Postnatal Depression Scale Screening Tool  I have been able to laugh and see the funny side of things. 0  I have looked forward with enjoyment to things. 0  I have blamed myself unnecessarily when  things went wrong. 1  I have been anxious or worried for no good reason. 0  I have felt scared or panicky for no good reason. 0  Things have been getting on top of me. 0  I have been so unhappy that I have had difficulty sleeping. 0  I have felt sad or miserable. 1  I have been so unhappy that I have been crying. 0  The thought of harming myself has occurred to me. 0  Edinburgh Postnatal Depression Scale Total 2      After visit meds:  Allergies as of 12/27/2023       Reactions   Penicillins Shortness Of Breath, Itching, Other (See Comments)   Has patient had a PCN reaction causing immediate rash, facial/tongue/throat swelling, SOB or lightheadedness with hypotension: Yes Has patient had a PCN reaction causing severe rash involving mucus membranes or skin necrosis: No Has patient had a PCN reaction that required hospitalization No Has patient had a  PCN reaction occurring within the last 10 years: Yes If all of the above answers are "NO", then may proceed with Cephalosporin use.        Medication List     STOP taking these medications    Diclegis 10-10 MG Tbec Generic drug: Doxylamine-Pyridoxine   prenatal multivitamin Tabs tablet       TAKE these medications    oxyCODONE 5 MG immediate release tablet Commonly known as: Oxy IR/ROXICODONE Take 1-2 tablets (5-10 mg total) by mouth every 4 (four) hours as needed for moderate pain (pain score 4-6).         Discharge home in stable condition Infant Feeding: Breast Infant Disposition:home with mother Discharge instruction: per After Visit Summary and Postpartum booklet. Activity: Advance as tolerated. Pelvic rest for 6 weeks.  Diet: routine diet Anticipated Birth Control: Unsure Postpartum Appointment:6 weeks Additional Postpartum F/U:  not applicable Future Appointments:No future appointments. Follow up Visit:      12/27/2023 Jeani Hawking, MD

## 2023-12-27 NOTE — Lactation Note (Signed)
 This note was copied from a baby's chart. Lactation Consultation Note  Patient Name: Kristen Santos Date: 12/27/2023 Age:37 hours Reason for consult: Follow-up assessment;Exclusive pumping and bottle feeding  P3, Mother states she plans to pump once home and baby is being formula fed.  Recommend mother pump q 3 hours for 15 min.  Mother denies questions or concerns or needing assistance at this time. Reviewed engorgement care and monitoring voids/stools.  Maternal Data Has patient been taught Hand Expression?: Yes  Feeding Mother's Current Feeding Choice: Formula Nipple Type: Slow - flow   Lactation Tools Discussed/Used Tools: Pump  Interventions Interventions: Education  Discharge Discharge Education: Engorgement and breast care;Warning signs for feeding baby Pump: Personal  Consult Status Consult Status: Complete Date: 12/27/23   Dahlia Byes [facility] 12/27/2023, 10:40 AM

## 2024-01-03 ENCOUNTER — Telehealth (HOSPITAL_COMMUNITY): Payer: Self-pay | Admitting: *Deleted

## 2024-01-03 NOTE — Telephone Encounter (Signed)
 01/03/2024  Name: Kristen Santos MRN: [id number] DOB: August 22, 1986  Reason for Call:  Transition of Care Hospital Discharge Call  Contact Status: Patient Contact Status: Complete  Language assistant needed:          Follow-Up Questions: Do You Have Any Concerns About Your Health As You Heal From Delivery?: No Do You Have Any Concerns About Your Infants Health?: No Patient stated, "The pharmacist told me not to take my pain medication if I'm breastfeeding." Patient reported that infant would not latch while she was in the hospital so she has been bottle feeding, but now patient has a desire to breastfeed. RN instructed patient to contact her OB to ask them if her current medication is safe to take while breastfeeding, and if not, to obtain a new prescription. Patient asked, "It's not too late for me to breastfeed?" RN told patient that she is unable to answer that question, but will gladly send her the warm-line number for the lactation consultants and the scheduling number for the outpatient lactation consultant who call all help her. Patient agreed. Phone numbers emailed to patient.  Edinburgh Postnatal Depression Scale:  In the Past 7 Days:   Patient declined EPDS. Stated, "That's not necessary. Nothing has changed, and I'm happy." PHQ2-9 Depression Scale:     Discharge Follow-up: Edinburgh score requires follow up?: N/A Patient was advised of the following resources:: Breastfeeding Support Group, Support Group  Post-discharge interventions: Reviewed Newborn Safe Sleep Practices  Signature Julien Odor, RN, 01/03/24, [phone]

## 2024-01-05 ENCOUNTER — Inpatient Hospital Stay (HOSPITAL_COMMUNITY)

## 2024-02-02 DIAGNOSIS — Z1389 Encounter for screening for other disorder: Secondary | ICD-10-CM | POA: Diagnosis not present

## 2024-02-23 DIAGNOSIS — D649 Anemia, unspecified: Secondary | ICD-10-CM | POA: Diagnosis not present

## 2024-02-23 DIAGNOSIS — Z3042 Encounter for surveillance of injectable contraceptive: Secondary | ICD-10-CM | POA: Diagnosis not present

## 2024-03-19 DIAGNOSIS — D649 Anemia, unspecified: Secondary | ICD-10-CM | POA: Diagnosis not present

## 2024-05-11 ENCOUNTER — Encounter (HOSPITAL_COMMUNITY): Payer: Self-pay

## 2024-05-11 ENCOUNTER — Other Ambulatory Visit: Payer: Self-pay

## 2024-05-11 ENCOUNTER — Emergency Department (HOSPITAL_COMMUNITY)
Admission: EM | Admit: 2024-05-11 | Discharge: 2024-05-11 | Disposition: A | Discharge status: Home / Self Care | Source: Home / Self Care | Attending: Emergency Medicine | Admitting: Emergency Medicine

## 2024-05-11 DIAGNOSIS — Z8249 Family history of ischemic heart disease and other diseases of the circulatory system: Secondary | ICD-10-CM | POA: Diagnosis not present

## 2024-05-11 DIAGNOSIS — L03116 Cellulitis of left lower limb: Secondary | ICD-10-CM | POA: Diagnosis not present

## 2024-05-11 DIAGNOSIS — W182XXA Fall in (into) shower or empty bathtub, initial encounter: Secondary | ICD-10-CM | POA: Insufficient documentation

## 2024-05-11 DIAGNOSIS — Y93E1 Activity, personal bathing and showering: Secondary | ICD-10-CM | POA: Diagnosis not present

## 2024-05-11 DIAGNOSIS — Z880 Allergy status to penicillin: Secondary | ICD-10-CM | POA: Diagnosis not present

## 2024-05-11 DIAGNOSIS — S8012XA Contusion of left lower leg, initial encounter: Secondary | ICD-10-CM | POA: Diagnosis not present

## 2024-05-11 DIAGNOSIS — Y92002 Bathroom of unspecified non-institutional (private) residence single-family (private) house as the place of occurrence of the external cause: Secondary | ICD-10-CM | POA: Diagnosis not present

## 2024-05-11 DIAGNOSIS — Z1152 Encounter for screening for COVID-19: Secondary | ICD-10-CM | POA: Diagnosis not present

## 2024-05-11 DIAGNOSIS — E66811 Obesity, class 1: Secondary | ICD-10-CM | POA: Diagnosis not present

## 2024-05-11 DIAGNOSIS — T148XXA Other injury of unspecified body region, initial encounter: Secondary | ICD-10-CM

## 2024-05-11 DIAGNOSIS — Z87440 Personal history of urinary (tract) infections: Secondary | ICD-10-CM | POA: Diagnosis not present

## 2024-05-11 DIAGNOSIS — E871 Hypo-osmolality and hyponatremia: Secondary | ICD-10-CM | POA: Diagnosis not present

## 2024-05-11 DIAGNOSIS — A4189 Other specified sepsis: Secondary | ICD-10-CM | POA: Diagnosis not present

## 2024-05-11 DIAGNOSIS — A419 Sepsis, unspecified organism: Secondary | ICD-10-CM | POA: Diagnosis not present

## 2024-05-11 DIAGNOSIS — Z6833 Body mass index (BMI) 33.0-33.9, adult: Secondary | ICD-10-CM | POA: Diagnosis not present

## 2024-05-11 DIAGNOSIS — Z0389 Encounter for observation for other suspected diseases and conditions ruled out: Secondary | ICD-10-CM | POA: Diagnosis not present

## 2024-05-11 DIAGNOSIS — S8002XA Contusion of left knee, initial encounter: Secondary | ICD-10-CM | POA: Diagnosis not present

## 2024-05-11 DIAGNOSIS — Z841 Family history of disorders of kidney and ureter: Secondary | ICD-10-CM | POA: Diagnosis not present

## 2024-05-11 DIAGNOSIS — W182XXS Fall in (into) shower or empty bathtub, sequela: Secondary | ICD-10-CM | POA: Diagnosis not present

## 2024-05-11 MED ORDER — KETOROLAC TROMETHAMINE 15 MG/ML IJ SOLN
15.0000 mg | Freq: Once | INTRAMUSCULAR | Status: AC
  Administered 2024-05-11: 15 mg via INTRAMUSCULAR
  Filled 2024-05-11: qty 1

## 2024-05-11 NOTE — Discharge Instructions (Signed)
 The most common thing would be a collection of blood under the skin.  This tends to resolve on its own.  He can keep it wrapped for comfort.  If this just got worse then you could try ice 15 minutes on 15 minutes off.  Tylenol  and ibuprofen  or naproxen  tend to help the best.  Please help with your doctor in the office.  As we discussed please return for rapid swelling fever.  Take 4 over the counter ibuprofen  tablets 3 times a day or 2 over-the-counter naproxen  tablets twice a day for pain. Also take tylenol  1000mg (2 extra strength) four times a day.

## 2024-05-11 NOTE — ED Triage Notes (Signed)
 Pt c.o left knee pain that radiates to her calf s.p fall in the shower yesterday. Redness and swelling noted

## 2024-05-11 NOTE — ED Provider Notes (Signed)
 [place] EMERGENCY DEPARTMENT AT [facility] Provider Note   CSN: [id number] Arrival date & time: 05/11/24  9084     Patient presents with: Knee Pain   Kristen Santos is a 38 y.o. female.   38 yo F with a cc of leg pain and swelling.  The patient slipped and fell in the bathtub a few days ago.  She bumped her leg really hard against the side of the tub.  Noticed some increased redness and swelling this morning and went here to be evaluated.  She has been able to ambulate without issue.  No obvious pain at the PE.  Did hurt her right knee a little bit but that is been feeling better.   Knee Pain      Prior to Admission medications   Medication Sig Start Date End Date Taking? Authorizing Provider  oxyCODONE  (OXY IR/ROXICODONE ) 5 MG immediate release tablet Take 1-2 tablets (5-10 mg total) by mouth every 4 (four) hours as needed for moderate pain (pain score 4-6). 12/27/23   Mat Browning, MD    Allergies: Penicillins    Review of Systems  Updated Vital Signs BP 126/75 (BP Location: Left Arm)   Pulse (!) 102   Temp 98.5 F (36.9 C) (Oral)   Resp 20   Ht 5' 5 (1.651 m)   Wt 90.7 kg   LMP 04/23/2024 (Approximate)   SpO2 98%   Breastfeeding No   BMI 33.28 kg/m   Physical Exam Vitals and nursing note reviewed.  Constitutional:      General: She is not in acute distress.    Appearance: She is well-developed. She is not diaphoretic.  HENT:     Head: Normocephalic and atraumatic.  Eyes:     Pupils: Pupils are equal, round, and reactive to light.  Cardiovascular:     Rate and Rhythm: Normal rate and regular rhythm.     Heart sounds: No murmur heard.    No friction rub. No gallop.  Pulmonary:     Effort: Pulmonary effort is normal.     Breath sounds: No wheezing or rales.  Abdominal:     General: There is no distension.     Palpations: Abdomen is soft.     Tenderness: There is no abdominal tenderness.  Musculoskeletal:        General:  Tenderness present.     Cervical back: Normal range of motion and neck supple.     Comments: The patient has some erythema and edema locally to the left medial calf just adjacent to the tibia.  There is a palpable hematoma.  Pulse motor and sensation intact distally.  Skin:    General: Skin is warm and dry.  Neurological:     Mental Status: She is alert and oriented to person, place, and time.  Psychiatric:        Behavior: Behavior normal.     (all labs ordered are listed, but only abnormal results are displayed) Labs Reviewed - No data to display  EKG: None  Radiology: No results found.   Procedures   Medications Ordered in the ED  ketorolac  (TORADOL ) 15 MG/ML injection 15 mg (15 mg Intramuscular Given 05/11/24 0944)                                    Medical Decision Making Risk Prescription drug management.   38 yo F with a chief complaints  of leg pain and swelling after a blunt trauma a couple days ago.  Clinically the patient has a hematoma.  I discussed local care at home with her.  Will have her follow-up with her family doctor in the office.  9:48 AM:  I have discussed the diagnosis/risks/treatment options with the patient.  Evaluation and diagnostic testing in the emergency department does not suggest an emergent condition requiring admission or immediate intervention beyond what has been performed at this time.  They will follow up with PCP. We also discussed returning to the ED immediately if new or worsening sx occur. We discussed the sx which are most concerning (e.g., sudden worsening pain, fever, inability to tolerate by mouth) that necessitate immediate return. Medications administered to the patient during their visit and any new prescriptions provided to the patient are listed below.  Medications given during this visit Medications  ketorolac  (TORADOL ) 15 MG/ML injection 15 mg (15 mg Intramuscular Given 05/11/24 0944)     The patient appears reasonably  screen and/or stabilized for discharge and I doubt any other medical condition or other [facility] requiring further screening, evaluation, or treatment in the ED at this time prior to discharge.       Final diagnoses:  Hematoma    ED Discharge Orders     None          Emil Share, [place] 05/11/24 [phone]

## 2024-05-12 ENCOUNTER — Other Ambulatory Visit: Payer: Self-pay

## 2024-05-12 ENCOUNTER — Emergency Department (HOSPITAL_BASED_OUTPATIENT_CLINIC_OR_DEPARTMENT_OTHER)

## 2024-05-12 ENCOUNTER — Encounter (HOSPITAL_BASED_OUTPATIENT_CLINIC_OR_DEPARTMENT_OTHER): Payer: Self-pay

## 2024-05-12 ENCOUNTER — Inpatient Hospital Stay (HOSPITAL_BASED_OUTPATIENT_CLINIC_OR_DEPARTMENT_OTHER)
Admission: EM | Admit: 2024-05-12 | Discharge: 2024-05-15 | DRG: 872 | Disposition: A | Discharge status: Home / Self Care | Attending: Internal Medicine | Admitting: Internal Medicine

## 2024-05-12 DIAGNOSIS — Y93E1 Activity, personal bathing and showering: Secondary | ICD-10-CM

## 2024-05-12 DIAGNOSIS — R000 Tachycardia, unspecified: Secondary | ICD-10-CM | POA: Diagnosis not present

## 2024-05-12 DIAGNOSIS — Z1152 Encounter for screening for COVID-19: Secondary | ICD-10-CM | POA: Diagnosis not present

## 2024-05-12 DIAGNOSIS — Z8249 Family history of ischemic heart disease and other diseases of the circulatory system: Secondary | ICD-10-CM

## 2024-05-12 DIAGNOSIS — Z8719 Personal history of other diseases of the digestive system: Secondary | ICD-10-CM | POA: Insufficient documentation

## 2024-05-12 DIAGNOSIS — W182XXS Fall in (into) shower or empty bathtub, sequela: Secondary | ICD-10-CM | POA: Diagnosis not present

## 2024-05-12 DIAGNOSIS — E871 Hypo-osmolality and hyponatremia: Secondary | ICD-10-CM | POA: Diagnosis present

## 2024-05-12 DIAGNOSIS — E66811 Obesity, class 1: Secondary | ICD-10-CM | POA: Diagnosis present

## 2024-05-12 DIAGNOSIS — L03116 Cellulitis of left lower limb: Principal | ICD-10-CM | POA: Diagnosis present

## 2024-05-12 DIAGNOSIS — A4189 Other specified sepsis: Secondary | ICD-10-CM | POA: Diagnosis not present

## 2024-05-12 DIAGNOSIS — Z0389 Encounter for observation for other suspected diseases and conditions ruled out: Secondary | ICD-10-CM | POA: Diagnosis not present

## 2024-05-12 DIAGNOSIS — Z880 Allergy status to penicillin: Secondary | ICD-10-CM

## 2024-05-12 DIAGNOSIS — L0390 Cellulitis, unspecified: Secondary | ICD-10-CM

## 2024-05-12 DIAGNOSIS — A419 Sepsis, unspecified organism: Principal | ICD-10-CM | POA: Diagnosis present

## 2024-05-12 DIAGNOSIS — Z6833 Body mass index (BMI) 33.0-33.9, adult: Secondary | ICD-10-CM | POA: Diagnosis not present

## 2024-05-12 DIAGNOSIS — Z841 Family history of disorders of kidney and ureter: Secondary | ICD-10-CM | POA: Diagnosis not present

## 2024-05-12 DIAGNOSIS — M7989 Other specified soft tissue disorders: Secondary | ICD-10-CM | POA: Diagnosis not present

## 2024-05-12 DIAGNOSIS — Z87440 Personal history of urinary (tract) infections: Secondary | ICD-10-CM

## 2024-05-12 DIAGNOSIS — Y92002 Bathroom of unspecified non-institutional (private) residence single-family (private) house as the place of occurrence of the external cause: Secondary | ICD-10-CM

## 2024-05-12 LAB — URINALYSIS, W/ REFLEX TO CULTURE (INFECTION SUSPECTED)
Bilirubin Urine: NEGATIVE
Glucose, UA: NEGATIVE mg/dL
Ketones, ur: NEGATIVE mg/dL
Nitrite: NEGATIVE
Protein, ur: 30 mg/dL — AB
Specific Gravity, Urine: 1.02 (ref 1.005–1.030)
pH: 6 (ref 5.0–8.0)

## 2024-05-12 LAB — CBC WITH DIFFERENTIAL/PLATELET
Abs Immature Granulocytes: 0.06 K/uL (ref 0.00–0.07)
Basophils Absolute: 0 K/uL (ref 0.0–0.1)
Basophils Relative: 0 %
Eosinophils Absolute: 0 K/uL (ref 0.0–0.5)
Eosinophils Relative: 0 %
HCT: 31.9 % — ABNORMAL LOW (ref 36.0–46.0)
Hemoglobin: 10.8 g/dL — ABNORMAL LOW (ref 12.0–15.0)
Immature Granulocytes: 1 %
Lymphocytes Relative: 12 %
Lymphs Abs: 1.5 K/uL (ref 0.7–4.0)
MCH: 28 pg (ref 26.0–34.0)
MCHC: 33.9 g/dL (ref 30.0–36.0)
MCV: 82.6 fL (ref 80.0–100.0)
Monocytes Absolute: 0.4 K/uL (ref 0.1–1.0)
Monocytes Relative: 3 %
Neutro Abs: 10.9 K/uL — ABNORMAL HIGH (ref 1.7–7.7)
Neutrophils Relative %: 84 %
Platelets: 288 K/uL (ref 150–400)
RBC: 3.86 MIL/uL — ABNORMAL LOW (ref 3.87–5.11)
RDW: 16.3 % — ABNORMAL HIGH (ref 11.5–15.5)
WBC: 12.9 K/uL — ABNORMAL HIGH (ref 4.0–10.5)
nRBC: 0 % (ref 0.0–0.2)

## 2024-05-12 LAB — COMPREHENSIVE METABOLIC PANEL WITH GFR
ALT: 13 U/L (ref 0–44)
AST: 13 U/L — ABNORMAL LOW (ref 15–41)
Albumin: 3.7 g/dL (ref 3.5–5.0)
Alkaline Phosphatase: 67 U/L (ref 38–126)
Anion gap: 11 (ref 5–15)
BUN: 6 mg/dL (ref 6–20)
CO2: 21 mmol/L — ABNORMAL LOW (ref 22–32)
Calcium: 8.8 mg/dL — ABNORMAL LOW (ref 8.9–10.3)
Chloride: 100 mmol/L (ref 98–111)
Creatinine, Ser: 0.74 mg/dL (ref 0.44–1.00)
GFR, Estimated: >60 mL/min (ref >60)
Glucose, Bld: 113 mg/dL — ABNORMAL HIGH (ref 70–99)
Potassium: 3.5 mmol/L (ref 3.5–5.1)
Sodium: 132 mmol/L — ABNORMAL LOW (ref 135–145)
Total Bilirubin: 0.5 mg/dL (ref 0.0–1.2)
Total Protein: 7 g/dL (ref 6.5–8.1)

## 2024-05-12 LAB — LACTIC ACID, PLASMA: Lactic Acid, Venous: 0.9 mmol/L (ref 0.5–1.9)

## 2024-05-12 LAB — MAGNESIUM: Magnesium: 1.7 mg/dL (ref 1.7–2.4)

## 2024-05-12 LAB — RESP PANEL BY RT-PCR (RSV, FLU A&B, COVID)  RVPGX2
Influenza A by PCR: NEGATIVE
Influenza B by PCR: NEGATIVE
Resp Syncytial Virus by PCR: NEGATIVE
SARS Coronavirus 2 by RT PCR: NEGATIVE

## 2024-05-12 LAB — PHOSPHORUS: Phosphorus: 1.7 mg/dL — ABNORMAL LOW (ref 2.5–4.6)

## 2024-05-12 LAB — PROTIME-INR
INR: 1.2 (ref 0.8–1.2)
Prothrombin Time: 16.3 s — ABNORMAL HIGH (ref 11.4–15.2)

## 2024-05-12 LAB — PREGNANCY, URINE: Preg Test, Ur: NEGATIVE

## 2024-05-12 MED ORDER — CEFAZOLIN SODIUM-DEXTROSE 2-4 GM/100ML-% IV SOLN
2.0000 g | Freq: Three times a day (TID) | INTRAVENOUS | Status: DC
  Administered 2024-05-12 – 2024-05-15 (×9): 2 g via INTRAVENOUS
  Filled 2024-05-12 (×9): qty 100

## 2024-05-12 MED ORDER — ACETAMINOPHEN 650 MG RE SUPP: 650.0000 mg | Freq: Four times a day (QID) | RECTAL | Status: DC | PRN

## 2024-05-12 MED ORDER — ONDANSETRON HCL 4 MG/2ML IJ SOLN: 4.0000 mg | Freq: Four times a day (QID) | INTRAMUSCULAR | Status: DC | PRN

## 2024-05-12 MED ORDER — ONDANSETRON HCL 4 MG PO TABS: 4.0000 mg | ORAL_TABLET | Freq: Four times a day (QID) | ORAL | Status: DC | PRN

## 2024-05-12 MED ORDER — CLINDAMYCIN PHOSPHATE 900 MG/50ML IV SOLN: 900.0000 mg | Freq: Three times a day (TID) | INTRAVENOUS | Status: DC

## 2024-05-12 MED ORDER — CLINDAMYCIN PHOSPHATE 600 MG/50ML IV SOLN
600.0000 mg | Freq: Once | INTRAVENOUS | Status: AC
  Administered 2024-05-12: 600 mg via INTRAVENOUS
  Filled 2024-05-12: qty 50

## 2024-05-12 MED ORDER — ACETAMINOPHEN 500 MG PO TABS
1000.0000 mg | ORAL_TABLET | Freq: Once | ORAL | Status: AC
  Administered 2024-05-12: 1000 mg via ORAL
  Filled 2024-05-12: qty 2

## 2024-05-12 MED ORDER — K PHOS MONO-SOD PHOS DI & MONO 155-852-130 MG PO TABS
500.0000 mg | ORAL_TABLET | Freq: Four times a day (QID) | ORAL | Status: AC
  Administered 2024-05-12 – 2024-05-13 (×4): 500 mg via ORAL
  Filled 2024-05-12 (×4): qty 2

## 2024-05-12 MED ORDER — KETOROLAC TROMETHAMINE 30 MG/ML IJ SOLN
30.0000 mg | Freq: Four times a day (QID) | INTRAMUSCULAR | Status: AC | PRN
  Administered 2024-05-12: 30 mg via INTRAVENOUS
  Filled 2024-05-12: qty 1

## 2024-05-12 MED ORDER — ACETAMINOPHEN 325 MG PO TABS
650.0000 mg | ORAL_TABLET | Freq: Four times a day (QID) | ORAL | Status: DC | PRN
  Administered 2024-05-13 – 2024-05-14 (×2): 650 mg via ORAL
  Filled 2024-05-12 (×2): qty 2

## 2024-05-12 MED ORDER — LACTATED RINGERS IV SOLN: INTRAVENOUS | Status: DC

## 2024-05-12 MED ORDER — LACTATED RINGERS IV BOLUS (SEPSIS)
1000.0000 mL | Freq: Once | INTRAVENOUS | Status: AC
  Administered 2024-05-12: 1000 mL via INTRAVENOUS

## 2024-05-12 MED ORDER — ENOXAPARIN SODIUM 40 MG/0.4ML IJ SOSY
40.0000 mg | PREFILLED_SYRINGE | INTRAMUSCULAR | Status: DC
  Administered 2024-05-12 – 2024-05-14 (×3): 40 mg via SUBCUTANEOUS
  Filled 2024-05-12 (×3): qty 0.4

## 2024-05-12 NOTE — Plan of Care (Signed)

## 2024-05-12 NOTE — H&P (Signed)
 History and Physical    Patient: Kristen Santos FMW:[id number] DOB: 28-Aug-1986 DOA: 05/12/2024 DOS: the patient was seen and examined on 05/12/2024 PCP: Lucius Krabbe, NP  Patient coming from: Home  Chief Complaint:  Chief Complaint  Patient presents with   Leg Pain   HPI: Kristen Santos is a 38 y.o. female with medical history significant of Chlamydia trachomatis and trichomonal vaginitis during pregnancy, influenza, ileus, history of bowel obstruction, history of UTI, class III obesity who two days ago had a fall in her shower injuring her left knee developed pain tenderness, edema and erythema.  She was seen at [facility] yesterday given ketorolac  and discharged home.  She returned to med [facility] ED due to fever, chills, fatigue, malaise and was found to be tachycardic.  She also reports having a single episode of diarrhea, but no abdominal pain, nausea or emesis.  No melena or hematochezia. Denied rhinorrhea, sore throat, wheezing or hemoptysis.  No chest pain, palpitations, diaphoresis, PND, orthopnea or pitting edema of the lower extremities.   No flank pain, dysuria, frequency or hematuria.  No polyuria, polydipsia, polyphagia or blurred vision.   Lab work: Urinalysis trace hemoglobin, trace leukocyte esterase, protein of 30 mg/dL and a few bacteria microscopic examination.  Urine pregnancy test was negative.  Coronavirus, influenza and RSV PCR test was negative.  CBC showed a white count of 12.9, hemoglobin 10.8 g/dL platelets 711.  PT 16.3 and INR 1.2.  Lactic acid was normal.  CMP shows sodium 132, potassium 3.5, chloride 100 and CO2 21 mmol/L with a normal anion gap.  Calcium level is normal after correction.  Glucose 113 mg/dL and AST 13 units/L.  Renal function and the rest of the LFTs were normal.  Magnesium was 1.7 and phosphorus 1.7 mg/dL.  Imaging: Portable 1 view chest radiograph with no active disease.   ED course: Initial vital signs  were temperature 100.7 F, pulse 97, respiration 19, BP 106/60 mmHg O2 sat 97% on room air.  Patient received acetaminophen  1000 mg p.o., clindamycin  600 mg IVPB and LR 1000 mL bolus followed by LR at 150 mL/h.  Review of Systems: As mentioned in the history of present illness. All other systems reviewed and are negative. Past Medical History:  Diagnosis Date   Chlamydia trachomatis infection in mother during third trimester of pregnancy 05/18/2016   TOC 9/19- neg  TOC 10/25/16: positive @34  wks   H/O influenza 10/25/2016   Ileus (HCC)    Post-dates pregnancy 12/11/2016   Screening for STD (sexually transmitted disease) 12/09/2016   Supervision of normal pregnancy, antepartum 06/06/2016     Clinic [facility] Prenatal Labs  Dating LMP c/w 10w sono Blood type: O/Positive/-- (09/19 1452) O pos  Genetic Screen Quad: negative      Antibody:Negative (09/19 1452)Neg  Anatomic US   suboptimal  Completed @36  wks;normal; female; EFW: 54% Rubella: 6.36 (09/19 1452)Immune  GTT Third trimester: 82/116/70 RPR: Non Reactive (12/12 1050)   Flu vaccine 06/08/2016 HBsAg: Negative (09/19 1452) neg  TDaP v   Trichomonal vaginitis during pregnancy in third trimester 11/29/2016   @38  weeks.     UTI (lower urinary tract infection)    Past Surgical History:  Procedure Laterality Date   ABDOMINAL SURGERY     CESAREAN SECTION N/A 12/24/2023   Procedure: CESAREAN DELIVERY;  Surgeon: Laurence Slater PARAS, MD;  Location: [facility] LD ORS;  Service: Obstetrics;  Laterality: N/A;   Social History:  reports that she has never smoked. She  has never used smokeless tobacco. She reports that she does not currently use drugs after having used the following drugs: Marijuana. She reports that she does not drink alcohol.  Allergies  Allergen Reactions   Penicillins Shortness Of Breath, Itching and Other (See Comments)    Has patient had a PCN reaction causing immediate rash, facial/tongue/throat swelling, SOB or lightheadedness with hypotension:  Yes Has patient had a PCN reaction causing severe rash involving mucus membranes or skin necrosis: No Has patient had a PCN reaction that required hospitalization No Has patient had a PCN reaction occurring within the last 10 years: Yes If all of the above answers are NO, then may proceed with Cephalosporin use.    Family History  Problem Relation Age of Onset   Hypertension Mother    Kidney disease Father     Prior to Admission medications   Medication Sig Start Date End Date Taking? Authorizing Provider  oxyCODONE  (OXY IR/ROXICODONE ) 5 MG immediate release tablet Take 1-2 tablets (5-10 mg total) by mouth every 4 (four) hours as needed for moderate pain (pain score 4-6). 12/27/23   Mat Browning, MD    Physical Exam: Vitals:   05/12/24 0915 05/12/24 0930 05/12/24 0945 05/12/24 1310  BP: 118/84 114/62 111/69 124/72  Pulse: 100 98 99 97  Resp:    18  Temp:    99.4 F (37.4 C)  TempSrc:    Oral  SpO2: 97% 97% 98% 99%  Weight:      Height:       Physical Exam Vitals reviewed.  Constitutional:      General: She is awake. She is not in acute distress.    Appearance: She is obese. She is ill-appearing.  HENT:     Head: Normocephalic.     Nose: No rhinorrhea.     Mouth/Throat:     Mouth: Mucous membranes are moist.  Eyes:     General: No scleral icterus.    Pupils: Pupils are equal, round, and reactive to light.  Neck:     Vascular: No JVD.  Cardiovascular:     Rate and Rhythm: Normal rate and regular rhythm.     Heart sounds: S1 normal and S2 normal.  Pulmonary:     Effort: Pulmonary effort is normal.     Breath sounds: Normal breath sounds. No wheezing, rhonchi or rales.  Abdominal:     General: Bowel sounds are normal.     Palpations: Abdomen is soft.     Tenderness: There is no abdominal tenderness. There is no right CVA tenderness or left CVA tenderness.  Musculoskeletal:     Cervical back: Neck supple.     Right lower leg: No edema.     Left lower leg: No  edema.  Skin:    General: Skin is warm and dry.  Neurological:     General: No focal deficit present.     Mental Status: She is alert and oriented to person, place, and time.  Psychiatric:        Mood and Affect: Mood normal.        Behavior: Behavior normal. Behavior is cooperative.     Data Reviewed:  Results are pending, will review when available.  EKG:  Vent. rate 94 BPM PR interval 143 ms QRS duration 76 ms QT/QTcB 348/436 ms P-R-T axes 70 20 46 Sinus rhythm Low voltage, precordial leads  Assessment and Plan: Principal Problem:   Sepsis due to cellulitis (HCC) Admit to MedSurg/inpatient. Continue IV fluids. Has  been on cephalexin  in the past. Begin cefazolin  2 g IVPB every 8 hours. Analgesics as needed. Check left lower extremity Doppler. Follow-up blood culture and sensitivity Follow CBC and CMP in a.m.  Active Problems:   Hypophosphatemia Replacing. Follow-up level as needed.    Class 1 obesity Current BMI 33.28 kg/m. Would benefit from lifestyle modifications. Follow-up closely with PCP.     Advance Care Planning:   Code Status: Full Code   Consults:   Family Communication:   Severity of Illness: The appropriate patient status for this patient is INPATIENT. Inpatient status is judged to be reasonable and necessary in order to provide the required intensity of service to ensure the patient's safety. The patient's presenting symptoms, physical exam findings, and initial radiographic and laboratory data in the context of their chronic comorbidities is felt to place them at high risk for further clinical deterioration. Furthermore, it is not anticipated that the patient will be medically stable for discharge from the hospital within 2 midnights of admission.   * I certify that at the point of admission it is my clinical judgment that the patient will require inpatient hospital care spanning beyond 2 midnights from the point of admission due to high  intensity of service, high risk for further deterioration and high frequency of surveillance required.*  Author: Alm Dorn Castor, MD 05/12/2024 1:41 PM  For on call review [email or web address].   This document was prepared using Dragon voice recognition software and may contain some unintended transcription errors.

## 2024-05-12 NOTE — Progress Notes (Signed)
 Plan of Care Note for accepted transfer   Patient: Kristen Santos MRN: [id number]   DOA: 05/12/2024  Facility [facility].  Requesting Provider: Rolan Quale, DO. Reason for transfer: Cellulitis of LLE. Facility course:  The patient received a dose of clindamycin  600 mg IVPB, LR 1000 mL bolus and acetaminophen  1000 mg po x1.   Plan of care: The patient is accepted for admission to Med-surg  unit, at [facility].  Author: Alm Dorn Castor, MD 05/12/2024  Check [email or web address] for on-call coverage.  Nursing staff, Please call [facility] Admits & Consults System-Wide number on Amion as soon as patient's arrival, so appropriate admitting provider can evaluate the pt.

## 2024-05-12 NOTE — ED Notes (Signed)
 Pt advised on Tuesday she was stepping in to the bath tub and her bath mat slipped, causing her to scrape her L knee down the side of the tub. The pt advised it hurt for a few days but on Friday night her knee and entire lower leg began to swell. She woke up today and it's red down the extremity and tender to the touch. The pt denied numbness or tingling but findings seem consistent with celluitis of the extremity.

## 2024-05-12 NOTE — ED Provider Notes (Signed)
 [facility] Provider Note   CSN: [id number] Arrival date & time: 05/12/24  9143     Patient presents with: Leg Pain  HPI Kristen Santos is a 38 y.o. female presenting for left leg pain.  She states that on Tuesday she slipped and fell out of the bathtub landing on her left leg.  She was evaluated at [facility] yesterday.  Found to have a hematoma about the left lower leg just below the knee joint.  She returns today because the swelling pain and now there is some discoloration of her lower leg that she noticed this morning.  Denies any new trauma.  Also does have a fever here and is tachycardic.  She denies urinary symptoms, cough and fever, does report an occurrence of diarrhea but no vomiting or abdominal pain.  Past Medical History:  Diagnosis Date   Chlamydia trachomatis infection in mother during third trimester of pregnancy 05/18/2016   TOC 9/19- neg  TOC 10/25/16: positive @34  wks   H/O influenza 10/25/2016   Ileus (HCC)    Post-dates pregnancy 12/11/2016   Screening for STD (sexually transmitted disease) 12/09/2016   Supervision of normal pregnancy, antepartum 06/06/2016     Clinic [facility] Prenatal Labs  Dating LMP c/w 10w sono Blood type: O/Positive/-- (09/19 1452) O pos  Genetic Screen Quad: negative      Antibody:Negative (09/19 1452)Neg  Anatomic US   suboptimal  Completed @36  wks;normal; female; EFW: 54% Rubella: 6.36 (09/19 1452)Immune  GTT Third trimester: 82/116/70 RPR: Non Reactive (12/12 1050)   Flu vaccine 06/08/2016 HBsAg: Negative (09/19 1452) neg  TDaP v   Trichomonal vaginitis during pregnancy in third trimester 11/29/2016   @38  weeks.     UTI (lower urinary tract infection)        Leg Pain      Prior to Admission medications   Medication Sig Start Date End Date Taking? Authorizing Provider  oxyCODONE  (OXY IR/ROXICODONE ) 5 MG immediate release tablet Take 1-2 tablets (5-10 mg total) by mouth every 4  (four) hours as needed for moderate pain (pain score 4-6). 12/27/23   Mat Browning, MD    Allergies: Penicillins    Review of Systems See HPI  Updated Vital Signs BP 111/69   Pulse 99   Temp (!) 100.7 F (38.2 C) (Oral)   Resp 19   Ht 5' 5 (1.651 m)   Wt 90.7 kg   LMP 04/23/2024 (Approximate)   SpO2 98%   BMI 33.28 kg/m   Physical Exam Vitals and nursing note reviewed.  HENT:     Head: Normocephalic and atraumatic.     Mouth/Throat:     Mouth: Mucous membranes are moist.  Eyes:     General:        Right eye: No discharge.        Left eye: No discharge.     Conjunctiva/sclera: Conjunctivae normal.  Cardiovascular:     Rate and Rhythm: Regular rhythm. Tachycardia present.     Pulses: Normal pulses.          Dorsalis pedis pulses are 2+ on the right side and 2+ on the left side.     Heart sounds: Normal heart sounds.  Pulmonary:     Effort: Pulmonary effort is normal.     Breath sounds: Normal breath sounds.  Abdominal:     General: Abdomen is flat.     Palpations: Abdomen is soft.  Musculoskeletal:     Comments: [employer]  sized area of induration noted about the medial aspect of the left lower leg just below the knee with surrounding erythema and edema that extends down to the ankle.  It is hot to touch.  Skin:    General: Skin is warm and dry.  Neurological:     General: No focal deficit present.  Psychiatric:        Mood and Affect: Mood normal.     (all labs ordered are listed, but only abnormal results are displayed) Labs Reviewed  COMPREHENSIVE METABOLIC PANEL WITH GFR - Abnormal; Notable for the following components:      Result Value   Sodium 132 (*)    CO2 21 (*)    Glucose, Bld 113 (*)    Calcium 8.8 (*)    AST 13 (*)    All other components within normal limits  CBC WITH DIFFERENTIAL/PLATELET - Abnormal; Notable for the following components:   WBC 12.9 (*)    RBC 3.86 (*)    Hemoglobin 10.8 (*)    HCT 31.9 (*)    RDW 16.3 (*)    Neutro  Abs 10.9 (*)    All other components within normal limits  PROTIME-INR - Abnormal; Notable for the following components:   Prothrombin Time 16.3 (*)    All other components within normal limits  URINALYSIS, W/ REFLEX TO CULTURE (INFECTION SUSPECTED) - Abnormal; Notable for the following components:   Hgb urine dipstick TRACE (*)    Protein, ur 30 (*)    Leukocytes,Ua TRACE (*)    Bacteria, UA FEW (*)    All other components within normal limits  RESP PANEL BY RT-PCR (RSV, FLU A&B, COVID)  [id number]  CULTURE, BLOOD (ROUTINE X 2)  CULTURE, BLOOD (ROUTINE X 2)  LACTIC ACID, PLASMA  PREGNANCY, URINE  MAGNESIUM  PHOSPHORUS    EKG: EKG Interpretation Date/Time:  Saturday May 12 2024 09:52:12 EDT Ventricular Rate:  94 PR Interval:  143 QRS Duration:  76 QT Interval:  348 QTC Calculation: 436 R Axis:   20  Text Interpretation: Sinus rhythm Low voltage, precordial leads Confirmed by Neysa Clap [phone]) on 05/12/2024 10:54:22 AM  Radiology: [facility] Chest Port 1 View Result Date: 05/12/2024 CLINICAL DATA:  Questionable sepsis. EXAM: PORTABLE CHEST 1 VIEW COMPARISON:  03/05/2021 FINDINGS: The lungs are clear without focal pneumonia, edema, pneumothorax or pleural effusion. The cardiopericardial silhouette is within normal limits for size. No acute bony abnormality. IMPRESSION: No active disease. Electronically Signed   By: Camellia Candle M.D.   On: 05/12/2024 10:34     .Critical Care  Performed by: Lang Norleen POUR, PA-C Authorized by: Lang Norleen POUR, PA-C   Critical care provider statement:    Critical care time (minutes):  30   Critical care was necessary to treat or prevent imminent or life-threatening deterioration of the following conditions:  Sepsis (likely secondary to cellulitis)   Critical care was time spent personally by me on the following activities:  Development of treatment plan with patient or surrogate, discussions with consultants, evaluation of patient's response to  treatment, examination of patient, ordering and review of laboratory studies, ordering and review of radiographic studies, ordering and performing treatments and interventions, pulse oximetry, re-evaluation of patient's condition and review of old charts    Medications Ordered in the ED  lactated ringers  infusion (has no administration in time range)  lactated ringers  bolus 1,000 mL (1,000 mLs Intravenous New Bag/Given 05/12/24 0956)  acetaminophen  (TYLENOL ) tablet 1,000 mg (1,000 mg Oral Given  05/12/24 9047)  clindamycin  (CLEOCIN ) IVPB 600 mg (0 mg Intravenous Stopped 05/12/24 1120)                                    Medical Decision Making Amount and/or Complexity of Data Reviewed Labs: ordered. Radiology: ordered.  Risk OTC drugs. Prescription drug management. Decision regarding hospitalization.   Initial Impression and Ddx 38 year old well-appearing female presenting for leg pain.  Initially tachycardic and febrile.  Exam showing left lower leg induration erythema edema.  Findings concerning for cellulitis but cannot rule out DVT at this time.  Initiated sepsis protocol.  Started on IV clindamycin .  DDx includes cellulitis, sepsis, DVT, PAD, other. Patient PMH that increases complexity of ED encounter: Recent fall  Interpretation of Diagnostics - I independent reviewed and interpreted the labs as followed: Leukocytosis, mild anemia  - I independently visualized the following imaging with scope of interpretation limited to determining acute life threatening conditions related to emergency care: CXR, which revealed no active disease  -I personally reviewed and interpreted EKG which revealed sinus rhythm  Patient Reassessment and Ultimate Disposition/Management Heart rate did improve with IV fluids.  Admitted to hospital service with sepsis likely secondary to cellulitis.  Advised she may need a DVT study of the left leg as well.  Admitted to hospital service with Dr. Alm Castor.   At this time she is well-appearing, hemodynamically stable in no acute distress.  Patient management required discussion with the following services or consulting groups:  Hospitalist Service  Complexity of Problems Addressed Acute complicated illness or Injury  Additional Data Reviewed and Analyzed Further history obtained from: Past medical history and medications listed in the EMR and Prior ED visit notes  Patient Encounter Risk Assessment Consideration of hospitalization      Final diagnoses:  Cellulitis of left lower extremity    ED Discharge Orders     None          Lang Norleen POUR, PA-C 05/12/24 1132    Mannie Pac T, DO 05/13/24 1546

## 2024-05-12 NOTE — ED Notes (Signed)
Urine specimen in lab 

## 2024-05-12 NOTE — ED Triage Notes (Signed)
 C/o left leg pain that has been worsening. States pain is in calf. Had fall the other day. Seen at [facility] yesterday.

## 2024-05-12 NOTE — ED Notes (Signed)
 Provider advised he's okay with single large bore IV.

## 2024-05-12 NOTE — ED Notes (Signed)
 Called [facility] for transport.

## 2024-05-13 ENCOUNTER — Inpatient Hospital Stay (HOSPITAL_COMMUNITY)

## 2024-05-13 DIAGNOSIS — L0390 Cellulitis, unspecified: Secondary | ICD-10-CM | POA: Diagnosis not present

## 2024-05-13 DIAGNOSIS — M7989 Other specified soft tissue disorders: Secondary | ICD-10-CM

## 2024-05-13 LAB — HEMOGLOBIN A1C
Hgb A1c MFr Bld: 5.7 % — ABNORMAL HIGH (ref 4.8–5.6)
Mean Plasma Glucose: 116.89 mg/dL

## 2024-05-13 LAB — HIV ANTIBODY (ROUTINE TESTING W REFLEX): HIV Screen 4th Generation wRfx: NONREACTIVE

## 2024-05-13 NOTE — Progress Notes (Signed)
 PROGRESS NOTE  Kristen Santos FMW:[id number] DOB: 1986-03-16 DOA: 05/12/2024 PCP: Lucius Krabbe, NP   LOS: 1 day   Brief narrative:  Kristen Santos is a 38 y.o. female with medical history significant for class III obesity presented to hospital with tenderness edema and redness of her left knee area after sustaining a fall in her shower 2 days prior to presentation.  Patient was initially at the [facility] and [facility] and was given symptomatic treatment and sent home but then again presented to [facility] with fever, chills, fatigue malaise and was found to be tachycardic.  Initial vitals were notable for temperature of 100.7 F with a respiratory rate of 19.  Labs were notable for bacteriuria.  COVID influenza and RSV was negative.  CBC showed mild leukocytosis with WBC of 12.9.  BMP showed potassium of 3.5.  Lactate was within normal range.  Magnesium was 1.7 and phosphorus of 1.7.  Chest x-ray did not show any infiltrate.  Urinalysis was negative for infection.  Patient received clindamycin  and IV fluid bolus and was considered for admission to the hospital for further evaluation and treatment.      Assessment/Plan: Principal Problem:   Sepsis due to cellulitis [facility]) Active Problems:   Hypophosphatemia   Class 1 obesity  Sepsis due to left lower extremity cellulitis Patient met sepsis criteria with fever, tachypnea, leukocytosis and left lower extremity cellulitis.   On IV cefazolin .  Check lower extremity duplex to rule out DVT.SABRA  Blood cultures negative in less than 24 hours.  Temperature max of 99.7 F.  Will mark the area for progression of the disease.  Elevation of the lower extremity advised to the patient.  Will add hemoglobin A1c    Hypophosphatemia Replenished.  Check levels in a.m.  Mild hyponatremia.  Sodium of 132.  Will continue to monitor.     Class 1 obesity Body mass index is 33.28 kg/m. Patient would benefit from lifestyle  modification.  DVT prophylaxis: enoxaparin  (LOVENOX ) injection 40 mg Start: 05/12/24 2200   Disposition: Home likely in 2 days or so  Status is: Inpatient Remains inpatient appropriate because: IV antibiotic, pending clinical improvement    Code Status:     Code Status: Full Code  Family Communication: None at bedside  Consultants: None  Procedures: None  Anti-infectives:  Cefazolin  IV  Anti-infectives (From admission, onward)    Start     Dose/Rate Route Frequency Ordered Stop   05/12/24 1530  ceFAZolin  (ANCEF ) IVPB 2g/100 mL premix        2 g 200 mL/hr over 30 Minutes Intravenous Every 8 hours 05/12/24 1436     05/12/24 1500  clindamycin  (CLEOCIN ) IVPB 900 mg  Status:  Discontinued        900 mg 100 mL/hr over 30 Minutes Intravenous Every 8 hours 05/12/24 1404 05/12/24 1436   05/12/24 1000  clindamycin  (CLEOCIN ) IVPB 600 mg        600 mg 100 mL/hr over 30 Minutes Intravenous  Once 05/12/24 0959 05/12/24 1120        Subjective: Today, patient was seen and examined at bedside.  Patient complains of redness pain and swelling of the left lower extremity.  Had fever and chills at home.  Denies any shortness of breath dyspnea cough congestion or changes in her bowel habits.  Objective: Vitals:   05/12/24 2222 05/13/24 0333  BP: 116/71 130/76  Pulse: 91 87  Resp: 19 17  Temp: 98.4 F (36.9 C) 99.7  F (37.6 C)  SpO2: 100% 100%    Intake/Output Summary (Last 24 hours) at 05/13/2024 0929 Last data filed at 05/12/2024 1602 Gross per 24 hour  Intake 1455 ml  Output --  Net 1455 ml   Filed Weights   05/12/24 0910  Weight: 90.7 kg   Body mass index is 33.28 kg/m.   Physical Exam:  GENERAL: Patient is alert awake and oriented. Not in obvious distress.  Obese built HENT: No scleral pallor or icterus. Pupils equally reactive to light. Oral mucosa is moist NECK: is supple, no gross swelling noted. CHEST: Clear to auscultation. No crackles or wheezes.  CVS:  S1 and S2 heard, no murmur. Regular rate and rhythm.  ABDOMEN: Soft, non-tender, bowel sounds are present. EXTREMITIES: Left lower extremity with edema erythema induration and tenderness of the leg with some erythema extending up to the thigh.  No open wound noted. CNS: Cranial nerves are intact. No focal motor deficits. SKIN: warm and dry, left lower extremity with erythema edema and tenderness.  Pictures taken 05/13/2024   Data Review: I have personally reviewed the following laboratory data and studies,  CBC: Recent Labs  Lab 05/12/24 0934  WBC 12.9*  NEUTROABS 10.9*  HGB 10.8*  HCT 31.9*  MCV 82.6  PLT 288   Basic Metabolic Panel: Recent Labs  Lab 05/12/24 0933 05/12/24 0934  NA  --  132*  K  --  3.5  CL  --  100  CO2  --  21*  GLUCOSE  --  113*  BUN  --  6  CREATININE  --  0.74  CALCIUM  --  8.8*  MG 1.7  --   PHOS 1.7*  --    Liver Function Tests: Recent Labs  Lab 05/12/24 0934  AST 13*  ALT 13  ALKPHOS 67  BILITOT 0.5  PROT 7.0  ALBUMIN 3.7   No results for input(s): LIPASE, AMYLASE in the last 168 hours. No results for input(s): AMMONIA in the last 168 hours. Cardiac Enzymes: No results for input(s): CKTOTAL, CKMB, CKMBINDEX, TROPONINI in the last 168 hours. BNP (last 3 results) No results for input(s): BNP in the last 8760 hours.  ProBNP (last 3 results) No results for input(s): PROBNP in the last 8760 hours.  CBG: No results for input(s): GLUCAP in the last 168 hours. Recent Results (from the past 240 hours)  Resp panel by RT-PCR (RSV, Flu A&B, Covid) Anterior Nasal Swab     Status: None   Collection Time: 05/12/24  9:34 AM   Specimen: Anterior Nasal Swab  Result Value Ref Range Status   SARS Coronavirus 2 by RT PCR NEGATIVE NEGATIVE Final    Comment: (NOTE) SARS-CoV-2 target nucleic acids are NOT DETECTED.  The SARS-CoV-2 RNA is generally detectable in upper respiratory specimens during the acute phase of infection.  The lowest concentration of SARS-CoV-2 viral copies this assay can detect is 138 copies/mL. A negative result does not preclude SARS-Cov-2 infection and should not be used as the sole basis for treatment or other patient management decisions. A negative result may occur with  improper specimen collection/handling, submission of specimen other than nasopharyngeal swab, presence of viral mutation(s) within the areas targeted by this assay, and inadequate number of viral copies(<138 copies/mL). A negative result must be combined with clinical observations, patient history, and epidemiological information. The expected result is Negative.  Fact Sheet for Patients:  BloggerCourse.com  Fact Sheet for Healthcare Providers:  SeriousBroker.it  This test is no t  yet approved or cleared by the [place]  FDA and  has been authorized for detection and/or diagnosis of SARS-CoV-2 by FDA under an Emergency Use Authorization (EUA). This EUA will remain  in effect (meaning this test can be used) for the duration of the COVID-19 declaration under Section 564(b)(1) of the Act, 21 U.S.C.section 360bbb-3(b)(1), unless the authorization is terminated  or revoked sooner.       Influenza A by PCR NEGATIVE NEGATIVE Final   Influenza B by PCR NEGATIVE NEGATIVE Final    Comment: (NOTE) The Xpert Xpress SARS-CoV-2/FLU/RSV plus assay is intended as an aid in the diagnosis of influenza from Nasopharyngeal swab specimens and should not be used as a sole basis for treatment. Nasal washings and aspirates are unacceptable for Xpert Xpress SARS-CoV-2/FLU/RSV testing.  Fact Sheet for Patients: BloggerCourse.com  Fact Sheet for Healthcare Providers: SeriousBroker.it  This test is not yet approved or cleared by the [place]  FDA and has been authorized for detection and/or diagnosis of SARS-CoV-2 by FDA  under an Emergency Use Authorization (EUA). This EUA will remain in effect (meaning this test can be used) for the duration of the COVID-19 declaration under Section 564(b)(1) of the Act, 21 U.S.C. section 360bbb-3(b)(1), unless the authorization is terminated or revoked.     Resp Syncytial Virus by PCR NEGATIVE NEGATIVE Final    Comment: (NOTE) Fact Sheet for Patients: BloggerCourse.com  Fact Sheet for Healthcare Providers: SeriousBroker.it  This test is not yet approved or cleared by the [place]  FDA and has been authorized for detection and/or diagnosis of SARS-CoV-2 by FDA under an Emergency Use Authorization (EUA). This EUA will remain in effect (meaning this test can be used) for the duration of the COVID-19 declaration under Section 564(b)(1) of the Act, 21 U.S.C. section 360bbb-3(b)(1), unless the authorization is terminated or revoked.  Performed at [facility], [address] [place], [place] [address]   Blood Culture (routine x 2)     Status: None (Preliminary result)   Collection Time: 05/12/24  9:34 AM   Specimen: BLOOD RIGHT FOREARM  Result Value Ref Range Status   Specimen Description   Final    BLOOD RIGHT FOREARM Performed at [facility], [address] [place], [place] [address]    Special Requests   Final    BOTTLES DRAWN AEROBIC AND ANAEROBIC Blood Culture adequate volume Performed at [facility], [address] [place], [place] [address]    Culture   Final    NO GROWTH < 24 HOURS Performed at [facility], [address], [place], [place] [address]    Report Status PENDING  Incomplete  Blood Culture (routine x 2)     Status: None (Preliminary result)   Collection Time: 05/12/24  9:34 AM   Specimen: BLOOD  Result Value Ref Range Status   Specimen Description   Final    BLOOD RIGHT ANTECUBITAL Performed at [facility], [address]  [place], [place] [address]    Special Requests   Final    BOTTLES DRAWN AEROBIC AND ANAEROBIC Blood Culture adequate volume Performed at [facility], [address] [place], [place] [address]    Culture   Final    NO GROWTH < 24 HOURS Performed at [facility], [address], [place], [place] [address]    Report Status PENDING  Incomplete     Studies: DG Chest Port 1  View Result Date: 05/12/2024 CLINICAL DATA:  Questionable sepsis. EXAM: PORTABLE CHEST 1 VIEW COMPARISON:  03/05/2021 FINDINGS: The lungs are clear without focal pneumonia, edema, pneumothorax or pleural effusion. The cardiopericardial silhouette is within normal limits for size. No acute bony abnormality. IMPRESSION: No active disease. Electronically Signed   By: Camellia Candle M.D.   On: 05/12/2024 10:34      Kristen Alstrom, MD  [facility] 05/13/2024  If 7PM-7AM, please contact night-coverage

## 2024-05-13 NOTE — Plan of Care (Signed)

## 2024-05-13 NOTE — Progress Notes (Signed)
   05/13/24 1029  TOC Brief Assessment  Insurance and Status Reviewed  Patient has primary care physician Yes  Home environment has been reviewed Apartment  Prior level of function: Independent  Prior/Current Home Services No current home services  Social Drivers of Health Review SDOH reviewed no interventions necessary  Readmission risk has been reviewed Yes  Transition of care needs no transition of care needs at this time    Signed: Heather Saltness, MSW, LCSW Clinical Social Worker Inpatient Care Management 05/13/2024 10:29 AM

## 2024-05-13 NOTE — Hospital Course (Signed)
 Kristen Santos is a 38 y.o. female with medical history significant for class III obesity presented to hospital with tenderness edema and redness of her left knee area after sustaining a fall in her shower 2 days prior to presentation.  Patient was initially at the [facility] and [facility] and was given symptomatic treatment and sent home but then again presented to [facility] with fever, chills, fatigue malaise and was found to be tachycardic.  Initial vitals were notable for temperature of 100.7 F with a respiratory rate of 19.  Labs were notable for bacteriuria.  COVID influenza and RSV was negative.  CBC showed mild leukocytosis with WBC of 12.9.  BMP showed potassium of 3.5.  Lactate was within normal range.  Magnesium was 1.7 and phosphorus of 1.7.  Chest x-ray did not show any infiltrate.  Urinalysis was negative for infection.  Patient received clindamycin  and IV fluid bolus and was considered for admission to the hospital for further evaluation and treatment.  Sepsis due to cellulitis Patient met sepsis criteria with fever, tachypnea, leukocytosis and left knee area cellulitis.  Failed outpatient treatment with cephalexin  in the past.  On cefazolin .  Check lower extremity duplex.  Follow-up blood culture and sensitivity.    Hypophosphatemia Replenished.  Check levels in a.m.  Mild hyponatremia.  Sodium of 132.  Will continue to monitor.     Class 1 obesity Body mass index is 33.28 kg/m.  Patient would benefit from lifestyle modification.

## 2024-05-13 NOTE — Progress Notes (Signed)
 LLE venous exam is completed. Rebbeca Sheperd, RVT

## 2024-05-14 DIAGNOSIS — L0390 Cellulitis, unspecified: Secondary | ICD-10-CM | POA: Diagnosis not present

## 2024-05-14 DIAGNOSIS — A419 Sepsis, unspecified organism: Secondary | ICD-10-CM | POA: Diagnosis not present

## 2024-05-14 LAB — CBC
HCT: 31.5 % — ABNORMAL LOW (ref 36.0–46.0)
Hemoglobin: 9.9 g/dL — ABNORMAL LOW (ref 12.0–15.0)
MCH: 27 pg (ref 26.0–34.0)
MCHC: 31.4 g/dL (ref 30.0–36.0)
MCV: 86.1 fL (ref 80.0–100.0)
Platelets: 267 K/uL (ref 150–400)
RBC: 3.66 MIL/uL — ABNORMAL LOW (ref 3.87–5.11)
RDW: 16.1 % — ABNORMAL HIGH (ref 11.5–15.5)
WBC: 6.4 K/uL (ref 4.0–10.5)
nRBC: 0 % (ref 0.0–0.2)

## 2024-05-14 LAB — BASIC METABOLIC PANEL WITH GFR
Anion gap: 7 (ref 5–15)
BUN: 8 mg/dL (ref 6–20)
CO2: 22 mmol/L (ref 22–32)
Calcium: 8.4 mg/dL — ABNORMAL LOW (ref 8.9–10.3)
Chloride: 107 mmol/L (ref 98–111)
Creatinine, Ser: 0.59 mg/dL (ref 0.44–1.00)
GFR, Estimated: >60 mL/min (ref >60)
Glucose, Bld: 97 mg/dL (ref 70–99)
Potassium: 3.5 mmol/L (ref 3.5–5.1)
Sodium: 136 mmol/L (ref 135–145)

## 2024-05-14 LAB — MAGNESIUM: Magnesium: 2 mg/dL (ref 1.7–2.4)

## 2024-05-14 LAB — PHOSPHORUS: Phosphorus: 3.9 mg/dL (ref 2.5–4.6)

## 2024-05-14 MED ORDER — OXYCODONE HCL 5 MG PO TABS
5.0000 mg | ORAL_TABLET | Freq: Four times a day (QID) | ORAL | Status: DC | PRN
  Administered 2024-05-14: 5 mg via ORAL
  Filled 2024-05-14: qty 1

## 2024-05-14 NOTE — Plan of Care (Signed)

## 2024-05-14 NOTE — TOC Initial Note (Signed)
 Transition of Care [facility]) - Initial/Assessment Note    Patient Details  Name: Kristen Santos MRN: [id number] Date of Birth: 13-Jun-1986  Transition of Care [facility]) CM/SW Contact:    Doneta Glenys DASEN, RN Phone Number: 05/14/2024, 12:26 PM  Clinical Narrative:                 Presented for left knee pain that radiates to her calf s.p fall in the shower. Patient states PTA lives in a house with grandmother, uncle and 2 minor children; Verified PCP/insurance;Denies DME, HH, oxygen, and SDOH needs; Patient states that her sister will transport home at discharge. IP case management will follow progression to discharge for PT recommendations.  Expected Discharge Plan: Home/Self Care Barriers to Discharge: Continued Medical Work up   Patient Goals and CMS Choice Patient states their goals for this hospitalization and ongoing recovery are:: Home with grandmother, uncle and 2 minor children CMS Medicare.gov Compare Post Acute Care list provided to::  (NA) Choice offered to / list presented to : NA [place] ownership interest in [facility].provided to:: Parent NA    Expected Discharge Plan and Services In-house Referral: NA Discharge Planning Services: NA Post Acute Care Choice: NA Living arrangements for the past 2 months: Single Family Home                 DME Arranged: N/A DME Agency: NA       HH Arranged: NA HH Agency: NA        Prior Living Arrangements/Services Living arrangements for the past 2 months: Single Family Home Lives with:: Minor Children, Relatives Patient language and need for interpreter reviewed:: Yes Do you feel safe going back to the place where you live?: Yes      Need for Family Participation in Patient Care: No (Comment) Care giver support system in place?: Yes (comment) Current home services:  (NA) Criminal Activity/Legal Involvement Pertinent to Current Situation/Hospitalization: No - Comment as needed  Activities of Daily  Living   ADL Screening (condition at time of admission) Independently performs ADLs?: Yes (appropriate for developmental age) Is the patient deaf or have difficulty hearing?: No Does the patient have difficulty seeing, even when wearing glasses/contacts?: No Does the patient have difficulty concentrating, remembering, or making decisions?: No  Permission Sought/Granted Permission sought to share information with : Case Manager Permission granted to share information with : Yes, Verbal Permission Granted  Share Information with NAME: Willie sister [phone]           Emotional Assessment Appearance:: Appears stated age Attitude/Demeanor/Rapport: Engaged Affect (typically observed): Appropriate Orientation: : Oriented to Self, Oriented to Place, Oriented to  Time, Oriented to Situation Alcohol / Substance Use: Not Applicable Psych Involvement: No (comment)  Admission diagnosis:  Cellulitis of left lower extremity [L03.116] Sepsis due to cellulitis (HCC) [L03.90, A41.9] Patient Active Problem List   Diagnosis Date Noted   Sepsis due to cellulitis (HCC) 05/12/2024   Hypophosphatemia 05/12/2024   History of intestinal obstruction 05/12/2024   Class 1 obesity 05/12/2024   Pregnancy 12/24/2023   Ankle swelling, right 11/01/2022   Situational anxiety 10/05/2022   Abortion in first trimester 11/05/2021   PCP:  Lucius Krabbe, NP Pharmacy:   [id number] [address], [place] - [address] AT [facility] [address] [place] [address] Phone: [phone] Fax: [phone]     Social Drivers of Health (SDOH) Social History: SDOH Screenings   Food Insecurity: No Food Insecurity (05/12/2024)  Housing: Low Risk  (05/12/2024)  Transportation Needs: No Transportation Needs (05/12/2024)  Utilities: Not At Risk (05/12/2024)  Depression (PHQ2-9): Medium Risk (05/30/2023)  Tobacco Use: Low Risk  (05/12/2024)   SDOH Interventions: Housing  Interventions: Intervention Not Indicated, Inpatient TOC Social Connections Interventions: Intervention Not Indicated, Inpatient TOC Health Literacy Interventions: Intervention Not Indicated, Inpatient TOC   Readmission Risk Interventions    05/14/2024   12:23 PM 05/13/2024   10:29 AM  Readmission Risk Prevention Plan  Post Dischage Appt Complete Complete  Medication Screening Complete Complete  Transportation Screening Complete Complete

## 2024-05-14 NOTE — Progress Notes (Signed)
 PROGRESS NOTE  Kristen Santos FMW:[id number] DOB: 1985/12/21 DOA: 05/12/2024 PCP: Lucius Krabbe, NP   LOS: 2 days   Brief narrative:  Kristen Santos is a 38 y.o. female with medical history significant for class III obesity presented to hospital with tenderness edema and redness of her left knee area after sustaining a fall in her shower 2 days prior to presentation.  Patient was [facility] the [facility] and was given symptomatic treatment and sent home but then again presented to [facility] with fever, chills, fatigue malaise and was found to be tachycardic.  Initial vitals were notable for temperature of 100.7 F with a respiratory rate of 19.  Labs were notable for bacteriuria.  COVID influenza and RSV was negative.  CBC showed mild leukocytosis with WBC of 12.9.  BMP showed potassium of 3.5.  Lactate was within normal range.  Magnesium was 1.7 and phosphorus of 1.7.  Chest x-ray did not show any infiltrate.  Urinalysis was negative for infection.  Patient received clindamycin  and IV fluid bolus and was considered for admission to the hospital for further evaluation and treatment.     Assessment/Plan: Principal Problem:   Sepsis due to cellulitis [facility]) Active Problems:   Hypophosphatemia   Class 1 obesity  Sepsis due to left lower extremity cellulitis  On IV cefazolin  with some improvement in erythema.  Still has tenderness with edema..  Lower extremity ultrasound was negative for DVT.  Blood cultures negative in less than 24 hours.  Temperature max of 98.7 F.  Continue elevation of the lower extremity advised to the patient.  Hemoglobin A1c was 5.7.  No leukocytosis or fever today.    Hypophosphatemia Replenished and improved.  Latest phosphorus of 3.9.  Mild hyponatremia.  Improved, latest sodium of 136.    Class 1 obesity Body mass index is 33.28 kg/m. Patient would benefit from lifestyle modification.  DVT prophylaxis:  enoxaparin  (LOVENOX ) injection 40 mg Start: 05/12/24 2200   Disposition: Home likely on 05/15/2024 if continues to improve.  Status is: Inpatient Remains inpatient appropriate because: IV antibiotic, pending clinical improvement    Code Status:     Code Status: Full Code  Family Communication: None at bedside  Consultants: None  Procedures: None  Anti-infectives:  Cefazolin  IV  Anti-infectives (From admission, onward)    Start     Dose/Rate Route Frequency Ordered Stop   05/12/24 1530  ceFAZolin  (ANCEF ) IVPB 2g/100 mL premix        2 g 200 mL/hr over 30 Minutes Intravenous Every 8 hours 05/12/24 1436     05/12/24 1500  clindamycin  (CLEOCIN ) IVPB 900 mg  Status:  Discontinued        900 mg 100 mL/hr over 30 Minutes Intravenous Every 8 hours 05/12/24 1404 05/12/24 1436   05/12/24 1000  clindamycin  (CLEOCIN ) IVPB 600 mg        600 mg 100 mL/hr over 30 Minutes Intravenous  Once 05/12/24 0959 05/12/24 1120       Subjective: Today, patient was seen and examined at bedside.  Patient complains of less redness of the foot but still complains of swelling and pain.  Denies any shortness of breath fever chills nausea vomiting or diarrhea.   Objective: Vitals:   05/13/24 1953 05/14/24 0528  BP: 127/74 125/78  Pulse: 88 77  Resp: 18 20  Temp: 98.7 F (37.1 C) 98.3 F (36.8 C)  SpO2: 100% 99%    Intake/Output Summary (Last 24 hours) at  05/14/2024 0903 Last data filed at 05/13/2024 1900 Gross per 24 hour  Intake 480 ml  Output --  Net 480 ml   Filed Weights   05/12/24 0910  Weight: 90.7 kg   Body mass index is 33.28 kg/m.   Physical Exam:  GENERAL: Patient is alert awake and oriented. Not in obvious distress.  Obese built HENT: No scleral pallor or icterus. Pupils equally reactive to light. Oral mucosa is moist NECK: is supple, no gross swelling noted. CHEST: Clear to auscultation. No crackles or wheezes.  CVS: S1 and S2 heard, no murmur. Regular rate and  rhythm.  ABDOMEN: Soft, non-tender, bowel sounds are present. EXTREMITIES: Left lower extremity with edema, erythema induration and tenderness of the leg.  Erythema of the leg has decreased in intensity.  No external wounds. CNS: Cranial nerves are intact. No focal motor deficits. SKIN: warm and dry, left lower extremity with erythema edema and tenderness.  Pictures taken 05/13/2024   Data Review: I have personally reviewed the following laboratory data and studies,  CBC: Recent Labs  Lab 05/12/24 0934 05/14/24 0436  WBC 12.9* 6.4  NEUTROABS 10.9*  --   HGB 10.8* 9.9*  HCT 31.9* 31.5*  MCV 82.6 86.1  PLT 288 267   Basic Metabolic Panel: Recent Labs  Lab 05/12/24 0933 05/12/24 0934 05/14/24 0436  NA  --  132* 136  K  --  3.5 3.5  CL  --  100 107  CO2  --  21* 22  GLUCOSE  --  113* 97  BUN  --  6 8  CREATININE  --  0.74 0.59  CALCIUM  --  8.8* 8.4*  MG 1.7  --  2.0  PHOS 1.7*  --  3.9   Liver Function Tests: Recent Labs  Lab 05/12/24 0934  AST 13*  ALT 13  ALKPHOS 67  BILITOT 0.5  PROT 7.0  ALBUMIN 3.7   No results for input(s): LIPASE, AMYLASE in the last 168 hours. No results for input(s): AMMONIA in the last 168 hours. Cardiac Enzymes: No results for input(s): CKTOTAL, CKMB, CKMBINDEX, TROPONINI in the last 168 hours. BNP (last 3 results) No results for input(s): BNP in the last 8760 hours.  ProBNP (last 3 results) No results for input(s): PROBNP in the last 8760 hours.  CBG: No results for input(s): GLUCAP in the last 168 hours. Recent Results (from the past 240 hours)  Resp panel by RT-PCR (RSV, Flu A&B, Covid) Anterior Nasal Swab     Status: None   Collection Time: 05/12/24  9:34 AM   Specimen: Anterior Nasal Swab  Result Value Ref Range Status   SARS Coronavirus 2 by RT PCR NEGATIVE NEGATIVE Final    Comment: (NOTE) SARS-CoV-2 target nucleic acids are NOT DETECTED.  The SARS-CoV-2 RNA is generally detectable in upper  respiratory specimens during the acute phase of infection. The lowest concentration of SARS-CoV-2 viral copies this assay can detect is 138 copies/mL. A negative result does not preclude SARS-Cov-2 infection and should not be used as the sole basis for treatment or other patient management decisions. A negative result may occur with  improper specimen collection/handling, submission of specimen other than nasopharyngeal swab, presence of viral mutation(s) within the areas targeted by this assay, and inadequate number of viral copies(<138 copies/mL). A negative result must be combined with clinical observations, patient history, and epidemiological information. The expected result is Negative.  Fact Sheet for Patients:  BloggerCourse.com  Fact Sheet for Healthcare Providers:  SeriousBroker.it  This test is no t yet approved or cleared by the [place]  FDA and  has been authorized for detection and/or diagnosis of SARS-CoV-2 by FDA under an Emergency Use Authorization (EUA). This EUA will remain  in effect (meaning this test can be used) for the duration of the COVID-19 declaration under Section 564(b)(1) of the Act, 21 U.S.C.section 360bbb-3(b)(1), unless the authorization is terminated  or revoked sooner.       Influenza A by PCR NEGATIVE NEGATIVE Final   Influenza B by PCR NEGATIVE NEGATIVE Final    Comment: (NOTE) The Xpert Xpress SARS-CoV-2/FLU/RSV plus assay is intended as an aid in the diagnosis of influenza from Nasopharyngeal swab specimens and should not be used as a sole basis for treatment. Nasal washings and aspirates are unacceptable for Xpert Xpress SARS-CoV-2/FLU/RSV testing.  Fact Sheet for Patients: BloggerCourse.com  Fact Sheet for Healthcare Providers: SeriousBroker.it  This test is not yet approved or cleared by the [place]  FDA and has been  authorized for detection and/or diagnosis of SARS-CoV-2 by FDA under an Emergency Use Authorization (EUA). This EUA will remain in effect (meaning this test can be used) for the duration of the COVID-19 declaration under Section 564(b)(1) of the Act, 21 U.S.C. section 360bbb-3(b)(1), unless the authorization is terminated or revoked.     Resp Syncytial Virus by PCR NEGATIVE NEGATIVE Final    Comment: (NOTE) Fact Sheet for Patients: BloggerCourse.com  Fact Sheet for Healthcare Providers: SeriousBroker.it  This test is not yet approved or cleared by the [place]  FDA and has been authorized for detection and/or diagnosis of SARS-CoV-2 by FDA under an Emergency Use Authorization (EUA). This EUA will remain in effect (meaning this test can be used) for the duration of the COVID-19 declaration under Section 564(b)(1) of the Act, 21 U.S.C. section 360bbb-3(b)(1), unless the authorization is terminated or revoked.  Performed at [facility], [address] [place], [place] [address]   Blood Culture (routine x 2)     Status: None (Preliminary result)   Collection Time: 05/12/24  9:34 AM   Specimen: BLOOD RIGHT FOREARM  Result Value Ref Range Status   Specimen Description   Final    BLOOD RIGHT FOREARM Performed at [facility], [address] [place], [place] [address]    Special Requests   Final    BOTTLES DRAWN AEROBIC AND ANAEROBIC Blood Culture adequate volume Performed at [facility], [address] [place], [place] [address]    Culture   Final    NO GROWTH < 24 HOURS Performed at [facility], [address] [place], [place] [address]    Report Status PENDING  Incomplete  Blood Culture (routine x 2)     Status: None (Preliminary result)   Collection Time: 05/12/24  9:34 AM   Specimen: BLOOD  Result Value Ref Range Status   Specimen Description   Final    BLOOD RIGHT  ANTECUBITAL Performed at [facility], [address] [place], [place] [address]    Special Requests   Final    BOTTLES DRAWN AEROBIC AND ANAEROBIC Blood Culture adequate volume Performed at [facility], [address] [place], [place] [address]    Culture   Final    NO GROWTH < 24 HOURS Performed at [facility], [address] [place], [place] [address]    Report Status PENDING  Incomplete  Studies: VAS US  LOWER EXTREMITY VENOUS (DVT) Result Date: 05/13/2024  Lower Venous DVT Study Patient Name:  BEXLEIGH THERIAULT  Date of Exam:   05/13/2024 Medical Rec #: [id number]                 Accession #:    [id number] Date of Birth: 05/02/1986                 Patient Gender: F Patient Age:   75 years Exam Location:  [facility] Procedure:      VAS US  LOWER EXTREMITY VENOUS (DVT) Referring Phys: DAVID ORTIZ --------------------------------------------------------------------------------  Indications: Swelling, and Edema.  Limitations: Body habitus and pt unable to tolerate compressions. Performing Technologist: Elmarie Lindau, RVT  Examination Guidelines: A complete evaluation includes B-mode imaging, spectral Doppler, color Doppler, and power Doppler as needed of all accessible portions of each vessel. Bilateral testing is considered an integral part of a complete examination. Limited examinations for reoccurring indications may be performed as noted. The reflux portion of the exam is performed with the patient in reverse Trendelenburg.  +-----+---------------+---------+-----------+----------+--------------+ RIGHTCompressibilityPhasicitySpontaneityPropertiesThrombus Aging +-----+---------------+---------+-----------+----------+--------------+ CFV  Full           Yes      Yes                                 +-----+---------------+---------+-----------+----------+--------------+    +---------+---------------+---------+-----------+----------+--------------+ LEFT     CompressibilityPhasicitySpontaneityPropertiesThrombus Aging +---------+---------------+---------+-----------+----------+--------------+ CFV      Full           Yes      Yes                                 +---------+---------------+---------+-----------+----------+--------------+ SFJ      Full                                                        +---------+---------------+---------+-----------+----------+--------------+ FV Prox  Full                                                        +---------+---------------+---------+-----------+----------+--------------+ FV Mid                  Yes                                          +---------+---------------+---------+-----------+----------+--------------+ FV Distal               Yes                                          +---------+---------------+---------+-----------+----------+--------------+ PFV      Full                                                        +---------+---------------+---------+-----------+----------+--------------+  POP      Full           Yes      Yes                                 +---------+---------------+---------+-----------+----------+--------------+ PTV      Full                                                        +---------+---------------+---------+-----------+----------+--------------+ PERO     Full                                                        +---------+---------------+---------+-----------+----------+--------------+ There is a heterogeneous, vascularized area in the left groin measuring 3.12 x 1.21 cm. A palpable area at the medial below knee/prox calf area measures 3.09 x 1.10 cm.    Summary: RIGHT: - No evidence of common femoral vein obstruction.   LEFT: - There is no evidence of deep vein thrombosis in the lower extremity.  - No cystic structure found in the  popliteal fossa. - There is a heterogeneous, vascularized area in the left groin measuring 3.12 x 1.21 cm. A palpable area at the medial below knee/prox calf area measures 3.09 x 1.10 cm.  *See table(s) above for measurements and observations.    Preliminary    DG Chest Port 1 View Result Date: 05/12/2024 CLINICAL DATA:  Questionable sepsis. EXAM: PORTABLE CHEST 1 VIEW COMPARISON:  03/05/2021 FINDINGS: The lungs are clear without focal pneumonia, edema, pneumothorax or pleural effusion. The cardiopericardial silhouette is within normal limits for size. No acute bony abnormality. IMPRESSION: No active disease. Electronically Signed   By: Camellia Candle M.D.   On: 05/12/2024 10:34      Vernal Alstrom, MD  [facility] 05/14/2024  If 7PM-7AM, please contact night-coverage

## 2024-05-14 NOTE — Evaluation (Signed)
 Physical Therapy Evaluation Patient Details Name: Kristen Santos MRN: [id number] DOB: 1986/08/19 Today's Date: 05/14/2024  History of Present Illness  38 y.o. female with medical history significant for class III obesity presented to hospital with tenderness edema and redness of her left knee area after sustaining a fall in her shower 2 days prior to presentation. Dx of sepsis, cellulitis.  Clinical Impression  Pt admitted with above diagnosis. Pt ambulated 110' with RW, distance limited by 9/10 LLE pain, pt not able to fully weightbear on LLE 2* pain, no loss of balance. Good progress expected.  Pt currently with functional limitations due to the deficits listed below (see PT Problem List). Pt will benefit from acute skilled PT to increase their independence and safety with mobility to allow discharge.           If plan is discharge home, recommend the following: Assistance with cooking/housework;Assist for transportation;Help with stairs or ramp for entrance   Can travel by private vehicle        Equipment Recommendations Rolling walker (2 wheels)  Recommendations for Other Services       Functional Status Assessment Patient has had a recent decline in their functional status and demonstrates the ability to make significant improvements in function in a reasonable and predictable amount of time.     Precautions / Restrictions Precautions Precautions: Fall Recall of Precautions/Restrictions: Intact Restrictions Weight Bearing Restrictions Per Provider Order: No      Mobility  Bed Mobility Overal bed mobility: Modified Independent             General bed mobility comments: HOB up, used rail    Transfers Overall transfer level: Needs assistance Equipment used: Rolling walker (2 wheels) Transfers: Sit to/from Stand Sit to Stand: Supervision           General transfer comment: VCs hand placement    Ambulation/Gait Ambulation/Gait assistance:  Supervision Gait Distance (Feet): 110 Feet Assistive device: Rolling walker (2 wheels) Gait Pattern/deviations: Step-through pattern, Decreased stride length, Decreased weight shift to left Gait velocity: decr     General Gait Details: steady, no loss of balance, decr weight shift to LLE 2* pain  Stairs            Wheelchair Mobility     Tilt Bed    Modified Rankin (Stroke Patients Only)       Balance Overall balance assessment: Modified Independent                                           Pertinent Vitals/Pain Pain Assessment Pain Assessment: 0-10 Pain Score: 9  Pain Location: LLE with walking Pain Descriptors / Indicators: Tightness, Pressure Pain Intervention(s): Limited activity within patient's tolerance, Monitored during session, Repositioned, Patient requesting pain meds-RN notified    Home Living Family/patient expects to be discharged to:: Private residence Living Arrangements: Children;Other (Comment) Available Help at Discharge: Family Type of Home: Apartment Home Access: Stairs to enter   Entrance Stairs-Number of Steps: 1   Home Layout: One level Home Equipment: None Additional Comments: lives with 47 y.o. grandmother and 2 children ages 24 and 4 months.    Prior Function Prior Level of Function : Independent/Modified Independent             Mobility Comments: walks without AD ADLs Comments: independent     Extremity/Trunk Assessment   Upper Extremity Assessment Upper Extremity  Assessment: Overall WFL for tasks assessed    Lower Extremity Assessment Lower Extremity Assessment: LLE deficits/detail LLE Deficits / Details: LLE skin shiny and taut in appearance; pain with movement and weightbearing of LLE limited full assessment. Pt able to ambulate with RW without loss of balance, no buckling of LLE, pt reports she was not putting full weight on her LLE 2* 9/10 pain. LLE: Unable to fully assess due to pain LLE  Sensation: WNL    Cervical / Trunk Assessment Cervical / Trunk Assessment: Normal  Communication   Communication Communication: No apparent difficulties    Cognition Arousal: Alert Behavior During Therapy: WFL for tasks assessed/performed   PT - Cognitive impairments: No apparent impairments                         Following commands: Intact       Cueing       General Comments      Exercises     Assessment/Plan    PT Assessment Patient needs continued PT services  PT Problem List Decreased activity tolerance;Pain;Decreased mobility       PT Treatment Interventions Gait training;Therapeutic exercise;Functional mobility training;Patient/family education    PT Goals (Current goals can be found in the Care Plan section)  Acute Rehab PT Goals Patient Stated Goal: go home to her 2 daughters (88 years old and 32 months old) PT Goal Formulation: With patient Time For Goal Achievement: 05/28/24 Potential to Achieve Goals: Good    Frequency Min 3X/week     Co-evaluation               AM-PAC PT 6 Clicks Mobility  Outcome Measure Help needed turning from your back to your side while in a flat bed without using bedrails?: None Help needed moving from lying on your back to sitting on the side of a flat bed without using bedrails?: None Help needed moving to and from a bed to a chair (including a wheelchair)?: None Help needed standing up from a chair using your arms (e.g., wheelchair or bedside chair)?: None Help needed to walk in hospital room?: None Help needed climbing 3-5 steps with a railing? : A Little 6 Click Score: 23    End of Session Equipment Utilized During Treatment: Gait belt Activity Tolerance: Patient limited by pain Patient left: in chair;with call bell/phone within reach Nurse Communication: Mobility status PT Visit Diagnosis: Pain;Difficulty in walking, not elsewhere classified (R26.2) Pain - Right/Left: Left Pain - part of body:  Ankle and joints of foot    Time: 1413-1430 PT Time Calculation (min) (ACUTE ONLY): 17 min   Charges:   PT Evaluation $PT Eval Moderate Complexity: 1 Mod   PT General Charges $$ ACUTE PT VISIT: 1 Visit        Sylvan Delon Copp PT 05/14/2024  Acute Rehabilitation Services  Office [phone]

## 2024-05-15 DIAGNOSIS — L0390 Cellulitis, unspecified: Secondary | ICD-10-CM | POA: Diagnosis not present

## 2024-05-15 DIAGNOSIS — A419 Sepsis, unspecified organism: Secondary | ICD-10-CM | POA: Diagnosis not present

## 2024-05-15 MED ORDER — CEPHALEXIN 500 MG PO CAPS: 500.0000 mg | ORAL_CAPSULE | Freq: Two times a day (BID) | ORAL | 0 refills | Status: AC

## 2024-05-15 MED ORDER — IBUPROFEN 600 MG PO TABS: 600.0000 mg | ORAL_TABLET | Freq: Four times a day (QID) | ORAL | 0 refills | Status: DC | PRN

## 2024-05-15 MED ORDER — PANTOPRAZOLE SODIUM 40 MG PO TBEC: 40.0000 mg | DELAYED_RELEASE_TABLET | Freq: Every day | ORAL | 0 refills | Status: AC

## 2024-05-15 NOTE — Progress Notes (Signed)
 AVS reviewed w/ patient who verbalized an understanding. PIV removed by primary nurse. No other questions. Pt's ride enroute. Pt is taking a shower prior to leaving

## 2024-05-15 NOTE — Discharge Summary (Signed)
 Physician Discharge Summary  Kristen Santos FMW:[id number] DOB: 1986-03-30 DOA: 05/12/2024  PCP: Lucius Krabbe, NP  Admit date: 05/12/2024 Discharge date: 05/15/2024  Admitted From: Home  Discharge disposition: Home   Recommendations for Outpatient Follow-Up:   Follow up with your primary care provider in one week.  Check CBC, BMP, magnesium in the next visit  Discharge Diagnosis:   Principal Problem:   Sepsis due to cellulitis [facility]) Active Problems:   Hypophosphatemia   Class 1 obesity  Discharge Condition: Improved.  Diet recommendation: Regular.  Wound care: None.  Code status: Full.   History of Present Illness:   Kristen Santos is a 38 y.o. female with medical history significant for class III obesity presented to hospital with tenderness edema and redness of her left knee area after sustaining a fall in her shower 2 days prior to presentation.  Patient was initially at the [facility] and was given symptomatic treatment and sent home but then again presented to [facility] with fever, chills, fatigue malaise and was found to be tachycardic.  Initial vitals were notable for temperature of 100.7 F with a respiratory rate of 19.  Labs were notable for bacteriuria.  COVID influenza and RSV was negative.  CBC showed mild leukocytosis with WBC of 12.9.  BMP showed potassium of 3.5.  Lactate was within normal range.  Magnesium was 1.7 and phosphorus of 1.7.  Chest x-ray did not show any infiltrate.  Urinalysis was negative for infection.  Patient received clindamycin  and IV fluid bolus and was considered for admission to the hospital for further evaluation and treatment.   Hospital Course:   Following conditions were addressed during hospitalization as listed below,  Sepsis due to left lower extremity cellulitis The hospitalization patient received IV cefazolin  with improvement in her erythema and tenderness still has some  degree of edema.  Elevation of the leg was advised to the patient.  Patient is afebrile without leukocytosis and blood cultures have been negative in 3 days..  Patient is allergic to penicillin  and has tolerated cefazolin  so we will transition to Keflex  500 mg twice daily for 5 days to complete  7 -day course.  Hemoglobin A1c was 5.7.  Advised against shaving to avoid microscopic cuts and cellulitis.  Duplex ultrasound of the left lower extremity was negative for DVT.     Hypophosphatemia Replenished and improved.  Latest phosphorus of 3.9.   Mild hyponatremia.  Improved, latest sodium of 136.     Class 1 obesity Body mass index is 33.28 kg/m. Patient would benefit from lifestyle modification.  Disposition.  At this time, patient is stable for disposition home with outpatient PCP follow-up in 1 week.  Medical Consultants:   None.  Procedures:    None Subjective:   Today, patient was seen and examined at bedside.  States that her pain is there when she tries to weight-bear and gravitate her legs..  Otherwise overall feels better.  No fever chills nausea vomiting or tenderness on touching the area.  Discharge Exam:   Vitals:   05/14/24 1929 05/15/24 0431  BP: 133/74 112/74  Pulse: 87 72  Resp: 17 16  Temp: 99 F (37.2 C) 98.6 F (37 C)  SpO2: 100% 99%   Vitals:   05/14/24 0528 05/14/24 1146 05/14/24 1929 05/15/24 0431  BP: 125/78 126/75 133/74 112/74  Pulse: 77 82 87 72  Resp: 20 20 17 16   Temp: 98.3 F (36.8 C) 98.4 F (36.9 C)  99 F (37.2 C) 98.6 F (37 C)  TempSrc:  Oral Oral Oral  SpO2: 99% 100% 100% 99%  Weight:      Height:       Body mass index is 33.28 kg/m.  General: Alert awake, not in obvious distress, obese built HENT: pupils equally reacting to light,  No scleral pallor or icterus noted. Oral mucosa is moist.  Chest:    Diminished breath sounds bilaterally. No crackles or wheezes.  CVS: S1 &S2 heard. No murmur.  Regular rate and rhythm. Abdomen:  Soft, nontender, nondistended.  Bowel sounds are heard.   Extremities: No cyanosis, clubbing, left lower extremity edema, erythema has significantly improved.  Mild tenderness on palpation..  Peripheral pulses are palpable. Psych: Alert, awake and oriented, normal mood CNS:  No cranial nerve deficits.  Power equal in all extremities.   Skin: Warm and dry.  Left lower extremity edema.  The results of significant diagnostics from this hospitalization (including imaging, microbiology, ancillary and laboratory) are listed below for reference.     Diagnostic Studies:   VAS US  LOWER EXTREMITY VENOUS (DVT) Result Date: 05/14/2024  Lower Venous DVT Study Patient Name:  Kristen Santos  Date of Exam:   05/13/2024 Medical Rec #: [id number]                 Accession #:    [id number] Date of Birth: Jan 11, 1986                 Patient Gender: F Patient Age:   63 years Exam Location:  [facility] Procedure:      VAS US  LOWER EXTREMITY VENOUS (DVT) Referring Phys: DAVID ORTIZ --------------------------------------------------------------------------------  Indications: Swelling, and Edema.  Limitations: Body habitus and pt unable to tolerate compressions. Performing Technologist: Elmarie Lindau, RVT  Examination Guidelines: A complete evaluation includes B-mode imaging, spectral Doppler, color Doppler, and power Doppler as needed of all accessible portions of each vessel. Bilateral testing is considered an integral part of a complete examination. Limited examinations for reoccurring indications may be performed as noted. The reflux portion of the exam is performed with the patient in reverse Trendelenburg.  +-----+---------------+---------+-----------+----------+--------------+ RIGHTCompressibilityPhasicitySpontaneityPropertiesThrombus Aging +-----+---------------+---------+-----------+----------+--------------+ CFV  Full           Yes      Yes                                  +-----+---------------+---------+-----------+----------+--------------+   +---------+---------------+---------+-----------+----------+--------------+ LEFT     CompressibilityPhasicitySpontaneityPropertiesThrombus Aging +---------+---------------+---------+-----------+----------+--------------+ CFV      Full           Yes      Yes                                 +---------+---------------+---------+-----------+----------+--------------+ SFJ      Full                                                        +---------+---------------+---------+-----------+----------+--------------+ FV Prox  Full                                                        +---------+---------------+---------+-----------+----------+--------------+  FV Mid                  Yes                                          +---------+---------------+---------+-----------+----------+--------------+ FV Distal               Yes                                          +---------+---------------+---------+-----------+----------+--------------+ PFV      Full                                                        +---------+---------------+---------+-----------+----------+--------------+ POP      Full           Yes      Yes                                 +---------+---------------+---------+-----------+----------+--------------+ PTV      Full                                                        +---------+---------------+---------+-----------+----------+--------------+ PERO     Full                                                        +---------+---------------+---------+-----------+----------+--------------+ There is a heterogeneous, vascularized area in the left groin measuring 3.12 x 1.21 cm. A palpable area at the medial below knee/prox calf area measures 3.09 x 1.10 cm.   Summary: RIGHT: - No evidence of common femoral vein obstruction.   LEFT: - There is no evidence of deep  vein thrombosis in the lower extremity.  - No cystic structure found in the popliteal fossa. - There is a heterogeneous, vascularized area in the left groin measuring 3.12 x 1.21 cm. A palpable area at the medial below knee/prox calf area measures 3.09 x 1.10 cm.  *See table(s) above for measurements and observations. Electronically signed by Fonda Rim on 05/14/2024 at 2:57:54 PM.    Final    DG Chest Port 1 View Result Date: 05/12/2024 CLINICAL DATA:  Questionable sepsis. EXAM: PORTABLE CHEST 1 VIEW COMPARISON:  03/05/2021 FINDINGS: The lungs are clear without focal pneumonia, edema, pneumothorax or pleural effusion. The cardiopericardial silhouette is within normal limits for size. No acute bony abnormality. IMPRESSION: No active disease. Electronically Signed   By: Camellia Candle M.D.   On: 05/12/2024 10:34     Labs:   Basic Metabolic Panel: Recent Labs  Lab 05/12/24 0933 05/12/24 0934 05/14/24 0436  NA  --  132* 136  K  --  3.5 3.5  CL  --  100 107  CO2  --  21* 22  GLUCOSE  --  113* 97  BUN  --  6 8  CREATININE  --  0.74 0.59  CALCIUM  --  8.8* 8.4*  MG 1.7  --  2.0  PHOS 1.7*  --  3.9   GFR Estimated Creatinine Clearance: 108.2 mL/min (by C-G formula based on SCr of 0.59 mg/dL). Liver Function Tests: Recent Labs  Lab 05/12/24 0934  AST 13*  ALT 13  ALKPHOS 67  BILITOT 0.5  PROT 7.0  ALBUMIN 3.7   No results for input(s): LIPASE, AMYLASE in the last 168 hours. No results for input(s): AMMONIA in the last 168 hours. Coagulation profile Recent Labs  Lab 05/12/24 0934  INR 1.2    CBC: Recent Labs  Lab 05/12/24 0934 05/14/24 0436  WBC 12.9* 6.4  NEUTROABS 10.9*  --   HGB 10.8* 9.9*  HCT 31.9* 31.5*  MCV 82.6 86.1  PLT 288 267   Cardiac Enzymes: No results for input(s): CKTOTAL, CKMB, CKMBINDEX, TROPONINI in the last 168 hours. BNP: Invalid input(s): POCBNP CBG: No results for input(s): GLUCAP in the last 168 hours. D-Dimer No  results for input(s): DDIMER in the last 72 hours. Hgb A1c Recent Labs    05/13/24 0414  HGBA1C 5.7*   Lipid Profile No results for input(s): CHOL, HDL, LDLCALC, TRIG, CHOLHDL, LDLDIRECT in the last 72 hours. Thyroid function studies No results for input(s): TSH, T4TOTAL, T3FREE, THYROIDAB in the last 72 hours.  Invalid input(s): FREET3 Anemia work up No results for input(s): VITAMINB12, FOLATE, FERRITIN, TIBC, IRON, RETICCTPCT in the last 72 hours. Microbiology Recent Results (from the past 240 hours)  Resp panel by RT-PCR (RSV, Flu A&B, Covid) Anterior Nasal Swab     Status: None   Collection Time: 05/12/24  9:34 AM   Specimen: Anterior Nasal Swab  Result Value Ref Range Status   SARS Coronavirus 2 by RT PCR NEGATIVE NEGATIVE Final    Comment: (NOTE) SARS-CoV-2 target nucleic acids are NOT DETECTED.  The SARS-CoV-2 RNA is generally detectable in upper respiratory specimens during the acute phase of infection. The lowest concentration of SARS-CoV-2 viral copies this assay can detect is 138 copies/mL. A negative result does not preclude SARS-Cov-2 infection and should not be used as the sole basis for treatment or other patient management decisions. A negative result may occur with  improper specimen collection/handling, submission of specimen other than nasopharyngeal swab, presence of viral mutation(s) within the areas targeted by this assay, and inadequate number of viral copies(<138 copies/mL). A negative result must be combined with clinical observations, patient history, and epidemiological information. The expected result is Negative.  Fact Sheet for Patients:  BloggerCourse.com  Fact Sheet for Healthcare Providers:  SeriousBroker.it  This test is no t yet approved or cleared by the [place]  FDA and  has been authorized for detection and/or diagnosis of SARS-CoV-2 by FDA  under an Emergency Use Authorization (EUA). This EUA will remain  in effect (meaning this test can be used) for the duration of the COVID-19 declaration under Section 564(b)(1) of the Act, 21 U.S.C.section 360bbb-3(b)(1), unless the authorization is terminated  or revoked sooner.       Influenza A by PCR NEGATIVE NEGATIVE Final   Influenza B by PCR NEGATIVE NEGATIVE Final    Comment: (NOTE) The Xpert Xpress SARS-CoV-2/FLU/RSV plus assay is intended as an aid in the diagnosis of influenza from Nasopharyngeal swab specimens and should not be used as a sole basis for treatment. Nasal washings and  aspirates are unacceptable for Xpert Xpress SARS-CoV-2/FLU/RSV testing.  Fact Sheet for Patients: BloggerCourse.com  Fact Sheet for Healthcare Providers: SeriousBroker.it  This test is not yet approved or cleared by the [place]  FDA and has been authorized for detection and/or diagnosis of SARS-CoV-2 by FDA under an Emergency Use Authorization (EUA). This EUA will remain in effect (meaning this test can be used) for the duration of the COVID-19 declaration under Section 564(b)(1) of the Act, 21 U.S.C. section 360bbb-3(b)(1), unless the authorization is terminated or revoked.     Resp Syncytial Virus by PCR NEGATIVE NEGATIVE Final    Comment: (NOTE) Fact Sheet for Patients: BloggerCourse.com  Fact Sheet for Healthcare Providers: SeriousBroker.it  This test is not yet approved or cleared by the [place]  FDA and has been authorized for detection and/or diagnosis of SARS-CoV-2 by FDA under an Emergency Use Authorization (EUA). This EUA will remain in effect (meaning this test can be used) for the duration of the COVID-19 declaration under Section 564(b)(1) of the Act, 21 U.S.C. section 360bbb-3(b)(1), unless the authorization is terminated or revoked.  Performed at [facility], [address] [place], [place] [address]   Blood Culture (routine x 2)     Status: None (Preliminary result)   Collection Time: 05/12/24  9:34 AM   Specimen: BLOOD RIGHT FOREARM  Result Value Ref Range Status   Specimen Description   Final    BLOOD RIGHT FOREARM Performed at [facility], [address] [place], [place] [address]    Special Requests   Final    BOTTLES DRAWN AEROBIC AND ANAEROBIC Blood Culture adequate volume Performed at [facility], [address] [place], [place] [address]    Culture   Final    NO GROWTH 3 DAYS Performed at [facility], [address] [place], [place] [address]    Report Status PENDING  Incomplete  Blood Culture (routine x 2)     Status: None (Preliminary result)   Collection Time: 05/12/24  9:34 AM   Specimen: BLOOD  Result Value Ref Range Status   Specimen Description   Final    BLOOD RIGHT ANTECUBITAL Performed at [facility], [address] [place], [place] [address]    Special Requests   Final    BOTTLES DRAWN AEROBIC AND ANAEROBIC Blood Culture adequate volume Performed at [facility], [address] [place], [place] [address]    Culture   Final    NO GROWTH 3 DAYS Performed at [facility], [address] [place], [place] [address]    Report Status PENDING  Incomplete     Discharge Instructions:   Discharge Instructions     Call MD for:  redness, tenderness, or signs of infection (pain, swelling, redness, odor or green/yellow discharge around incision site)   Complete by: As directed    Call MD for:  severe uncontrolled pain   Complete by: As directed    Call MD for:  temperature >100.4   Complete by: As directed    Diet general   Complete by: As directed    Discharge instructions   Complete by: As directed    Follow up with your primary care provider in one week. Complete the course of antibiotics. Seek medical attention for worsening  symptoms. Keep the leg elevated when sitting down and lying.   Increase activity slowly   Complete by: As directed  Allergies as of 05/15/2024       Reactions   Penicillins Shortness Of Breath, Itching, Other (See Comments)   Has patient had a PCN reaction causing immediate rash, facial/tongue/throat swelling, SOB or lightheadedness with hypotension: Yes Has patient had a PCN reaction causing severe rash involving mucus membranes or skin necrosis: No Has patient had a PCN reaction that required hospitalization No Has patient had a PCN reaction occurring within the last 10 years: Yes If all of the above answers are NO, then may proceed with Cephalosporin use.        Medication List     TAKE these medications    acetaminophen  500 MG tablet Commonly known as: TYLENOL  Take 500 mg by mouth every 6 (six) hours as needed for moderate pain (pain score 4-6).   cephALEXin  500 MG capsule Commonly known as: KEFLEX  Take 1 capsule (500 mg total) by mouth 2 (two) times daily for 5 days.   ibuprofen  600 MG tablet Commonly known as: ADVIL  Take 1 tablet (600 mg total) by mouth every 6 (six) hours as needed for moderate pain (pain score 4-6) or mild pain (pain score 1-3).   medroxyPROGESTERone 150 MG/ML injection Commonly known as: DEPO-PROVERA Inject 150 mg into the muscle every 3 (three) months.   pantoprazole  40 MG tablet Commonly known as: Protonix  Take 1 tablet (40 mg total) by mouth daily for 14 days. Notes to patient: Take on an empty stomach w/ 8 oz of water , 30 minutes before eating breakfast               Durable Medical Equipment  (From admission, onward)           Start     Ordered   05/14/24 1522  For home use only DME Walker rolling  Once       Question Answer Comment  Walker: With 5 Inch Wheels   Patient needs a walker to treat with the following condition General weakness      05/14/24 1522            Follow-up Information     [facility]  Follow up.   Why: Will deliver to patients room prior to discharge. Contact information: [facility] [address],  [place], [place] [address]  [phone]        Lucius Krabbe, NP Follow up in 1 week(s).   Specialty: Family Medicine Contact information: [address] [place] [address] [phone]                  Time coordinating discharge: 39 minutes  Signed:  Makaley Storts  [facility] 05/15/2024, 3:03 PM

## 2024-05-15 NOTE — Plan of Care (Signed)
  Problem: Education: Goal: Knowledge of General Education information will improve Description: Including pain rating scale, medication(s)/side effects and non-pharmacologic comfort measures Outcome: Progressing   Problem: Clinical Measurements: Goal: Ability to maintain clinical measurements within normal limits will improve Outcome: Progressing   Problem: Activity: Goal: Risk for activity intolerance will decrease Outcome: Progressing   Problem: Coping: Goal: Level of anxiety will decrease Outcome: Progressing   Problem: Pain Managment: Goal: General experience of comfort will improve and/or be controlled Outcome: Progressing   Problem: Safety: Goal: Ability to remain free from injury will improve Outcome: Progressing   Problem: Clinical Measurements: Goal: Ability to avoid or minimize complications of infection will improve Outcome: Progressing

## 2024-05-16 ENCOUNTER — Telehealth: Payer: Self-pay

## 2024-05-16 NOTE — Transitions of Care (Post Inpatient/ED Visit) (Signed)
 05/16/2024  Name: Kristen Santos MRN: [id number] DOB: 24-Jun-1986  Today's TOC FU Call Status: Today's TOC FU Call Status:: Successful TOC FU Call Completed TOC FU Call Complete Date: 05/16/24 Patient's Name and Date of Birth confirmed.  Transition Care Management Follow-up Telephone Call Date of Discharge: 05/15/24 Discharge Facility: [facility]) Type of Discharge: Inpatient Admission Primary Inpatient Discharge Diagnosis:: sepsis due to cellulitis How have you been since you were released from the hospital?: Better (patient states she feels she is a little better. She reports her leg is still swollen and hurts however the redness is going away and the leg is not hot to touch.) Any questions or concerns?: Yes Patient Questions/Concerns:: Patient states left leg is still swollen so her major concern is getting the inflammation out. Assisted patient with arranging hospital follow up visit.  Patient states she told the doctor in the hospital she could probably go back to work on Thursday 05/17/24 however she doesn't think that will for since she is still having symptoms.  Advised patient to send message through Mychart to her primary care provider explaining situation and asking for a work excuse extension until she is seen in the office on 05/23/24. Patiet verbalized understanding and agreement. Patient Questions/Concerns Addressed: Other: (patients concerns were discussed and addressed.)  Items Reviewed: Did you receive and understand the discharge instructions provided?: Yes Medications obtained,verified, and reconciled?: Yes (Medications Reviewed) Any new allergies since your discharge?: No Dietary orders reviewed?: Yes Type of Diet Ordered:: regular Do you have support at home?: Yes People in Home [RPT]: sibling(s) Name of Support/Comfort Primary Source: Kristen Santos  Medications Reviewed Today: Medications Reviewed Today     Reviewed by Oaklen Thiam E, RN  (Registered Nurse) on 05/16/24 at 1041  Med List Status: <None>   Medication Order Taking? Sig Documenting Provider Last Dose Status Informant  acetaminophen  (TYLENOL ) 500 MG tablet [id number] Yes Take 500 mg by mouth every 6 (six) hours as needed for moderate pain (pain score 4-6). [provider]  Active Self  cephALEXin  (KEFLEX ) 500 MG capsule [id number] Yes Take 1 capsule (500 mg total) by mouth 2 (two) times daily for 5 days. Pokhrel, Laxman, MD  Active   ibuprofen  (ADVIL ) 600 MG tablet [id number] Yes Take 1 tablet (600 mg total) by mouth every 6 (six) hours as needed for moderate pain (pain score 4-6) or mild pain (pain score 1-3). Pokhrel, Vernal, MD  Active   medroxyPROGESTERone (DEPO-PROVERA) 150 MG/ML injection [id number] Yes Inject 150 mg into the muscle every 3 (three) months. [provider]  Active Self  pantoprazole  (PROTONIX ) 40 MG tablet [id number] Yes Take 1 tablet (40 mg total) by mouth daily for 14 days. Pokhrel, Laxman, MD  Active             Home Care and Equipment/Supplies: Were Home Health Services Ordered?: No Any new equipment or medical supplies ordered?: Yes Were you able to get the equipment/medical supplies?: Yes Do you have any questions related to the use of the equipment/supplies?: Yes What questions do you have?: patient states she was ordered a walker that was delivered to her hospital room. She states she forgot to bring the walker home. She states she doesn't feel she needs it. Advised her to call the hospital and speak with someone on the floor she was discharged from about getting the walker. Explained to patient she would still be charged or her insurance will be charged for the walker. Confirmed patient has contact phone number  to the hospital  Functional Questionnaire: Do you need assistance with bathing/showering or dressing?: No Do you need assistance with meal preparation?: No Do you need assistance with eating?: No Do you have  difficulty maintaining continence: No Do you need assistance with getting out of bed/getting out of a chair/moving?: No Do you have difficulty managing or taking your medications?: No  Follow up appointments reviewed: PCP Follow-up appointment confirmed?: Yes Date of PCP follow-up appointment?: 05/23/24 Follow-up Provider: Corean Comment, NP Specialist Hospital Follow-up appointment confirmed?: NA Do you need transportation to your follow-up appointment?: No Do you understand care options if your condition(s) worsen?: Yes-patient verbalized understanding  SDOH Interventions Today    Flowsheet Row Most Recent Value  SDOH Interventions   Food Insecurity Interventions Intervention Not Indicated  Housing Interventions Intervention Not Indicated  Transportation Interventions Intervention Not Indicated  Utilities Interventions Intervention Not Indicated    Discussed and offered 30 day TOC program.  Patient  declined.  The patient has been provided with contact information for the care management team and has been advised to call with any health -related questions or concerns.  The patient verbalized understanding with current plan of care.  The patient is directed to their insurance card regarding availability of benefits coverage.    Arvin Seip RN, BSN, CCM [employer], Population Health Case Manager Phone: [phone]

## 2024-05-16 NOTE — Patient Instructions (Signed)
 Visit Information  Thank you for taking time to visit with me today. Please don't hesitate to contact me if I can be of assistance to you   Patient instructions:  Take medications as prescribed Keep follow up appointments with provider Elevate leg when sitting or lying down Notify provider for worsening symptoms and / or if pain is unrelieved by current pain medications Contact your provider to discuss return to work concerns    Patient verbalizes understanding of instructions and care plan provided today and agrees to view in MyChart. Active MyChart status and patient understanding of how to access instructions and care plan via MyChart confirmed with patient.     The patient has been provided with contact information for the care management team and has been advised to call with any health related questions or concerns.   Please call the care guide team at [phone] if you need to cancel or reschedule your appointment.   Please call the Suicide and Crisis Lifeline: 988 call the [place]  National Suicide Prevention Lifeline: (724) 067-5042 or TTY: 432-427-8324 TTY 657-834-6388) to talk to a trained counselor call 1-800-273-TALK (toll free, 24 hour hotline) go to [facility] [address] [place] [phone]) if you are experiencing a Mental Health or Behavioral Health Crisis or need someone to talk to.  Arvin Seip RN, BSN, CCM [employer], Population Health Case Manager Phone: [phone]

## 2024-05-18 LAB — CULTURE, BLOOD (ROUTINE X 2)
Culture: NO GROWTH
Culture: NO GROWTH
Special Requests: ADEQUATE
Special Requests: ADEQUATE

## 2024-05-23 ENCOUNTER — Ambulatory Visit: Admitting: Family

## 2024-05-23 ENCOUNTER — Telehealth: Payer: Self-pay

## 2024-05-23 ENCOUNTER — Encounter: Payer: Self-pay | Admitting: Family

## 2024-05-23 VITALS — BP 115/77 | HR 77 | Temp 98.4°F | Ht 65.0 in | Wt 252.4 lb

## 2024-05-23 DIAGNOSIS — M7989 Other specified soft tissue disorders: Secondary | ICD-10-CM | POA: Diagnosis not present

## 2024-05-23 DIAGNOSIS — D500 Iron deficiency anemia secondary to blood loss (chronic): Secondary | ICD-10-CM | POA: Diagnosis not present

## 2024-05-23 DIAGNOSIS — L03116 Cellulitis of left lower limb: Secondary | ICD-10-CM

## 2024-05-23 DIAGNOSIS — A5900 Urogenital trichomoniasis, unspecified: Secondary | ICD-10-CM | POA: Insufficient documentation

## 2024-05-23 DIAGNOSIS — O09529 Supervision of elderly multigravida, unspecified trimester: Secondary | ICD-10-CM | POA: Insufficient documentation

## 2024-05-23 MED ORDER — FERROUS SULFATE ER 50 MG PO TBCR: 1.0000 | EXTENDED_RELEASE_TABLET | Freq: Every day | ORAL | 11 refills | Status: DC

## 2024-05-23 MED ORDER — FERROUS SULFATE ER 50 MG PO TBCR: 1.0000 | EXTENDED_RELEASE_TABLET | Freq: Every day | ORAL | 11 refills | Status: AC

## 2024-05-23 MED ORDER — HYDROCHLOROTHIAZIDE 12.5 MG PO TABS: 12.5000 mg | ORAL_TABLET | Freq: Every day | ORAL | 1 refills | Status: AC
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
## 2024-05-23 NOTE — Telephone Encounter (Signed)
 I called and spoke with pt in regards to PCP recommendations. Pt states the hospital did not discuss anemia. Pt verbalized understanding, no questions or concerns at this time.
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
## 2024-07-09 ENCOUNTER — Encounter (HOSPITAL_COMMUNITY): Payer: Self-pay

## 2024-07-09 ENCOUNTER — Other Ambulatory Visit: Payer: Self-pay

## 2024-07-09 ENCOUNTER — Emergency Department (HOSPITAL_COMMUNITY)
Admission: EM | Admit: 2024-07-09 | Discharge: 2024-07-10 | Discharge status: Against Medical Advice | Attending: Emergency Medicine | Admitting: Emergency Medicine

## 2024-07-09 DIAGNOSIS — Z5321 Procedure and treatment not carried out due to patient leaving prior to being seen by health care provider: Secondary | ICD-10-CM | POA: Insufficient documentation

## 2024-07-09 DIAGNOSIS — M79605 Pain in left leg: Secondary | ICD-10-CM | POA: Insufficient documentation

## 2024-07-09 DIAGNOSIS — M7989 Other specified soft tissue disorders: Secondary | ICD-10-CM | POA: Insufficient documentation

## 2024-07-09 DIAGNOSIS — L03116 Cellulitis of left lower limb: Secondary | ICD-10-CM | POA: Diagnosis not present

## 2024-07-09 NOTE — ED Triage Notes (Signed)
 Pt from work via POV.  Pt reports left leg swelling starting in August caused by an infection.  Pt states swelling never decreased.  Pt states pain increased three days ago prompting her to return.

## 2024-07-09 NOTE — ED Provider Triage Note (Addendum)
 Emergency Medicine Provider Triage Evaluation Note  Kristen Santos , a 38 y.o. female  was evaluated in triage.  Pt complains of Left leg pain. States that she's had months of leg pain.   States she was admitted in August for cellulitis had -DVT study. Today went to work but states it hurt and came to ER.  No fevers.   Review of Systems  Positive: Leg pain Negative: Fever   Physical Exam  BP 133/76   Pulse 97   Temp 98.8 F (37.1 C) (Oral)   Resp 18   Ht 5' 5 (1.651 m)   Wt 113.4 kg   SpO2 99%   BMI 41.60 kg/m  Gen:   Awake, no distress   Resp:  Normal effort  MSK:   Moves extremities without difficulty  Other:  LLE with some swelling, perfused foot w sensation intact, no wounds  Medical Decision Making  Medically screening exam initiated at 11:07 PM.  Appropriate orders placed.  Bettye Shelina Luo was informed that the remainder of the evaluation will be completed by another provider, this initial triage assessment does not replace that evaluation, and the importance of remaining in the ED until their evaluation is complete.  Reviewed EMR: Left LE DVT study 8/24 showed no DVT but did show hematoma   - There is no evidence of deep vein thrombosis in the lower extremity.    - No cystic structure found in the popliteal fossa.  - There is a heterogeneous, vascularized area in the left groin measuring  3.12 x 1.21 cm. A palpable area at the medial below knee/prox calf area  measures 3.09 x 1.10 cm.    Neldon Hamp RAMAN, [place] 07/09/24 2311  Requested RN obtain blue top as well     Neldon Hamp RAMAN, [place] 07/09/24 2312

## 2024-07-09 NOTE — ED Notes (Signed)
 Blue top send to lab

## 2024-07-10 LAB — COMPREHENSIVE METABOLIC PANEL WITH GFR
ALT: 11 U/L (ref 0–44)
AST: 12 U/L — ABNORMAL LOW (ref 15–41)
Albumin: 3.8 g/dL (ref 3.5–5.0)
Alkaline Phosphatase: 69 U/L (ref 38–126)
Anion gap: 12 (ref 5–15)
BUN: 14 mg/dL (ref 6–20)
CO2: 22 mmol/L (ref 22–32)
Calcium: 9.4 mg/dL (ref 8.9–10.3)
Chloride: 103 mmol/L (ref 98–111)
Creatinine, Ser: 0.77 mg/dL (ref 0.44–1.00)
GFR, Estimated: >60 mL/min (ref >60)
Glucose, Bld: 103 mg/dL — ABNORMAL HIGH (ref 70–99)
Potassium: 4 mmol/L (ref 3.5–5.1)
Sodium: 137 mmol/L (ref 135–145)
Total Bilirubin: 0.2 mg/dL (ref 0.0–1.2)
Total Protein: 7.2 g/dL (ref 6.5–8.1)

## 2024-07-10 LAB — CBC
HCT: 36.6 % (ref 36.0–46.0)
Hemoglobin: 11.3 g/dL — ABNORMAL LOW (ref 12.0–15.0)
MCH: 27.4 pg (ref 26.0–34.0)
MCHC: 30.9 g/dL (ref 30.0–36.0)
MCV: 88.6 fL (ref 80.0–100.0)
Platelets: 363 K/uL (ref 150–400)
RBC: 4.13 MIL/uL (ref 3.87–5.11)
RDW: 15 % (ref 11.5–15.5)
WBC: 6.2 K/uL (ref 4.0–10.5)
nRBC: 0 % (ref 0.0–0.2)

## 2024-07-23 ENCOUNTER — Ambulatory Visit: Admitting: Family

## 2024-07-24 ENCOUNTER — Encounter: Payer: Self-pay | Admitting: Family

## 2024-09-28 ENCOUNTER — Encounter (HOSPITAL_BASED_OUTPATIENT_CLINIC_OR_DEPARTMENT_OTHER): Payer: Self-pay

## 2024-09-28 ENCOUNTER — Emergency Department (HOSPITAL_BASED_OUTPATIENT_CLINIC_OR_DEPARTMENT_OTHER): Admission: EM | Admit: 2024-09-28 | Discharge: 2024-09-28 | Disposition: A | Discharge status: Home / Self Care

## 2024-09-28 ENCOUNTER — Emergency Department (HOSPITAL_BASED_OUTPATIENT_CLINIC_OR_DEPARTMENT_OTHER)

## 2024-09-28 ENCOUNTER — Other Ambulatory Visit: Payer: Self-pay

## 2024-09-28 DIAGNOSIS — M25571 Pain in right ankle and joints of right foot: Secondary | ICD-10-CM | POA: Insufficient documentation

## 2024-09-28 DIAGNOSIS — M546 Pain in thoracic spine: Secondary | ICD-10-CM | POA: Insufficient documentation

## 2024-09-28 DIAGNOSIS — S161XXA Strain of muscle, fascia and tendon at neck level, initial encounter: Secondary | ICD-10-CM | POA: Insufficient documentation

## 2024-09-28 DIAGNOSIS — Y92410 Unspecified street and highway as the place of occurrence of the external cause: Secondary | ICD-10-CM | POA: Insufficient documentation

## 2024-09-28 DIAGNOSIS — S199XXA Unspecified injury of neck, initial encounter: Secondary | ICD-10-CM | POA: Diagnosis present

## 2024-09-28 MED ORDER — METHOCARBAMOL 500 MG PO TABS: 1000.0000 mg | ORAL_TABLET | Freq: Three times a day (TID) | ORAL | 0 refills | Status: AC | PRN

## 2024-09-28 MED ORDER — MELOXICAM 7.5 MG PO TABS: 7.5000 mg | ORAL_TABLET | Freq: Every day | ORAL | 0 refills | Status: AC
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
## 2024-10-08 ENCOUNTER — Inpatient Hospital Stay: Admitting: Family
